# Patient Record
Sex: Male | Born: 1935 | Race: White | Hispanic: No | Marital: Married | Smoking: Former smoker
Health system: Southern US, Community
[De-identification: ages and names within clinical notes are randomized; demographics above are authoritative.]

## PROBLEM LIST (undated history)

## (undated) DIAGNOSIS — J189 Pneumonia, unspecified organism: Secondary | ICD-10-CM

## (undated) DIAGNOSIS — G8929 Other chronic pain: Secondary | ICD-10-CM

## (undated) DIAGNOSIS — M1990 Unspecified osteoarthritis, unspecified site: Secondary | ICD-10-CM

## (undated) DIAGNOSIS — M545 Low back pain, unspecified: Secondary | ICD-10-CM

## (undated) DIAGNOSIS — Z87442 Personal history of urinary calculi: Secondary | ICD-10-CM

## (undated) DIAGNOSIS — F0390 Unspecified dementia without behavioral disturbance: Secondary | ICD-10-CM

## (undated) DIAGNOSIS — Z95810 Presence of automatic (implantable) cardiac defibrillator: Secondary | ICD-10-CM

## (undated) DIAGNOSIS — R3911 Hesitancy of micturition: Secondary | ICD-10-CM

## (undated) DIAGNOSIS — E785 Hyperlipidemia, unspecified: Secondary | ICD-10-CM

## (undated) DIAGNOSIS — I6529 Occlusion and stenosis of unspecified carotid artery: Secondary | ICD-10-CM

## (undated) DIAGNOSIS — I2510 Atherosclerotic heart disease of native coronary artery without angina pectoris: Secondary | ICD-10-CM

## (undated) DIAGNOSIS — K219 Gastro-esophageal reflux disease without esophagitis: Secondary | ICD-10-CM

## (undated) DIAGNOSIS — I10 Essential (primary) hypertension: Secondary | ICD-10-CM

## (undated) HISTORY — PX: CATARACT EXTRACTION, BILATERAL: SHX1313

## (undated) HISTORY — PX: CORONARY ANGIOPLASTY: SHX604

## (undated) HISTORY — PX: CARDIAC CATHETERIZATION: SHX172

## (undated) HISTORY — PX: BACK SURGERY: SHX140

## (undated) HISTORY — PX: KIDNEY STONE SURGERY: SHX686

## (undated) HISTORY — PX: KNEE SURGERY: SHX244

## (undated) HISTORY — DX: Hyperlipidemia, unspecified: E78.5

## (undated) HISTORY — DX: Essential (primary) hypertension: I10

## (undated) HISTORY — PX: CARDIAC PACEMAKER PLACEMENT: SHX583

## (undated) HISTORY — DX: Atherosclerotic heart disease of native coronary artery without angina pectoris: I25.10

## (undated) HISTORY — PX: OTHER SURGICAL HISTORY: SHX169

## (undated) HISTORY — PX: TONSILLECTOMY: SUR1361

## (undated) HISTORY — PX: LUMBAR DISC SURGERY: SHX700

## (undated) HISTORY — DX: Occlusion and stenosis of unspecified carotid artery: I65.29

---

## 1999-03-09 ENCOUNTER — Ambulatory Visit (HOSPITAL_COMMUNITY): Admission: RE | Admit: 1999-03-09 | Discharge: 1999-03-10 | Payer: Self-pay | Admitting: Cardiovascular Disease

## 1999-03-09 HISTORY — PX: CARDIAC CATHETERIZATION: SHX172

## 1999-07-13 ENCOUNTER — Observation Stay (HOSPITAL_COMMUNITY): Admission: RE | Admit: 1999-07-13 | Discharge: 1999-07-14 | Payer: Self-pay | Admitting: Cardiovascular Disease

## 1999-07-13 ENCOUNTER — Encounter: Payer: Self-pay | Admitting: Cardiovascular Disease

## 1999-07-13 HISTORY — PX: OTHER SURGICAL HISTORY: SHX169

## 2003-01-22 ENCOUNTER — Ambulatory Visit (HOSPITAL_COMMUNITY): Admission: RE | Admit: 2003-01-22 | Discharge: 2003-01-23 | Payer: Self-pay | Admitting: Cardiovascular Disease

## 2003-01-22 HISTORY — PX: OTHER SURGICAL HISTORY: SHX169

## 2004-10-20 ENCOUNTER — Ambulatory Visit (HOSPITAL_COMMUNITY): Admission: RE | Admit: 2004-10-20 | Discharge: 2004-10-20 | Payer: Self-pay | Admitting: Cardiovascular Disease

## 2004-10-20 HISTORY — PX: ICD GENERATOR CHANGE: SHX5854

## 2008-03-30 ENCOUNTER — Encounter: Admission: RE | Admit: 2008-03-30 | Discharge: 2008-03-30 | Payer: Self-pay | Admitting: Orthopedic Surgery

## 2008-04-15 ENCOUNTER — Encounter (INDEPENDENT_AMBULATORY_CARE_PROVIDER_SITE_OTHER): Payer: Self-pay | Admitting: Orthopedic Surgery

## 2008-04-15 ENCOUNTER — Ambulatory Visit (HOSPITAL_BASED_OUTPATIENT_CLINIC_OR_DEPARTMENT_OTHER): Admission: RE | Admit: 2008-04-15 | Discharge: 2008-04-15 | Payer: Self-pay | Admitting: Orthopedic Surgery

## 2008-04-17 ENCOUNTER — Emergency Department (HOSPITAL_COMMUNITY): Admission: EM | Admit: 2008-04-17 | Discharge: 2008-04-17 | Payer: Self-pay | Admitting: Emergency Medicine

## 2009-08-16 ENCOUNTER — Encounter: Admission: RE | Admit: 2009-08-16 | Discharge: 2009-08-16 | Payer: Self-pay | Admitting: Cardiovascular Disease

## 2010-04-12 LAB — BASIC METABOLIC PANEL
BUN: 39 mg/dL — ABNORMAL HIGH (ref 6–23)
CO2: 28 mEq/L (ref 19–32)
Calcium: 9.3 mg/dL (ref 8.4–10.5)
GFR calc non Af Amer: 43 mL/min — ABNORMAL LOW (ref >60)
Glucose, Bld: 167 mg/dL — ABNORMAL HIGH (ref 70–99)

## 2010-04-12 LAB — POCT I-STAT 4, (NA,K, GLUC, HGB,HCT): HCT: 46 % (ref 39.0–52.0)

## 2010-05-16 NOTE — Op Note (Signed)
NAMESANDER, MCCRIMON             ACCOUNT NO.:  [id number]   MEDICAL RECORD NO.:  [id number]          PATIENT TYPE:  AMB   LOCATION:  [facility]                         FACILITY:  [facility]   PHYSICIAN:  Tarri Glenn, M.D.  DATE OF BIRTH:  09-21-1934   DATE OF PROCEDURE:  04/15/2008  DATE OF DISCHARGE:                               OPERATIVE REPORT   PREOPERATIVE DIAGNOSES:  1. Large Baker cyst.  2. Suspected tear, medial meniscus.  3. Osteoarthritis, right knee.   POSTOPERATIVE DIAGNOSES:  1. Large Baker cyst.  2. Suspected tear, medial meniscus.  3. Osteoarthritis, right knee.  4. Torn lateral meniscus, right knee.   OPERATION:  Right knee arthroscopy with:  1. Partial medial and lateral meniscectomy.  2. Shaving of medial femoral condyle.  3. Open excision of Baker cyst, right knee.   SURGEON:  Tarri Glenn, M.D.   ASSISTANT:  Nurse.   ANESTHESIA:  General.   PATHOLOGY AND JUSTIFICATION FOR PROCEDURE:  He has a very painful right  knee with plain x-ray showing some medial compartment narrowing.  He  also had a large mass over the posterior medial and medial aspects of  his knee.  He has a pacemaker, so we were unable to get an MRI.  CT scan  demonstrated the mass to be a Baker cyst, and a probable tear of the  medial meniscus.  Consequently, he is here today for the above-mentioned  surgical procedures.   PROCEDURE:  Prophylactic antibiotics, satisfactory general anesthesia,  Ace wrap and knee support to left lower extremity.  Pneumatic tourniquet  applied to right lower extremity with the leg esmarched out  nonsterilely, and tourniquet inflated to 300 mmHg.  Thigh stabilizer  applied, right leg was then prepped from stabilizer to ankle with  DuraPrep and draped in a sterile field.  Superior medial saline inflow.  First through an anteromedial portal, lateral compartment of knee joint  was evaluated.  He had a little bit of reactive synovitis which I  resected with the  3.5 shaver.  Joint surfaces had minimal wear.  He did  have considerable fraying of the entire inner border of the lateral  meniscus, which I pictured and shaved down until smooth with a 3.5  shaver, with final pictures being taken.  Looking up in the lateral  gutter and suprapatellar area, minimal wear of the patella, and nothing  needed to be done arthroscopically.  I then reversed portals.  He had a  moderate amount of synovitis which I resected with 3.5 shaver.  This  allowed better visualization of the joint.  He had full thickness wear  of the posterior medial tibial plateau and grade 2-3/4 chondromalacia of  the medial femoral condyle, which I shaved down with the 3.5 shaver.  He  had a degenerative type tear of the entire posterior half of the medial  meniscus, which I treated with a combination of baskets and a 3.5  shaver, resecting it back to a stable rim.  I then removed all fluid  possible from the knee joint and closed the 2 entry portals with 4-0  nylon.  Through  the inflow apparatus I injected 20 mL of 0.5% Marcaine  with adrenaline and 4 mg of morphine and then closed this portal with 4-  0 nylon as well.  Tourniquet was released.  We then brought stretcher  into the operating room and gently moved him to the stretcher and placed  rolls on the operating room table, and then placed him back on the  operating room table in the prone position.  The tourniquet had been  released this entire time and was reapplied with the nozzle posteriorly,  and we then esmarched out the leg again nonsterilely, and inflated the  tourniquet to 300 mmHg.  We then prepped the right leg from tourniquet  to ankle with DuraPrep and draped in a sterile field.  A time-out was  performed before the first procedure for both procedures.  I then  started out with a small posterior medial incision, and when the cyst  became visible, I had to extend it proximally and distalward to get to  the extent of the  cyst.  Neurovascular structures and tendons were  protected and retracted.  First with sharp dissection and then with  digital dissection, I was able to dissect out the mass from inferiorly,  working superiorly, down to its entrance to the knee joint, and at this  point the mass deflated.  It was clearly a benign cyst.  The apertures  to the knee joint were identified and I closed them with multiple  interrupted 0 Vicryl.  I then continued dissecting the mass out  superiorly from the surrounding tissues, and I found no other exit area  from the knee joint and also found no other additional mass present.  The specimen was sent to pathology.  I then released the tourniquet.  There was no unusual bleeding.  I closed the subcutaneous tissue with a  combination of 0 and 2-0 Vicryl, with interrupted 4-0 nylon mattress  sutures in the skin.  Medium Adaptic dry sterile dressing applied to  both anterior and posterior knee with a well-padded dressing followed by  an Ace wrap.  He was then placed on his back and in a knee immobilizer.  He tolerated the procedure well and was taken to the recovery room in  satisfactory condition with no known complications.           ______________________________  Tarri Glenn, M.D.     JA/MEDQ  D:  04/15/2008  T:  04/15/2008  Job:  [id number]

## 2010-05-19 NOTE — Cardiovascular Report (Signed)
[place]  Patient:    DARYEL, ARROYOS                    MRN: [id number] Proc. Date: 03/09/99 Adm. Date:  [id number] Attending:  Epifanio Lesches CC:         Richard A. Rollene Fare, M.D.             CP Laboratory             Varney Baas, M.D., [facility], [place]                        Cardiac Catheterization  PROCEDURE:  Retrograde central aortic catheterization, selective coronary angiography via Judkins technique, LV angiogram RAO/LAO projection, subselective LIMA/RIMA and left subclavian injection, abdominal aortic angiogram midstream PA projection, atherectomy with IVT cutting balloon high-grade made and proximal RCA stenosis under Aggrastat bolus plus infusion, weight-adjusted heparin and p.o. Plavix.  BRIEF HISTORY:  Mr. 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  He is married with three children and four grandchildren.  He is a medical patient of Dr. Debria Garret and had TVP for sick sinus syndrome with syncope June 28, 1993 ([employer] [facility]). Recent episodes of chest pain compatible with ischemia.  Outpatient Cardiolite positive for inferior ischemia (February 22, 1999) prompting referral for catheterization.  The patient generally overrides his pacemaker set in the LRL of 65 per minute. He stopped smoking one year ago.  He was premedicated as an outpatient with Plavix and has been on chronic aspirin therapy.  The patient is admitted, this is a same day admission, in a post absorptive state. ______ dye was used throughout the procedure.  The above procedure was performed initially through a 6 [place] short Terumo sidearm sheath placed with  single anterior puncture into the RFA under 1% Xylocaine anesthesia. Diagnostic angiography was done with 6 French 4 cm taper of Cordis free form coronary and  pigtail catheters.  Subselective LIMA, RIMA, and  left subclavian injection done  with the right coronary and LV angiogram done at 25 cc/14 cc per second RAO and 20 cc/12 cc per second LAO projection through the pigtail catheter.  Pullback pressures PA showed no gradient across the aortic valve.  Abdominal aortic angiogram in a midstream PA projection was done above the level of the renal arteries at 25 cc/20 cc per second.  The patient tolerated diagnostic procedure well with no chest pain or arrhythmia.  PRESSURES:  LV 108/0; LVP 16 mmHg.  CA:  108/60 mmHg.  There was no gradient across the aortic valve on catheter pullback.  FLUOROSCOPY:  Fluoroscopy did not reveal any significant coronary, intracoronary, for valvular calcification  LV angiogram in the RAO and LAO projection showed mild hypokinesis of the posteroapical segment with otherwise normal and contracting ventricle with the patient in sinus rhythm overriding his pacer and an EF of greater than 50%. There was no significant mitral regurgitation with sinus beats.  The main left coronary was normal.  The left anterior descending had thinning from the mid portion to the apex but o significant stenosis.  I could not rule out atherosclerotic disease in this area but it was non-obstructive.  The left coronary system was relatively small at  2.5 mm size.  The LAD gave off a diagonal from the proximal and from the mid portion of moderate size that were normal.  There was an optional diagonal vessel that was normal of moderate size.  The circumflex gave off a thin first marginal and then the remainder of the vessel was a moderate size with no significant stenosis with an atrial branch and PAVG  branch from the mid portion.  The right coronary was a dominant vessel.  There was a shelf-like stenosis of 75%, fairly focal, in the proximal third just before the bend at the level of a conus branch.  There was a 40% smooth narrowing from the junction of the  proximal third to the mid portion of the vessel, probably representing diffuse atherosclerotic  disease with smooth good residual lumen of approximately 2.7-3.0 mm.  There was a high-grade 95% eccentric stenosis of the RCA just before the acute margin of less than 10 mm in length.  The PDA and PLA were normal, slightly underfilled.  There was TIMI-3 flow to the vessel and no thrombus was seen.  The RIMA and LIMA were widely patent; however, there was a concentric diffuse 75-85% stenosis of the left subclavian artery.  The left renal artery had 60-70% segmental stenosis proximally and was single. The right renal artery was normal.  The right brachiocephalic was normal.  The infrarenal abdominal aorta had moderate atherosclerotic disease and the proximal iliacs were patent.  DISCUSSION:  The patient also has a known carotid bruit, which will need evaluation and is asymptomatic.  The patients culprit lesion is probably his proximal and mid RCA stenoses, particularly the mid 95% lesion.  It was elected to proceed with PCA and informed consent was obtained from the patient and family.  The patient was given weight-adjusted heparin of 3200 units and Plavix 75.  He as started on Aggrastat bolus plus infusion.  The sheath was upgraded to a 7 [place] short Terumo sidearm sheath and the right  coronary was intubated with a ______ 7 [place] side hole guide.  The lesions crossed with an HTF 0.018 inch wire.  The distal lesion was crossed easily with a 3.25/10 IVT cutting balloon.  Graded inflations were done over the distal lesion under fluoroscopic control at 6-64, 6-41, 6-52, and 6-53.  Injections were taken post IC nitroglycerin administration showing a good angiographic result with no  dissection and TIMI-3 flow.  The patient had significant ST elevation with inflations, promptly relieved with balloon deflations and mild chest pain associated with this.  We then approached the  proximal lesion.  We initially started with a 2.75/10 IVT  cutting balloon and inflation was done at 6-65.  There was residual stenosis and the vessel appeared slightly larger, so we went ahead and upgraded to a 3.0/10 VT cutting balloon and dilated at 6 atmospheres for 83 seconds.  The balloon was removed.  Final injections were performed in the RAO and LAO projection.  The patient tolerated the procedure well.  The dilatation system was removed. Sidearm sheath was flushed.  The patient was brought to the holding area for a CT measurement.  We plan to discontinue his sheath today, discontinue heparin, continue Aggrastat for 18 hours, and aspirin and Plavix postprocedure. Continue medical therapy, careful follow-up of lipids.  The patient had stopped smoking  year ago.  We will get outpatient renal Dopplers, upper extremity Dopplers for is subclavian and renal artery stenosis, and carotid Dopplers for carotid bruit that is asymptomatic.  FINAL ANGIOGRAMS: 1. The mid RCA stenosis was reduced from eccentric 95% to less than 20% and smooth    with no dissection and  good TIMI-3 flow. 2. The proximal RCA was reduced from greater than 70% to less than 10% with no    dissection and good TIMI-3 flow.  CATHETERIZATION DIAGNOSES: 1. Arteriosclerotic heart disease--new onset angina with positive Cardiolite. 2. High-grade single-vessel disease.    a. Successful cutting balloon atherectomy mid right coronary artery.    b. Successful cutting balloon atherectomy proximal right coronary artery.    c. Well-preserved left ventricular function. 3. Status post permanent transvenous pacemaker for recurrent syncope, sick sinus    syndrome, bradycardic vasodepressor and bradycardic component June 28, 1993. 4. Asymptomatic carotid bruit. 5. Past history of ureteral lithiasis. 6. Chronic obstructive pulmonary disease. DD:  03/09/99 TD:  03/09/99 Job: [id number]

## 2010-05-19 NOTE — Op Note (Signed)
Thomas Snow, Thomas Snow             ACCOUNT NO.:  [id number]   MEDICAL RECORD NO.:  [id number]          PATIENT TYPE:  OIB   LOCATION:  2853                         FACILITY:  [facility]   PHYSICIAN:  Richard A. Rollene Fare, M.D.DATE OF BIRTH:  February 26, 1934   DATE OF PROCEDURE:  10/20/2004  DATE OF DISCHARGE:                                 OPERATIVE REPORT   PROCEDURES:  1.  Explantation of end-of-life Intermedics Cosmos-II (Model No. 2A3-03)      pulse generator (Serial No. [id number]; DOI June 28, 1993).  2.  Implantation of new [place] ADX-XLDR (Model No. 5357M/S;      Serial No. [id number]).   IMPLANTING PHYSICIAN:  Richard A. Rollene Fare, M.D.   COMPLICATIONS:  None.   ESTIMATED BLOOD LOSS:  Approximately 20 cc.   ANESTHESIA:  Valium 5 mg p.o. premedication; with 2 mg Versed, 25 mcg of  fentanyl IV for sedation.  Local Xylocaine 1%.   PREOPERATIVE ANTIBIOTIC PROPHYLAXIS:  Ancef 1 g IV; planned 1 g  postoperatively IV.   COMPLICATIONS:  None.   EQUIPMENT:  1.  Ventricular electrode (DOI: July 24, 1983) Intermedics 5 mm unipolar      atrial electrode (Model No. 483-04; Serial No. [id number].  2.  Ventricular electrode: Intermedics (Model No. 487-05; Serial No.      [id number]).  Both are implanted July 24, 1983.   PREOPERATIVE DIAGNOSES:  1.  Status post permanent transvenous demand pacemaker DDD Cosmos (July 24, 1983) for sick sinus syndrome sinus node disease with syncope --      resolved.  2.  End-of-life generator change June 28, 1993 Cosmos-II.  3.  ERI on pulse generator with reversion to VVI mode; non-rate-responsive      generator.  4.  Left subclavian stent July 2001; left renal artery stent for renal      vascular hypertension July 13, 1999 -- peripheral arterial disease.  5.  Coronary artery disease with prior percutaneous intervention on March 09, 1999; high-grade RCA stenosis treated with cutting balloon atherectomy.  6.  Bilateral carotid stenosis;  asymptomatic, moderate degree on Doppler      surveillance.  7.  Hyperlipidemia, on therapy.  8.  Arthritis of the back and spine, chronic and slowly progressive.   POSTOPERATIVE DIAGNOSES:  1.  Status post permanent transvenous demand pacemaker DDD Cosmos (July 24, 1983) for sick sinus syndrome sinus node disease with syncope --      resolved.  2.  End-of-life generator change June 28, 1993 Cosmos-II.  3.  ERI on pulse generator with reversion to VVI mode; non-rate-responsive      generator.  4.  Left subclavian stent July 2001; left renal artery stent for renal      vascular hypertension July 13, 1999 -- peripheral arterial disease.  5.  Coronary artery disease with prior percutaneous intervention on March 09, 1999; high-grade RCA stenosis treated with cutting balloon atherectomy.  6.  Bilateral carotid stenosis; asymptomatic, moderate degree on Doppler  surveillance.  7.  Hyperlipidemia, on therapy.  8.  Arthritis of the back and spine, chronic and slowly progressive.   SOCIAL HISTORY:  Tobacco chewer.  Father of three; four grandchildren.  Recently retired Systems analyst business.   PROCEDURE:  The patient was admitted as a same-day admission, with normal  preoperative laboratory. Informed consent was obtained to proceed with  elective generator replacement.  Cholesterol was 110, HDL 30, LDL 29.  Normal CNP, LFTs, CBC, differential and coag studies. Aspirin and Plavix was  held for 2 days preoperatively.   The patient was brought to the second floor CP lab in a postabsorptive  state. He was given 1 g of Ancef IV antibiotic prophylaxis, with 5 mg of  Valium p.o. premedication. The right anterior chest was prepped, draped in  the usual manner. Xylocaine 1% was used for local anesthesia. Fluoroscopy  showed good position of the previously implanted generator and atrial and  ventricular electrodes. There was no evidence of any conductor or insulation  defects  on interrogation or fluoroscopic visualization.   A right infraclavicular transverse incision was performed over the superior  previously placed scar, under 1% Xylocaine anesthesia. The incision was  brought down to the fibrous capsule using blunt dissection and careful  electrocautery to control hemostasis. The fibrous capsule was incised.  The  pulse generator was freed up using blunt dissection, and delivered from the  pocket with a single-knot silk retention suture completely removed. The  anterior fibrous capsule was moderately thick, noncalcified and it was  debrided and the pocket required minor revision. Hemostasis was controlled.  The pocket was irrigated with 500 mg of kanamycin solution. Unipolar atrial  and ventricular thresholds were recorded after the electrodes were removed  from the pulse generator, with the single hex nut opened for each electrode.  The electrodes were 5 mm pin connectors. They appeared to be intact  bilaterally, with no significant defects noted.  The pin connectors were  intact. There was no blood in the leads. There was excellent unipolar atrial  and ventricular chronic thresholds for these leads, and nominal impedances.  The new generator was hooked to the electrodes in the proper AV sequence,  with the single hex nut tightened for each electrode. The generator was  delivered into the pocket,  with the electrodes looped behind and loosely  secured to the posterior fibrous capsule underlying muscle, with a #1 silk  suture to prevent migration. Sponge count was correct. The subcutaneous  tissue was closed in 2 separate running layers of 2-0 Vicryl suture, and the  skin was closed with 4-0 subcuticular Vicryl sutures. Steri-Strips were  applied. Fluoroscopy showed good position of the generator; electrodes  looped behind and pin connectors. Steri-Strips were applied. A soft bulky dressing was applied. The patient was transferred to the holding area for   postoperative care, in stable condition.   SETTINGS:  1.  UNIPOLAR ATRIAL THRESHOLD: P 3.5 MV,  Resistance 548 ohms, Threshold 1 V      at 0.59 msec. MA 2.2.  2.  UNIPOLAR VENTRICULAR: R waves 12.0 mV.  Resistance 585 ohms.  Threshold      1.5 V at 0.5 msec.  MA 3.4.  3.  MAGNET RATE:  BOL 98.5/min.  EOL magnet rate drops to 86.3/min.      Richard A. Rollene Fare, M.D.  Electronically Signed     RAW/MEDQ  D:  10/20/2004  T:  10/21/2004  Job:  [id number]   cc:  Pacemaker lab, second floor CP   Pacemaker lab, Dr. Lowella Fairy office   Dr. Radene Gunning office

## 2010-05-19 NOTE — Cardiovascular Report (Signed)
[facility]  Patient:    Thomas Snow, Thomas Snow                      MRN: [id number] Proc. Date: 07/13/99 Attending:  Delfino Lovett A. Rollene Fare, M.D. CC:         Richard A. Rollene Fare, M.D.             Lorretta Harp, M.D.             [facility]                        Cardiac Catheterization  OFFICE RECORD NUMBER:  [id number].  PROCEDURE:  Retrograde aortic root catheterization; aortic arch angiogram, left anterior oblique projection; abdominal aortic angiogram, midstream posteroanterior projection; selective right brachiocephalic angiogram; selective left subclavian angiogram; bilateral selective renal angiography; transstenotic left renal artery gradient measurement; percutaneous transluminal angioplasty and subsequent stent, high-grade left subclavian stenosis, for symptomatic left upper extremity claudication and documented left vertebral steal with stenting for post optimal percutaneous transluminal angioplasty result; percutaneous transluminal angioplasty, left renal artery, and subsequent stenting for suboptimal percutaneous transluminal angioplasty result, left renal ostial proximal stenosis.  BRIEF HISTORY:  Thomas Snow is a 76 year old white married father of three with three grandchildren.  He has recently discontinued smoking.  He has a history of hyperlipidemia, coronary artery disease and prior ______.  His last generator was June 28, 1993 (Cosmos II), [employer].  He underwent PTCA and stenting of high-grade 95% eccentric RCA stenosis, mid and high-grade proximal RCA stenosis, March 09, 1999, for angina and a positive Cardiolite. He subsequently had a negative followup Cardiolite, June 28, 1999, and he has been angina-free.  At the time of catheterization, he was noted to have high-grade left renal artery stenosis with systemic hypertension and high-grade left subclavian stenosis with poor antegrade left vertebral flow. He has symptomatic  left upper extremity claudication, systemic hypertension and baseline creatinine is 1.5.  Outpatient Dopplers shows a left renal artery velocity of 260 cm/sec with the renal-aortic ratio of 3.0; however, there was decrease size of the left kidney to 9.9 cm and normal right renal velocities.  Left upper extremity Dopplers showed monophasic left upper extremity wave forms with a 30-mm pulse deficit between the right and left upper extremities and negative carotid Dopplers.  DESCRIPTION OF PROCEDURE:  Informed consent was obtained to proceed with diagnostic angiography and intervention.  The patient was in the post-absorptive state.  He received Valium 5 mg p.o. premedication and Nubain 2 mg in the lab for sedation.  Right groin was prepped, draped in the usual manner, 1% Xylocaine was used, the RFA was entered with a single anterior puncture using an 18 thin-walled needle and initially a 6-French Cordis side-arm sheath was inserted without difficulty.  A Wholey wire was used to traverse the iliac system and a guidewire exchange was used throughout the procedure.  Patient was given 3000 units of intravenous heparin.  A pigtail catheter was positioned in the aortic arch and in the LAO shallow cranial projection, aortic arch angiography was performed at 30 cc, 20 cc/sec. Arterial pressures were monitored throughout the procedure and ranged 140 to 160 mmHg, with the patient A-V pacing with full capture.  Atrial and ventricular pacing wires were noted to be in good position in the right atrium and RV.  Aortic arch angiogram demonstrated widely patent right brachiocephalic with about 123456 narrowing in the proximal third.  There was a  patent left common carotid with no proximal stenosis and a high-grade stenosis of the proximal left subclavian.  The right common carotid had good flow from the right brachiocephalic.  A 5-French pigtail catheter was pulled down above the level of the  renal arteries and abdominal aortic angiogram was done in the midstream PA projection.  This demonstrated patent celiac and SMA access and patent IMA.  There is moderately severe infrarenal atherosclerotic disease with mild ulceration in the distal third but no aneurysm formation or significant stenosis and a relatively small abdominal aorta.  The right renal artery showed good flow but there was overlap with the SMA so the ostia were not visualized.  The left renal artery demonstrated greater than 70% proximal stenosis.  The hypogastrics were intact bilaterally.  There was 30% right common iliac narrowing and 40% distal left common iliac narrowing.  The iliacs were visualized down to the proximal external iliacs, which appeared normal, and there appeared to be good runoff.  Selective right renal artery showed a widely patent normal single right renal artery.  Selective left renal artery showed a 75-80% ostial and proximal stenosis, extending up and ending before the anterior and posterior division bifurcations, with mild calcification.  The stenosis was mostly concentric and there was a 40 mmHg gradient across the stenosis with an end-hole catheter and good wave forms on catheter pullback.  Right brachiocephalic injection was done with a long 6-French coronary catheter.  This demonstrated a patent RIMA, no significant brachiocephalic stenosis and a large right vertebral.  On delayed imaging, there was retrograde filling of the left vertebral down to the left subclavian, compatible with vertebral steal.  Selective left subclavian angiography revealed 85% concentric stenosis, ending before the left vertebral with poor antegrade left vertebral flow and a patent LIMA and thyrocervical branch.  The left subclavian artery had been intubated with a multipurpose 6-French catheter.  The high-grade stenosis was crossed with a 0.035-inch Wholey wire. The catheter was removed and the 6-French  sheath was exchanged for a long  7-French Cordis side-arm sheath.  This was advanced under fluoroscopic control to the left subclavian artery.  The left subclavian was then dilated with a 5-mm x 2-cm Cordis Power-Flex balloon at 6 ATM for 40 seconds.  The balloon was pulled back and injection showed restoration of antegrade left vertebral flow, the LIMA was intact and there was residual 70% stenosis with suboptimal angioplasty results; for this reason, it was elected to proceed with stenting. The sheath was placed across the stenosis.  A P-204 iliac stent was hand-crimped on a Cordis Power-Flex 7-mm x 2-cm balloon and the stent/balloon combination was positioned in the proximal subclavian, being careful to be proximal to the left vertebral and cover the lesion.  The sheath was pulled back and the stent was expanded at 8 atmospheres for 40 seconds.  The balloon was pulled back and final injection showed stenosis reduction of 85% to 0 with excellent antegrade left vertebral flow with a normal left vertebral artery and patent LIMA.  The stent ended in the proximal third of the left subclavian, with good backflow into the aorta.  The guidewire was pulled back and the 7-French long sheath was exchanged for a short 8-French sheath.  A double-renal-curved Cordis 8-French guiding catheter was used to intubate the left renal artery.  The high-grade left renal stenosis was crossed with a 0.035-inch Wholey wire.  The lesion was dilated with a 4.0-mm x 1.5-cm Cordis Power-Flex balloon at 4 atmospheres for  40 seconds, the balloon pulled back and injections showed residual stenosis with suboptimal result, particularly at the ostia, so it was elected to proceed with stenting.  A PQ-154 Cordis balloon expandable stent was hand-crimped onto a 6.0-mm x 1.5-cm Cordis Power-Flex balloon.  The stent/balloon combination was positioned across the stenosis, with the guiding catheter used a covering sheath.   The guiding catheter was pulled back, and with precise positioning, the stent was deployed at 6 atmospheres for 40 seconds.  The balloon was pulled back and final injections revealed excellent result, with stenosis reduction of 80% to 0, with no dissection and excellent position of the stent cover the ostia, approximately 1 mm into the aorta.  The catheter and guidewire were removed.  Side-arm sheath was flushed and the patient was brought to the holding area for ACT measurements and side-arm sheath removal with pressure hemostasis.  He tolerated the procedure well.  Patient will have followup renal function and outpatient followup renal and left upper extremity Dopplers.  He should have excellent relief of his left upper extremity claudication and hopefully renal PTA and subsequent stenting for suboptimal result will help preserve renal function and help with his blood pressure control, particularly in view of a small left kidney, which will be followed.  Continued management of his hyperlipidemia and associated coronary disease.  CATHETERIZATION DIAGNOSES:  1. Left upper extremity claudication.  2. Angiographic left vertebral steal with high-grade left subclavian     stenosis.  3. Successful percutaneous transluminal angioplasty and stenting, high-grade     left subclavian stenosis.  4. Systemic hypertension with small left kidney; creatinine 1.5.  5. Successful percutaneous transluminal angioplasty and stenting, high-grade     ostial and proximal left renal artery stenosis, for suboptimal     percutaneous transluminal angioplasty result.  6. Status post right coronary artery proximal and mid percutaneous     transluminal coronary angioplasty and stenting for angina and positive     Cardiolite, March 09, 1999, with negative followup Cardiolite for ischemia,     June 28, 1999.  7. ______ for symptomatic sick sinus syndrome with symptomatic bradycardia,     vasodepressor and  bradycardic component; last end-of-life generator     change, June 28, 1993.  8. Asymptomatic left carotid bruit without significant stenosis on Doppler.  9. Hyperlipidemia. 10. Chronic obstructive pulmonary disease, recent discontinuation ______ . 11. Ureterolithiasis. DD:  07/13/99 TD:  07/13/99 Job: [id number]

## 2010-05-19 NOTE — Discharge Summary (Signed)
[name], Thomas Snow NO.:  [id number]   MEDICAL RECORD NO.:  [id number]                   PATIENT TYPE:  OIB   LOCATION:  4705                                 FACILITY:  [facility]   PHYSICIAN:  Erlene Quan, P.A.                DATE OF BIRTH:  14-Nov-1934   DATE OF ADMISSION:  01/22/2003  DATE OF DISCHARGE:  01/23/2003                                 DISCHARGE SUMMARY   DISCHARGE DIAGNOSES:  1. Peripheral vascular disease, left subclavian artery and left renal artery     angioplasty this admission.  2. History of coronary disease, right coronary artery intervention March     2001.  3. History of permanent pacemaker.  4. Treated hyperlipidemia.  5. Renal insufficiency, creatinine 1.6 at discharge.  6. Chronic obstructive pulmonary disease and past history of smoking.   HOSPITAL COURSE:  The patient is a 76 year old male followed by Dr.  Rollene Fare and Dr. Geoffery Lyons who has a history of coronary disease.  He had a  cutting balloon to the right coronary artery in March 2001.  He had a  Cardiolite study in April 2003 that showed no significant ischemia.  He has  had left subclavian and renal artery stenting in July 2001.  He has  bilateral carotid bruits, left greater than right.  He has had Dopplers  showing a 50-79% narrowing in his carotids and also to and fro velocity  suggesting restenosis in his left subclavian stent.  He has had some vague  left upper extremity weakness.  He was admitted for diagnostic angiogram to  rule out significant restenosis and also to look at his carotid arteries and  renal arteries.  He underwent catheterization by Dr. Rollene Fare on January 22, 2003.  He had a less than 50% right internal carotid artery stenosis and  less than 30% left internal carotid artery stenosis.  He had left vertebral  steel from vertebral circulation.  He had 85% in-stent stenosis to the left  subclavian artery with a greater than 25 mm gradient.   The left renal had an  80% in-stent restenosis with a greater than 70 mm gradient.   He underwent intervention to the left subclavian artery with good result and  also the left renal artery.  He was put on Plavix and aspirin.  Postoperatively, his creatinine went from 1.2 to 1.6.  We did give him extra  IV fluids and held his Altace for 48 hours.  He will have a followup BMP  next week as well as followup renal and subclavian artery Dopplers.   DISCHARGE MEDICATIONS:  1. Plavix 75 mg a day.  2. Coated aspirin once a day.  3. Toprol XL 25 mg every day.  4. Altace 2.5 mg a day has been held for the next 48 hours, and his Zocor     was increased to 20 mg a day.   LABORATORY DATA:  Discharge white count 10.4, hemoglobin 14.9, hematocrit  42.3, platelets 225,000.  Sodium 140, potassium 3.8, BUN 18, creatinine 1.6.   DISPOSITION:  The patient is discharged in stable condition.  He will have  followup renal artery and subclavian artery Dopplers.  We have held his ACE  inhibitor for 48 hours and will check a followup BMP Monday.                                                Erlene Quan, P.Timothy Lasso  D:  01/23/2003  T:  01/23/2003  Job:  [id number]

## 2010-05-19 NOTE — Discharge Summary (Signed)
[place]  Patient:    DINNIE, DUGGIN                    MRN: [id number] Adm. Date:  [id number] Disc. Date: [id number] Attending:  Epifanio Lesches Dictator:   Alroy Bailiff, P.A. CC:         Richard A. Rollene Fare, M.D., [facility]             Varney Baas, M.D.                           Discharge Summary  ADMISSION DIAGNOSES: 1. Throat burning, exertional, compatible with ischemic equivalent. 2. Positive Cardiolite for ischemia in the right coronary artery and left anterior    territory on February 22, 1999. 3. Status post permanent transvenous pacemaker July 24, 1983 for sinus arrest with    documented presyncope and recurrent syncope, vasopressor reaction. 4. End-of-life generator change, Cosmos II DDD pacemaker June 28, 1993. 5. Family history of coronary disease. 6. Cigarette abuse, discontinued one year ago. 7. Past history of ureteral lithiasis. 8. Mild chronic obstructive pulmonary disease.  DISCHARGE DIAGNOSES: 1. Throat burning, exertional, compatible with ischemic equivalent. 2. Positive Cardiolite for ischemic in the right coronary artery and left anterior    territory on February 22, 1999. 3. Status post permanent transvenous pacemaker July 24, 1983 for sinus arrest with    documented presyncope and recurrent syncope, vasopressor reaction. 4. End-of-life generator change, Cosmos II DDD pacemaker June 28, 1993. 5. Family history of coronary disease. 6. Cigarette abuse, discontinued one year ago. 7. Past history of ureteral lithiasis. 8. Mild chronic obstructive pulmonary disease. 9. Status post cardiac catheterization on March 09, 1999 by Dr. Terance Ice    with percutaneous transluminal coronary angioplasty to the right coronary    artery.  HISTORY OF PRESENT ILLNESS:  Mr. Salonen returned to the office on March 06, 1999 for follow up of his recent Cardiolite, which was performed on  February 22, 1999. The Cardiolite had been performed because of a family history of coronary artery disease, history of smoking discontinued one year ago.  As well, he had had some burning in his throat, which he had attributed to some recent exposure to paint  fumes.  He followed up with a Cardiolite on February 22, 1999.  At that time, he was overriding his pacemaker, so they did have interference with that and he developed mild substernal pressure and throat burning causing discontinuation of his exercise.  He was able to exercise for 8.5 minutes to 7 minutes.  The patient had significant ischemia in the RCA territory and mild ischemia in the LAD territory, suggesting multivessel disease.  He has felt better since that time but still had occasional substernal mild pressure and throat burning.  He had not had any rest or nocturnal episodes.  At that time, Dr. Rollene Fare planned for the patient to be admitted for diagnostic coronary angiography.  The procedure and risks were fully explained to the patient and he agreed to proceed with this plan.  HOSPITAL COURSE:  On March 09, 1999, Mr. Melrose underwent cardiac catheterization by Dr. Terance Ice.  He received heparin, as well as Aggrastat bolus and infusion, in addition to aspirin and Plavix.  He was found to have single-vessel coronary artery disease.  The cutting balloon was used for the mid RCA.  As well, he performed angioplasty to the proximal  RCA.  The proximal RCA went from a 70-75% stenosis to a less than 10% residual.  The mid RCA went from 95% stenosis to a ess than 20% residual.  Additionally, he was found to have normal LV function with EF greater than 50%. As well, he was found to have 70-80% left renal artery stenosis and an 85% left subclavian artery stenosis.  At that time, Dr. Rollene Fare planned to continue Aggrastat for 18 hours and continue aspirin and Plavix at time of discharge.  On the  morning of March 10, 1999, Mr. Maisano is stable.  He is doing well without any complaints after his intervention.  He is afebrile and has a blood pressure  systolic of 123XX123.  His groin has some ecchymosis and a very soft bruit but is stable.  He is felt to be stable for discharge home at this time.  CONSULTATIONS:  None.  PROCEDURES:  Cardiac catheterization on March 09, 1999 by Dr. Terance Ice ith percutaneous transluminal coronary angioplasty to the right coronary artery.  LABORATORY DATA:  Precatheterization labs:  Lipid profile with total cholesterol 184, triglycerides 209, HDL 33, LDL 109.  Urinalysis was negative.  LFTs also normal.  CBC completely normal.  BMP is normal except creatinine slightly elevated at 1.6.  Postcatheterization labs on March 10, 1999:  CBC totally normal.  BMP is also normal at this time with BUN 14, creatinine 1.3.  Cardiac enzymes on March 09, 1999 are also normal.  EKG on March 10, 1999 reveals normal sinus rhythm, 64 beats per minute with no ST or T wave changes.  DISCHARGE MEDICATIONS: 1. Enteric-coated aspirin 325 mg 1 p.o. q.d. 2. Plavix 75 mg once a day for one month. 3. Toprol XL 25 mg (1/2 of 50 mg tablet) once a day. 4. Prevacid 15 mg once a day for 1-2 months.  DISCHARGE INSTRUCTIONS:  No strenuous activity, lifting greater than 5 pounds, driving, or sexual activity for three days.  Low salt/low fat diet.  May gently  wash the groin with warm water and soap.  Call the office at [phone] if any bleeding or increased size or pain of the groin.  FOLLOW-UP:  At this time, we will schedule the patient for upper extremity Dopplers, as well as renal Dopplers, and then a followup appointment with Dr. Terance Ice in the office. DD:  03/10/99 TD:  03/11/99 Job: [id number]

## 2010-05-19 NOTE — Op Note (Signed)
NAMEKEMANI, WAGGY NO.:  [id number]   MEDICAL RECORD NO.:  [id number]                   PATIENT TYPE:  OIB   LOCATION:  2899                                 FACILITY:  [facility]   PHYSICIAN:  Richard A. Rollene Fare, M.D.          DATE OF BIRTH:  [birth year]   DATE OF PROCEDURE:  01/22/2003  DATE OF DISCHARGE:                                 OPERATIVE REPORT   PROCEDURE:  Retrograde abdominal aortic catheterization, abdominal aortic  angiogram mid stream PA projection, aortic arch angiogram LAO projection,  four vessel cerebral angiography via h and injection with DSA imaging,  selective left subclavian injection, left subclavian gradient measurement,  selective left renal artery angiogram, left renal artery gradient  measurement, PTA high grade in stent restenosis left subclavian artery with  symptomatic left subclavian vertebral steal, cutting balloon atherectomy,  subsequent PTA, subsequent sandwich stenting left renal ostia for inadequate  angioplasty result LRA, weight adjusted heparin 5000 units IV, platelets 300  p.o., continued aspirin therapy.   BRIEF HISTORY:  Mr. Gloe is a long term patient of mine.  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  He stopped smoking 3-4 years ago but chews tobacco.  He  has hyperlipidemia, renovascular hypertension, prior tandem RCA stenting  March 2001 with negative Cardiolite in April 2003.  Left subclavian and left  renal artery stenting for high grade stenosis July 2001 with symptomatic  left subclavian steal and renovascular hypertension.  He presents with  intermittent dizziness and left upper extremity discomfort with exertion  compatible with claudication and exacerbation of his hypertension on medical  therapy.  He also has remote PTVP for symptomatic sinus node dysfunction  July 1985 and EOL generator change with a Cosmos II  generator June 28, 1993,  now being followed closely for 76 end of life characteristics.   Outpatient Dopplers showed velocity of 240 in the left renal artery with a  renal aortic ratio greater than 2.  Upper extremity angiography revealed 300  cm per second velocity of left subclavian stent with to and fro left  vertebral flow compatible with steal.  Moderately severe left internal  carotid velocity elevation suggesting 50-79% stenosis and RICA velocity of  126 cm per second suggesting 50-70% narrowing.  Informed consent was  obtained to proceed with diagnostic study and peripheral intervention if  necessary.   The carotid study was done under the proctorship and direction of Dr. Christy Sartorius.   DESCRIPTION OF PROCEDURE:  The patient is brought to the 6th floor PV lab in  a postabsorptive state after 5 mg Valium p.o.  The right groin was prepped  and draped in the usual manner.  1% Xylocaine was used for local anesthesia.  The CRFA was entered through a single anterior puncture using an 18 thin  wall needle.  A 6 French short side arm sheath  was inserted without  difficulty.  A Wooley wire was used for catheter exchange.  A long pigtail  catheter was used for abdominal angiogram using DSA at 25 mL, 20 mL per  second, in the mid stream PA projection.  Aortic arch angiography was done  in the shallow LAO projection at 30 mL, 20 mL per second.  A long 5 French  right coronary catheter was used for four vessel angiography, first the  right carotid and PA both oblique and lateral projections including  intracranial angiography and then the left subclavian and vertebral in PA  and lateral projections and left carotid selectively in PA, oblique, and  lateral projections including intracranial pathology and followed through  venous flow.  These were done by hand injections without difficult.   Abdominal angiogram in the mid stream PA projection revealed patent iliac  and SMA access.  There was  moderate lumpy infrarenal atherosclerotic  disease without aneurysm or stenosis.  The IMA was intact.  The right renal  artery was single and normal.  The left renal artery stent was visualized  and demonstrated 80-85% ostial and proximal third to half ISR with  approximately 50% throughout the stent and 80-85% at the ostia and proximal  portion of the stent.  Trans-stenotic gradient with catheter measurement was  70 mmHg.   The left subclavian demonstrated 85% fairly focal in stent restenosis in the  proximal third of the stent that ended before the left vertebral and again  at the origin of the left subclavian.  The LIMA was antegrade and the left  vertebral showed sluggish antegrade flow compatible with steal phenomenon.   The common iliacs had irregularities with no significant stenosis  bilaterally.  The right hypogastric was totally occluded, the left  hypogastric was intact, and the external iliacs had no significant stenosis  bilaterally.   Aortic arch angiogram demonstrated significant stenosis of approximately 85%  in the proximal third of the left subclavian stent.  This was a type 1.5  arch with mild angulation.  No significant atherosclerotic disease of the  arch.  The RCCA was widely patent without significant stenosis.  The LCCA  had no stenosis at origin with good flow and there was antegrade right  vertebral flow.  The left subclavian beyond the vertebral had no significant  stenosis and the right subclavian had brachial cephalic and the subclavian  had no significant stenosis.  The RICA had an ulcerative 50% stenosis just  beyond its origin.  There was fairly normal appearing carotid bulb beyond  the stenosis and then 40% stenosis mildly segmental just beyond this.  The  remainder of the cervical right RICA appeared normal.  The RECA and its  branches appeared normal.  Intracranial views showed a 30-40% narrowing eccentrically of the  intracranial RICA just beyond  the cavernous and clinoid segment.  It was  essentially in the ophthalmic segment and fairly focal with good residual  lumen.  The intracranial vessels appeared normal and extended to the outer  cranium.  Dual venous return was normal.  There was some filling through the  anterior communicating of the left anterior cerebral from the right.  The  right vertebral was normal throughout its course and large.  There was cross  filling through the basilar artery of the distal left vertebral compatible  with steal phenomenon.  The intracranial vessels extended to the outer  cranium and venous return was normal.  The left vertebral was normal at its  origin  and then narrowed down with about 40% narrowing.  It was difficult to  tell if there was diffuse narrowing or this was relatively hypoplastic  because of the subclavian steal phenomenon.  The LIMA was intact.  It was  difficult to fill the left basilar artery in sub-selective left vertebral  injection because of the steal phenomenon related to the left subclavian  ISR.   The left common carotid was normal.  There were no irregularities and about  20-30% narrowing of the proximal LICA, mildly eccentric, there was no  ulceration and there was good flow.  The cervical LICA was, otherwise,  normal.  The LECA was normal and its branches were normal.  The left  intracranial carotid and its subdivisions appeared normal with some cross  filling through the anterior communicating to the right circulation.  The  intracranial carotid was normal.  The vessels extended to the outer cranium.  Venous return was normal.   Selective left renal angiogram demonstrated high grade left renal artery in  stent restenosis near the ostium and proximal.  There was a 70 mmHg gradient  across this.  We elected to proceed with intervention on the left subclavian  because of subclavian steal and symptomatic ISR.  The 5 French right  coronary catheter over the [facility] wire  was used to cross the left subclavian  artery.  The sheath was exchanged for a long 6 French 90 cm side arm sheath.  This was brought up to the origin of the left subclavian with the guide-wire  across this.  The subclavian was then dilated with the 7 x 2 cm Powerflex  balloon at 10-30 and 10-30.  The patient had been given 5000 units of  heparin monitoring ACT prior to crossing the lesion and 300 mg Plavix p.o.  The subclavian had final injections with balloon pull back and demonstrated  excellent angiographic result with less than 10% residual narrowing of the  left subclavian and elimination of the trans-stenotic gradient and much  improved antegrade left vertebral flow on hand injection.   We then approached the left renal artery.  The side arm for the long sheath  was exchanged for a short 6 [place] side arm sheath and a 6 [place] short right coronary guiding catheter was used.  The high grade left renal  stenosis was crossed with a 0.014 inch stabilizer Cordis guide-wire.  The  high grade stenosis was then dilated with a 4 mm x 15 mm coronary cutting  balloon at 10-30 and 12-30.  This was exchanged for a 6 mm x 2 cm Aviator  balloon, inflations were done at 6-15 and 10-30 and 10-30 with good  dilatation.  Injections following this showed residual stenosis but optimal  angioplasty at the origin of the previously placed stent of the left renal  artery.  Because of concerns over high risk of restenosis, it was elected to  perform with stenting.  This was done using exchange technique with a 6 mm x  12 mm Genesis stent on an 8 mm balloon to cover the ostia deployed at 12-30.  The guiding catheter showed excellent result with less than 10% to 0 left  renal artery ostial residual stenosis distal portion.  The dilatation system  was removed, side arm sheath was flushed, the patient was transferred to the  holding area for sheath removal and pressure hemostasis when the ACT  normalizes.    The patient has had successful redilatation of his symptomatic left  subclavian  artery with stent with PPA alone.  He has had cutting balloon  atherectomy redilatation and sandwich stenting of his left renal artery  ostia for in stent restenosis.  He has no significant left carotid disease  and ulcerative 50-60% proximal RICA stenosis.  I would recommend  antiplatelet therapy and continued surveillance of this.  Outpatient Doppler  surveillance is recommended with clinical follow up.  Arterial pressures  were monitored throughout the procedure ranging 160 to 170 mmHg coming down  to 130 to 140 by the end of the procedure.  The patient remained with atrial  pacing and intact AV conduction with a dual chamber pacer and excellent  position of his chronic atrial and ventricular leads on fluoroscopy.   CATHETERIZATION DIAGNOSIS:  1. ASPVD status post left subclavian and left renal artery stenting for     symptomatic disease, July 13, 1999.  2. Recurrent symptoms compatible with subclavian steal left upper extremity     claudication with ISR LSAEA treated with balloon angioplasty.  3. Left renal artery in stent restenosis with exacerbation of hypertension     treated with cutting balloon atherectomy, PTA, and subsequent sandwich     stenting for suboptimal angioplasty.  4. 50-60% ulcerative RICA stenosis, irregularities LICA, mild [id number] smooth     eccentric narrowing of the right segment of the internal carotid.  5. Occluded right  hypogastric asymptomatic.  6. Remote right coronary artery stenting x 2 in March 2001 with subsequent     negative Cardiolite March 2003.  7. Permanent pacemaker July 1985, EOL generator change June 28, 1993, Cosmos     II, on surveillance for 76 end of life characteristics.  8. Hyperlipidemia.  9. Renovascular hypertension.  10.      Remote smoker, tobacco chewer at present.                                              Richard A. Rollene Fare, M.D.    RAW/MEDQ  D:   01/22/2003  T:  01/22/2003  Job:  [id number]   cc:   Geoffery Lyons, M.D.   Christy Sartorius, M.D.

## 2010-08-09 HISTORY — PX: CARDIOVASCULAR STRESS TEST: SHX262

## 2011-01-29 ENCOUNTER — Other Ambulatory Visit: Payer: Self-pay | Admitting: Family Medicine

## 2011-01-29 ENCOUNTER — Ambulatory Visit
Admission: RE | Admit: 2011-01-29 | Discharge: 2011-01-29 | Disposition: A | Discharge status: Home / Self Care | Payer: Medicare Other | Source: Ambulatory Visit | Attending: Family Medicine | Admitting: Family Medicine

## 2011-01-29 DIAGNOSIS — R4781 Slurred speech: Secondary | ICD-10-CM

## 2011-01-29 MED ORDER — IOHEXOL 300 MG/ML  SOLN
75.0000 mL | Freq: Once | INTRAMUSCULAR | Status: AC | PRN
  Administered 2011-01-29: 75 mL via INTRAVENOUS

## 2011-08-15 ENCOUNTER — Other Ambulatory Visit: Payer: Self-pay

## 2011-08-15 DIAGNOSIS — I6529 Occlusion and stenosis of unspecified carotid artery: Secondary | ICD-10-CM

## 2011-08-17 ENCOUNTER — Encounter: Payer: Self-pay | Admitting: Vascular Surgery

## 2011-08-20 ENCOUNTER — Ambulatory Visit (INDEPENDENT_AMBULATORY_CARE_PROVIDER_SITE_OTHER): Payer: Medicare Other | Admitting: Vascular Surgery

## 2011-08-20 ENCOUNTER — Encounter: Payer: Self-pay | Admitting: Vascular Surgery

## 2011-08-20 VITALS — BP 155/69 | HR 67 | Resp 20 | Ht 65.0 in | Wt 120.0 lb

## 2011-08-20 DIAGNOSIS — I6529 Occlusion and stenosis of unspecified carotid artery: Secondary | ICD-10-CM

## 2011-08-20 DIAGNOSIS — Z48812 Encounter for surgical aftercare following surgery on the circulatory system: Secondary | ICD-10-CM

## 2011-08-20 NOTE — Progress Notes (Signed)
Subjective:     Patient ID: Thomas Snow, male   DOB: June 12, 1934, 77 y.o.   MRN: [id number]  HPI this 77 year old male was referred by Dr. Rollene Fare for severe carotid occlusive disease. Patient has been known to have moderate bilateral carotid occlusive disease. In January 2013 he had a 10 minute episode of aphasia which cleared spontaneously. Patient recently has been on Pradaxa 150 mg twice a day. He has had no recurrent symptoms. Followup carotid duplex exam 08/09/2011 and Dr. Lowella Fairy office revealed greater than 80% right ICA stenosis an approximate 50% left ICA stenosis. Patient was referred for possible surgical treatment.  Past Medical History  Diagnosis Date  . Hyperlipidemia   . Hypertension   . Carotid artery occlusion     History  Substance Use Topics  . Smoking status: Former Smoker -- 0.5 packs/day for 25 years    Types: Cigarettes    Quit date: 08/19/1996  . Smokeless tobacco: Current User  . Alcohol Use: No    No family history on file.  Allergies no known allergies  Current outpatient prescriptions:amLODipine (NORVASC) 2.5 MG tablet, Take 2.5 mg by mouth daily., Disp: , Rfl: ;  aspirin 81 MG tablet, Take 81 mg by mouth daily., Disp: , Rfl: ;  dabigatran (PRADAXA) 150 MG CAPS, Take 150 mg by mouth every 12 (twelve) hours., Disp: , Rfl: ;  ramipril (ALTACE) 10 MG capsule, Take 10 mg by mouth daily., Disp: , Rfl: ;  rosuvastatin (CRESTOR) 5 MG tablet, Take 5 mg by mouth daily., Disp: , Rfl:   BP 155/69  Pulse 67  Resp 20  Ht 5\' 5"  (1.651 m)  Wt 120 lb (54.432 kg)  BMI 19.97 kg/m2  Body mass index is 19.97 kg/(m^2).          Review of Systems denies active chest pain, dyspnea on exertion, PND, orthopnea. No claudication but does not ambulate long distances. Does have a pacemaker which is replaced about every 10 or 11 years. Had Cardiolite exam in late 2012 which revealed good ejection fraction and no evidence of ischemia.    Objective:   Physical  Exam blood pressure 100/69 heart rate 67 respirations 20 Gen.-alert and oriented x3 in no apparent distress HEENT normal for age Lungs no rhonchi or wheezing Cardiovascular regular rhythm no murmurs carotid pulses 3+ palpable bilateral harsh bruits noted over carotid bifurcations Abdomen soft nontender no palpable masses Musculoskeletal free of  major deformities Skin clear -no rashes Neurologic normal Lower extremities 3+ femoral and dorsalis pedis pulses palpable bilaterally with no edema  Today I ordered a carotid duplex exam in our office to compare to Dr. Rollene Fare study. There is agreement that there is an approximate 80-90% right internal carotid stenosis an approximate 50% left internal carotid stenosis.     Assessment:     Severe-greater than 80% right ICA stenosis and moderate left ICA stenosis with history of isolated TIA 8 months ago-no recurrence  History of stent in left subclavian artery History of isolated PTA and stent of coronary artery doing well by nuclear medicine scan and cleared for surgery by Dr. Rollene Fare    Plan:     Plan right carotid endarterectomy on Wednesday, September 4 Will discontinue Pradaxa 5 days prior to surgery Risks and benefits of surgery been thoroughly discussed with patient and he would like to proceed

## 2011-08-20 NOTE — Progress Notes (Signed)
Carotid duplex performed @ [facility] 08/20/2011

## 2011-08-21 ENCOUNTER — Encounter: Payer: Self-pay | Admitting: *Deleted

## 2011-08-21 ENCOUNTER — Other Ambulatory Visit: Payer: Self-pay | Admitting: *Deleted

## 2011-08-22 ENCOUNTER — Encounter (HOSPITAL_COMMUNITY): Payer: Self-pay | Admitting: Pharmacy Technician

## 2011-08-29 ENCOUNTER — Encounter (HOSPITAL_COMMUNITY): Payer: Self-pay

## 2011-08-29 ENCOUNTER — Encounter (HOSPITAL_COMMUNITY)
Admission: RE | Admit: 2011-08-29 | Discharge: 2011-08-29 | Disposition: A | Discharge status: Home / Self Care | Payer: Medicare Other | Source: Ambulatory Visit | Attending: Vascular Surgery | Admitting: Vascular Surgery

## 2011-08-29 HISTORY — DX: Unspecified osteoarthritis, unspecified site: M19.90

## 2011-08-29 HISTORY — DX: Pneumonia, unspecified organism: J18.9

## 2011-08-29 LAB — COMPREHENSIVE METABOLIC PANEL
ALT: 14 U/L (ref 0–53)
CO2: 30 mEq/L (ref 19–32)
Calcium: 10 mg/dL (ref 8.4–10.5)
Creatinine, Ser: 1.49 mg/dL — ABNORMAL HIGH (ref 0.50–1.35)
GFR calc Af Amer: 51 mL/min — ABNORMAL LOW (ref >90)
GFR calc non Af Amer: 44 mL/min — ABNORMAL LOW (ref >90)
Glucose, Bld: 112 mg/dL — ABNORMAL HIGH (ref 70–99)
Sodium: 141 mEq/L (ref 135–145)
Total Protein: 6.9 g/dL (ref 6.0–8.3)

## 2011-08-29 LAB — URINALYSIS, ROUTINE W REFLEX MICROSCOPIC
Bilirubin Urine: NEGATIVE
Hgb urine dipstick: NEGATIVE
Ketones, ur: NEGATIVE mg/dL
Nitrite: NEGATIVE
Specific Gravity, Urine: 1.025 (ref 1.005–1.030)
Urobilinogen, UA: 0.2 mg/dL (ref 0.0–1.0)

## 2011-08-29 LAB — SURGICAL PCR SCREEN
MRSA, PCR: NEGATIVE
Staphylococcus aureus: NEGATIVE

## 2011-08-29 LAB — CBC
Hemoglobin: 15.4 g/dL (ref 13.0–17.0)
MCH: 31.2 pg (ref 26.0–34.0)
MCHC: 34.9 g/dL (ref 30.0–36.0)
MCV: 89.3 fL (ref 78.0–100.0)
RBC: 4.94 MIL/uL (ref 4.22–5.81)

## 2011-08-29 LAB — PROTIME-INR
INR: 1.05 (ref 0.00–1.49)
Prothrombin Time: 13.9 seconds (ref 11.6–15.2)

## 2011-08-29 NOTE — Progress Notes (Signed)
Patient informed Nurse that he had a stress test, and cardiac cath with Dr. Rollene Fare at [facility]. Records requested. Perioperative prescription for implanted cardiac device programming sheet also faxed to [facility]. Patient has pacer but is unaware of what type. No card in wallet. Patient denied having a sleep study.

## 2011-08-29 NOTE — Pre-Procedure Instructions (Signed)
20 COLWYN BAGGARLY  08/29/2011   Your procedure is scheduled on:  Wednesday September 05, 2011.  Report to [facility] at [place] AM.  Call this number if you have problems the morning of surgery: [phone]   Remember:   Do not eat food or drink:After Midnight.    Take these medicines the morning of surgery with A SIP OF WATER: Amlodipine (Norvasc)   Do not wear jewelry  Do not wear lotions or colognes.  Men may shave face and neck.  Do not bring valuables to the hospital.  Contacts, dentures or bridgework may not be worn into surgery.  Leave suitcase in the car. After surgery it may be brought to your room.  For patients admitted to the hospital, checkout time is 11:00 AM the day of discharge.   Patients discharged the day of surgery will not be allowed to drive home.  Name and phone number of your driver:   Special Instructions: CHG Shower Use Special Wash: 1/2 bottle night before surgery and 1/2 bottle morning of surgery.   Please read over the following fact sheets that you were given: Pain Booklet, Coughing and Deep Breathing, Blood Transfusion Information, MRSA Information and Surgical Site Infection Prevention

## 2011-08-30 ENCOUNTER — Encounter (HOSPITAL_COMMUNITY): Payer: Self-pay | Admitting: Vascular Surgery

## 2011-08-30 NOTE — Consult Note (Signed)
Anesthesia Chart Review:  Patient is a 77 year old male scheduled for right carotid endarterectomy by Dr. Kellie Simmering on 09/05/2011. History includes former smoker, hypertension, hyperlipidemia, carotid occlusive disease (08/09/11 [id number] RICAS, [id number] LICAS), left renal artery stent in 2005, left subclavian stent in 2001, coronary artery disease with proximal and mid RCA Cutting Balloon atherectomy 2001, PPM (Cosmos '85 ->Cosmos II '95->St. Jude Verity '06).  His Cardiologist is Dr. Rollene Fare who referred patient to [facility].   Records from [facility]: EKG on 08/14/2011 showed normal sinus rhythm. Echo on 03/21/2011. Showed normal LV size and systolic function, estimated EF [id number], normal diastolic function for age, mildly dilated LA, mild tricuspid insufficiency, aortic valve sclerosis without stenosis, pacemaker leads noted, no pericardial effusion. Stress test on 08/09/2010 showed normal myocardial perfusion imaging, no significant ischemia, post-stress EF 78%, low risk scan.  No acute abnormalities on chest x-ray from 08/29/2011.  Labs noted.  Cr 1.49 (stable).  Short stay nursing staff will followup on patient's perioperative prescription for his permanent pacemaker.  Myra Gianotti, PA-C

## 2011-08-31 NOTE — Progress Notes (Signed)
Refaxed "perioperative prescription for implanted cardiac device programming" order form to Dr Tilden Fossa clinic

## 2011-09-04 MED ORDER — DEXTROSE 5 % IV SOLN
1.5000 g | INTRAVENOUS | Status: AC
  Administered 2011-09-05: 1.5 g via INTRAVENOUS
  Filled 2011-09-04: qty 1.5

## 2011-09-05 ENCOUNTER — Encounter (HOSPITAL_COMMUNITY): Payer: Self-pay | Admitting: Anesthesiology

## 2011-09-05 ENCOUNTER — Encounter (HOSPITAL_COMMUNITY): Payer: Self-pay | Admitting: Vascular Surgery

## 2011-09-05 ENCOUNTER — Ambulatory Visit (HOSPITAL_COMMUNITY): Payer: Medicare Other | Admitting: Vascular Surgery

## 2011-09-05 ENCOUNTER — Inpatient Hospital Stay (HOSPITAL_COMMUNITY)
Admission: RE | Admit: 2011-09-05 | Discharge: 2011-09-06 | DRG: 039 | Disposition: A | Discharge status: Home / Self Care | Payer: Medicare Other | Source: Ambulatory Visit | Attending: Vascular Surgery | Admitting: Vascular Surgery

## 2011-09-05 ENCOUNTER — Encounter (HOSPITAL_COMMUNITY)
Admission: RE | Disposition: A | Discharge status: Home / Self Care | Payer: Self-pay | Source: Ambulatory Visit | Attending: Vascular Surgery

## 2011-09-05 ENCOUNTER — Encounter (HOSPITAL_COMMUNITY): Payer: Self-pay | Admitting: *Deleted

## 2011-09-05 DIAGNOSIS — N289 Disorder of kidney and ureter, unspecified: Secondary | ICD-10-CM | POA: Diagnosis present

## 2011-09-05 DIAGNOSIS — Z8673 Personal history of transient ischemic attack (TIA), and cerebral infarction without residual deficits: Secondary | ICD-10-CM

## 2011-09-05 DIAGNOSIS — Z950 Presence of cardiac pacemaker: Secondary | ICD-10-CM

## 2011-09-05 DIAGNOSIS — M1990 Unspecified osteoarthritis, unspecified site: Secondary | ICD-10-CM | POA: Diagnosis present

## 2011-09-05 DIAGNOSIS — Z87891 Personal history of nicotine dependence: Secondary | ICD-10-CM

## 2011-09-05 DIAGNOSIS — I6529 Occlusion and stenosis of unspecified carotid artery: Principal | ICD-10-CM | POA: Diagnosis present

## 2011-09-05 DIAGNOSIS — I10 Essential (primary) hypertension: Secondary | ICD-10-CM | POA: Diagnosis present

## 2011-09-05 HISTORY — PX: ENDARTERECTOMY: SHX5162

## 2011-09-05 HISTORY — PX: CAROTID ENDARTERECTOMY: SUR193

## 2011-09-05 SURGERY — ENDARTERECTOMY, CAROTID
Anesthesia: General | Site: Neck | Laterality: Right | Wound class: Clean

## 2011-09-05 MED ORDER — SODIUM CHLORIDE 0.9 % IR SOLN
Status: DC | PRN
  Administered 2011-09-05: 09:00:00

## 2011-09-05 MED ORDER — DABIGATRAN ETEXILATE MESYLATE 150 MG PO CAPS: 150.0000 mg | ORAL_CAPSULE | Freq: Two times a day (BID) | ORAL | Status: DC

## 2011-09-05 MED ORDER — LABETALOL HCL 5 MG/ML IV SOLN
INTRAVENOUS | Status: DC | PRN
  Administered 2011-09-05 (×2): 5 mg via INTRAVENOUS
  Administered 2011-09-05: 10 mg via INTRAVENOUS

## 2011-09-05 MED ORDER — PROPOFOL 10 MG/ML IV EMUL: INTRAVENOUS | Status: DC | PRN

## 2011-09-05 MED ORDER — ATORVASTATIN CALCIUM 10 MG PO TABS
10.0000 mg | ORAL_TABLET | Freq: Every day | ORAL | Status: DC
  Filled 2011-09-05 (×2): qty 1

## 2011-09-05 MED ORDER — RAMIPRIL 10 MG PO CAPS
10.0000 mg | ORAL_CAPSULE | Freq: Every day | ORAL | Status: DC
  Filled 2011-09-05: qty 1

## 2011-09-05 MED ORDER — SODIUM CHLORIDE 0.9 % IR SOLN
Status: DC | PRN
  Administered 2011-09-05 (×2): 1000 mL

## 2011-09-05 MED ORDER — DOPAMINE-DEXTROSE 3.2-5 MG/ML-% IV SOLN: 3.0000 ug/kg/min | INTRAVENOUS | Status: DC | PRN

## 2011-09-05 MED ORDER — ONDANSETRON HCL 4 MG/2ML IJ SOLN
INTRAMUSCULAR | Status: DC | PRN
  Administered 2011-09-05: 4 mg via INTRAVENOUS

## 2011-09-05 MED ORDER — GUAIFENESIN-DM 100-10 MG/5ML PO SYRP: 15.0000 mL | ORAL_SOLUTION | ORAL | Status: DC | PRN

## 2011-09-05 MED ORDER — SODIUM CHLORIDE 0.9 % IV SOLN
INTRAVENOUS | Status: DC
  Administered 2011-09-05: 100 mL via INTRAVENOUS

## 2011-09-05 MED ORDER — PHENOL 1.4 % MT LIQD
1.0000 | OROMUCOSAL | Status: DC | PRN
  Administered 2011-09-05: 1 via OROMUCOSAL
  Filled 2011-09-05: qty 177

## 2011-09-05 MED ORDER — LACTATED RINGERS IV SOLN
INTRAVENOUS | Status: DC | PRN
  Administered 2011-09-05: 08:00:00 via INTRAVENOUS

## 2011-09-05 MED ORDER — HYDROMORPHONE HCL PF 1 MG/ML IJ SOLN: 0.2500 mg | INTRAMUSCULAR | Status: DC | PRN

## 2011-09-05 MED ORDER — LABETALOL HCL 5 MG/ML IV SOLN
INTRAVENOUS | Status: AC
  Administered 2011-09-05: 10 mg
  Filled 2011-09-05: qty 4

## 2011-09-05 MED ORDER — LABETALOL HCL 5 MG/ML IV SOLN: 10.0000 mg | INTRAVENOUS | Status: DC | PRN

## 2011-09-05 MED ORDER — LIDOCAINE HCL (CARDIAC) 20 MG/ML IV SOLN
INTRAVENOUS | Status: DC | PRN
  Administered 2011-09-05: 80 mg via INTRAVENOUS

## 2011-09-05 MED ORDER — LIDOCAINE HCL (PF) 1 % IJ SOLN
INTRAMUSCULAR | Status: AC
  Filled 2011-09-05: qty 30

## 2011-09-05 MED ORDER — DEXTROSE 5 % IV SOLN
1.5000 g | Freq: Two times a day (BID) | INTRAVENOUS | Status: AC
  Administered 2011-09-05 – 2011-09-06 (×2): 1.5 g via INTRAVENOUS
  Filled 2011-09-05 (×2): qty 1.5

## 2011-09-05 MED ORDER — ACETAMINOPHEN 650 MG RE SUPP: 325.0000 mg | RECTAL | Status: DC | PRN

## 2011-09-05 MED ORDER — HYDRALAZINE HCL 20 MG/ML IJ SOLN: 10.0000 mg | INTRAMUSCULAR | Status: DC | PRN

## 2011-09-05 MED ORDER — SODIUM CHLORIDE 0.9 % IV SOLN: 500.0000 mL | Freq: Once | INTRAVENOUS | Status: AC | PRN

## 2011-09-05 MED ORDER — SODIUM CHLORIDE 0.9 % IV SOLN: INTRAVENOUS | Status: DC

## 2011-09-05 MED ORDER — HEPARIN SODIUM (PORCINE) 1000 UNIT/ML IJ SOLN
INTRAMUSCULAR | Status: DC | PRN
  Administered 2011-09-05: 6000 [IU] via INTRAVENOUS

## 2011-09-05 MED ORDER — ALUM & MAG HYDROXIDE-SIMETH 200-200-20 MG/5ML PO SUSP: 15.0000 mL | ORAL | Status: DC | PRN

## 2011-09-05 MED ORDER — DABIGATRAN ETEXILATE MESYLATE 75 MG PO CAPS
75.0000 mg | ORAL_CAPSULE | Freq: Two times a day (BID) | ORAL | Status: DC
  Filled 2011-09-05 (×2): qty 1

## 2011-09-05 MED ORDER — ROCURONIUM BROMIDE 100 MG/10ML IV SOLN
INTRAVENOUS | Status: DC | PRN
  Administered 2011-09-05: 50 mg via INTRAVENOUS

## 2011-09-05 MED ORDER — POTASSIUM CHLORIDE CRYS ER 20 MEQ PO TBCR: 20.0000 meq | EXTENDED_RELEASE_TABLET | Freq: Once | ORAL | Status: AC | PRN

## 2011-09-05 MED ORDER — PROPOFOL 10 MG/ML IV BOLUS
INTRAVENOUS | Status: DC | PRN
  Administered 2011-09-05: 150 mg via INTRAVENOUS

## 2011-09-05 MED ORDER — DOCUSATE SODIUM 100 MG PO CAPS: 100.0000 mg | ORAL_CAPSULE | Freq: Every day | ORAL | Status: DC

## 2011-09-05 MED ORDER — MORPHINE SULFATE 2 MG/ML IJ SOLN
2.0000 mg | INTRAMUSCULAR | Status: DC | PRN
  Administered 2011-09-05 (×2): 2 mg via INTRAVENOUS
  Filled 2011-09-05 (×2): qty 1

## 2011-09-05 MED ORDER — PROTAMINE SULFATE 10 MG/ML IV SOLN
INTRAVENOUS | Status: DC | PRN
  Administered 2011-09-05: 50 mg via INTRAVENOUS

## 2011-09-05 MED ORDER — AMLODIPINE BESYLATE 2.5 MG PO TABS
2.5000 mg | ORAL_TABLET | Freq: Every day | ORAL | Status: DC
  Filled 2011-09-05: qty 1

## 2011-09-05 MED ORDER — ACETAMINOPHEN 325 MG PO TABS: 325.0000 mg | ORAL_TABLET | ORAL | Status: DC | PRN

## 2011-09-05 MED ORDER — NEOSTIGMINE METHYLSULFATE 1 MG/ML IJ SOLN
INTRAMUSCULAR | Status: DC | PRN
  Administered 2011-09-05: 4 mg via INTRAVENOUS

## 2011-09-05 MED ORDER — OXYCODONE-ACETAMINOPHEN 5-325 MG PO TABS: 1.0000 | ORAL_TABLET | ORAL | Status: AC | PRN

## 2011-09-05 MED ORDER — FENTANYL CITRATE 0.05 MG/ML IJ SOLN
INTRAMUSCULAR | Status: DC | PRN
  Administered 2011-09-05 (×2): 50 ug via INTRAVENOUS

## 2011-09-05 MED ORDER — OXYCODONE-ACETAMINOPHEN 5-325 MG PO TABS
1.0000 | ORAL_TABLET | ORAL | Status: DC | PRN
  Administered 2011-09-05 – 2011-09-06 (×2): 1 via ORAL
  Filled 2011-09-05 (×2): qty 1

## 2011-09-05 MED ORDER — GLYCOPYRROLATE 0.2 MG/ML IJ SOLN
INTRAMUSCULAR | Status: DC | PRN
  Administered 2011-09-05: 0.6 mg via INTRAVENOUS

## 2011-09-05 MED ORDER — DABIGATRAN ETEXILATE MESYLATE 75 MG PO CAPS: 75.0000 mg | ORAL_CAPSULE | Freq: Two times a day (BID) | ORAL | Status: DC

## 2011-09-05 MED ORDER — ONDANSETRON HCL 4 MG/2ML IJ SOLN: 4.0000 mg | Freq: Four times a day (QID) | INTRAMUSCULAR | Status: DC | PRN

## 2011-09-05 MED ORDER — ASPIRIN EC 81 MG PO TBEC
81.0000 mg | DELAYED_RELEASE_TABLET | Freq: Every day | ORAL | Status: DC
  Filled 2011-09-05: qty 1

## 2011-09-05 MED ORDER — METOPROLOL TARTRATE 1 MG/ML IV SOLN: 2.0000 mg | INTRAVENOUS | Status: DC | PRN

## 2011-09-05 MED ORDER — PANTOPRAZOLE SODIUM 40 MG PO TBEC
40.0000 mg | DELAYED_RELEASE_TABLET | Freq: Every day | ORAL | Status: DC
  Administered 2011-09-05: 40 mg via ORAL
  Filled 2011-09-05: qty 1

## 2011-09-05 MED ORDER — ASPIRIN 81 MG PO TABS: 81.0000 mg | ORAL_TABLET | Freq: Every day | ORAL | Status: DC

## 2011-09-05 SURGICAL SUPPLY — 44 items
CANISTER SUCTION 2500CC (MISCELLANEOUS) ×2 IMPLANT
CATH ROBINSON RED A/P 18FR (CATHETERS) ×2 IMPLANT
CATH SUCT 10FR WHISTLE TIP (CATHETERS) ×2 IMPLANT
CLIP TI MEDIUM 24 (CLIP) ×2 IMPLANT
CLIP TI WIDE RED SMALL 24 (CLIP) ×2 IMPLANT
CLOTH BEACON ORANGE TIMEOUT ST (SAFETY) ×2 IMPLANT
COVER SURGICAL LIGHT HANDLE (MISCELLANEOUS) ×2 IMPLANT
CRADLE DONUT ADULT HEAD (MISCELLANEOUS) ×2 IMPLANT
DECANTER SPIKE VIAL GLASS SM (MISCELLANEOUS) IMPLANT
DRAIN HEMOVAC 1/8 X 5 (WOUND CARE) IMPLANT
DRAPE WARM FLUID 44X44 (DRAPE) ×2 IMPLANT
ELECT REM PT RETURN 9FT ADLT (ELECTROSURGICAL) ×2
ELECTRODE REM PT RTRN 9FT ADLT (ELECTROSURGICAL) ×1 IMPLANT
EVACUATOR SILICONE 100CC (DRAIN) IMPLANT
GLOVE BIO SURGEON STRL SZ 6.5 (GLOVE) ×1 IMPLANT
GLOVE BIOGEL PI IND STRL 6.5 (GLOVE) IMPLANT
GLOVE BIOGEL PI IND STRL 7.0 (GLOVE) IMPLANT
GLOVE BIOGEL PI INDICATOR 6.5 (GLOVE) ×1
GLOVE BIOGEL PI INDICATOR 7.0 (GLOVE) ×2
GLOVE ECLIPSE 6.5 STRL STRAW (GLOVE) ×1 IMPLANT
GLOVE SS BIOGEL STRL SZ 7 (GLOVE) ×1 IMPLANT
GLOVE SUPERSENSE BIOGEL SZ 7 (GLOVE) ×1
GLOVE SURG SS PI 6.5 STRL IVOR (GLOVE) ×1 IMPLANT
GOWN PREVENTION PLUS XXLARGE (GOWN DISPOSABLE) ×1 IMPLANT
GOWN STRL NON-REIN LRG LVL3 (GOWN DISPOSABLE) ×5 IMPLANT
INSERT FOGARTY SM (MISCELLANEOUS) ×2 IMPLANT
KIT BASIN OR (CUSTOM PROCEDURE TRAY) ×2 IMPLANT
KIT ROOM TURNOVER OR (KITS) ×2 IMPLANT
NEEDLE 22X1 1/2 (OR ONLY) (NEEDLE) IMPLANT
NS IRRIG 1000ML POUR BTL (IV SOLUTION) ×4 IMPLANT
PACK CAROTID (CUSTOM PROCEDURE TRAY) ×2 IMPLANT
PAD ARMBOARD 7.5X6 YLW CONV (MISCELLANEOUS) ×4 IMPLANT
PATCH HEMASHIELD 8X75 (Vascular Products) ×1 IMPLANT
SHUNT CAROTID BYPASS 12FRX15.5 (VASCULAR PRODUCTS) ×1 IMPLANT
SPECIMEN JAR SMALL (MISCELLANEOUS) ×2 IMPLANT
SUT PROLENE 6 0 CC (SUTURE) ×2 IMPLANT
SUT SILK 2 0 FS (SUTURE) ×2 IMPLANT
SUT VIC AB 2-0 CT1 27 (SUTURE) ×2
SUT VIC AB 2-0 CT1 TAPERPNT 27 (SUTURE) ×1 IMPLANT
SUT VIC AB 3-0 X1 27 (SUTURE) ×2 IMPLANT
SYR CONTROL 10ML LL (SYRINGE) IMPLANT
TOWEL OR 17X24 6PK STRL BLUE (TOWEL DISPOSABLE) ×2 IMPLANT
TOWEL OR 17X26 10 PK STRL BLUE (TOWEL DISPOSABLE) ×2 IMPLANT
WATER STERILE IRR 1000ML POUR (IV SOLUTION) ×2 IMPLANT

## 2011-09-05 NOTE — Progress Notes (Signed)
VASCULAR AND VEIN SURGERY POST - OP CEA PROGRESS NOTE  Date of Surgery: 09/05/2011 Surgeon: Surgeon(s): Mal Misty, MD Day of Surgery right Carotid Endarterectomy .  HPI: Thomas Snow is a 77 y.o. male who is Day of Surgery right Carotid Endarterectomy . Patient is doing well.  Patient denies headache; Patient denies difficulty swallowing; denies weakness in upper or lower extremities; Pt. denies other symptoms of stroke or TIA.  IMAGING: No results found.  Significant Diagnostic Studies: CBC Lab Results  Component Value Date   WBC 9.9 08/29/2011   HGB 15.4 08/29/2011   HCT 44.1 08/29/2011   MCV 89.3 08/29/2011   PLT 242 08/29/2011    BMET    Component Value Date/Time   NA 141 08/29/2011 1149   K 4.3 08/29/2011 1149   CL 102 08/29/2011 1149   CO2 30 08/29/2011 1149   GLUCOSE 112* 08/29/2011 1149   BUN 23 08/29/2011 1149   CREATININE 1.49* 08/29/2011 1149   CALCIUM 10.0 08/29/2011 1149   GFRNONAA 44* 08/29/2011 1149   GFRAA 51* 08/29/2011 1149    COAG Lab Results  Component Value Date   INR 1.05 08/29/2011   No results found for this basename: PTT      Intake/Output Summary (Last 24 hours) at 09/05/11 1712 Last data filed at 09/05/11 1657  Gross per 24 hour  Intake   1000 ml  Output      0 ml  Net   1000 ml    Physical Exam:  BP Readings from Last 3 Encounters:  09/05/11 144/54  09/05/11 144/54  08/29/11 148/75   Temp Readings from Last 3 Encounters:  09/05/11 98.2 F (36.8 C)   09/05/11 98.2 F (36.8 C)   08/29/11 98 F (36.7 C)    SpO2 Readings from Last 3 Encounters:  09/05/11 97%  09/05/11 97%  08/29/11 98%   Pulse Readings from Last 3 Encounters:  09/05/11 71  09/05/11 71  08/29/11 72    Pt is A&O x 3  Speech is fluent right Neck Wound is clean, dry, intact or healing well Patient with Negative tongue deviation and Negative facial droop Pt has good and equal strength in all extremities  Assessment: Thomas Snow is a 77 y.o.  male is S/P Right Carotid endarterectomy Pt is voiding, ambulating and taking po well   Plan: Discharge to: Home in am if voids, ambulates and takes po well Follow-up in 2 weeks   [place] [phone] 09/05/2011 5:12 PM

## 2011-09-05 NOTE — Transfer of Care (Signed)
Immediate Anesthesia Transfer of Care Note  Patient: Thomas Snow  Procedure(s) Performed: Procedure(s) (LRB) with comments: ENDARTERECTOMY CAROTID (Right)  Patient Location: PACU  Anesthesia Type: General  Level of Consciousness: awake, alert , oriented and patient cooperative  Airway & Oxygen Therapy: Patient Spontanous Breathing, Patient connected to face mask oxygen and Patient remains intubated per anesthesia plan  Post-op Assessment: Report given to PACU RN, Post -op Vital signs reviewed and stable, Patient moving all extremities X 4 and Patient able to stick tongue midline  Post vital signs: Reviewed and stable  Complications: No apparent anesthesia complications

## 2011-09-05 NOTE — Interval H&P Note (Signed)
History and Physical Interval Note:  09/05/2011 8:17 AM  Thomas Snow  has presented today for surgery, with the diagnosis of RIGHT ICA STENOSIS  The various methods of treatment have been discussed with the patient and family. After consideration of risks, benefits and other options for treatment, the patient has consented to  Procedure(s) (LRB) with comments: ENDARTERECTOMY CAROTID (Right) as a surgical intervention .  The patient's history has been reviewed, patient examined, no change in status, stable for surgery.  I have reviewed the patient's chart and labs.  Questions were answered to the patient's satisfaction.     Tinnie Gens

## 2011-09-05 NOTE — Progress Notes (Signed)
Utilization review completed.  

## 2011-09-05 NOTE — Anesthesia Preprocedure Evaluation (Addendum)
Anesthesia Evaluation  Patient identified by MRN, date of birth, ID band Patient awake    Reviewed: Allergy & Precautions, H&P , NPO status , Patient's Chart, lab work & pertinent test results  Airway Mallampati: II  Neck ROM: full    Dental  (+) Edentulous Upper and Lower Dentures   Pulmonary former smoker,          Cardiovascular hypertension, Pt. on medications + CAD + pacemaker     Neuro/Psych    GI/Hepatic   Endo/Other    Renal/GU Renal InsufficiencyRenal disease     Musculoskeletal  (+) Arthritis -, Osteoarthritis,    Abdominal   Peds  Hematology   Anesthesia Other Findings   Reproductive/Obstetrics                         Anesthesia Physical Anesthesia Plan  ASA: III  Anesthesia Plan: General   Post-op Pain Management:    Induction: Intravenous  Airway Management Planned: Oral ETT  Additional Equipment: Arterial line  Intra-op Plan:   Post-operative Plan: Extubation in OR  Informed Consent: I have reviewed the patients History and Physical, chart, labs and discussed the procedure including the risks, benefits and alternatives for the proposed anesthesia with the patient or authorized representative who has indicated his/her understanding and acceptance.     Plan Discussed with: CRNA and Surgeon  Anesthesia Plan Comments:         Anesthesia Quick Evaluation

## 2011-09-05 NOTE — Anesthesia Postprocedure Evaluation (Signed)
Anesthesia Post Note  Patient: Thomas Snow  Procedure(s) Performed: Procedure(s) (LRB): ENDARTERECTOMY CAROTID (Right)  Anesthesia type: General  Patient location: PACU  Post pain: Pain level controlled and Adequate analgesia  Post assessment: Post-op Vital signs reviewed, Patient's Cardiovascular Status Stable, Respiratory Function Stable, Patent Airway and Pain level controlled  Last Vitals:  Filed Vitals:   09/05/11 1136  BP:   Pulse: 69  Temp:   Resp: 12    Post vital signs: Reviewed and stable  Level of consciousness: awake, alert  and oriented  Complications: No apparent anesthesia complications

## 2011-09-05 NOTE — Progress Notes (Signed)
PT HAS NASAL CONGESTION AND COUGH THIS AM ..

## 2011-09-05 NOTE — H&P (View-Only) (Signed)
Subjective:     Patient ID: Thomas Snow, male   DOB: 07/16/1934, 77 y.o.   MRN: [id number]  HPI this 77 year old male was referred by Dr. Rollene Fare for severe carotid occlusive disease. Patient has been known to have moderate bilateral carotid occlusive disease. In January 2013 he had a 10 minute episode of aphasia which cleared spontaneously. Patient recently has been on Pradaxa 150 mg twice a day. He has had no recurrent symptoms. Followup carotid duplex exam 08/09/2011 and Dr. Lowella Fairy office revealed greater than 80% right ICA stenosis an approximate 50% left ICA stenosis. Patient was referred for possible surgical treatment.  Past Medical History  Diagnosis Date  . Hyperlipidemia   . Hypertension   . Carotid artery occlusion     History  Substance Use Topics  . Smoking status: Former Smoker -- 0.5 packs/day for 25 years    Types: Cigarettes    Quit date: 08/19/1996  . Smokeless tobacco: Current User  . Alcohol Use: No    No family history on file.  Allergies no known allergies  Current outpatient prescriptions:amLODipine (NORVASC) 2.5 MG tablet, Take 2.5 mg by mouth daily., Disp: , Rfl: ;  aspirin 81 MG tablet, Take 81 mg by mouth daily., Disp: , Rfl: ;  dabigatran (PRADAXA) 150 MG CAPS, Take 150 mg by mouth every 12 (twelve) hours., Disp: , Rfl: ;  ramipril (ALTACE) 10 MG capsule, Take 10 mg by mouth daily., Disp: , Rfl: ;  rosuvastatin (CRESTOR) 5 MG tablet, Take 5 mg by mouth daily., Disp: , Rfl:   BP 155/69  Pulse 67  Resp 20  Ht 5\' 5"  (1.651 m)  Wt 120 lb (54.432 kg)  BMI 19.97 kg/m2  Body mass index is 19.97 kg/(m^2).          Review of Systems denies active chest pain, dyspnea on exertion, PND, orthopnea. No claudication but does not ambulate long distances. Does have a pacemaker which is replaced about every 10 or 11 years. Had Cardiolite exam in late 2012 which revealed good ejection fraction and no evidence of ischemia.    Objective:   Physical  Exam blood pressure 100/69 heart rate 67 respirations 20 Gen.-alert and oriented x3 in no apparent distress HEENT normal for age Lungs no rhonchi or wheezing Cardiovascular regular rhythm no murmurs carotid pulses 3+ palpable bilateral harsh bruits noted over carotid bifurcations Abdomen soft nontender no palpable masses Musculoskeletal free of  major deformities Skin clear -no rashes Neurologic normal Lower extremities 3+ femoral and dorsalis pedis pulses palpable bilaterally with no edema  Today I ordered a carotid duplex exam in our office to compare to Dr. Rollene Fare study. There is agreement that there is an approximate 80-90% right internal carotid stenosis an approximate 50% left internal carotid stenosis.     Assessment:     Severe-greater than 80% right ICA stenosis and moderate left ICA stenosis with history of isolated TIA 8 months ago-no recurrence  History of stent in left subclavian artery History of isolated PTA and stent of coronary artery doing well by nuclear medicine scan and cleared for surgery by Dr. Rollene Fare    Plan:     Plan right carotid endarterectomy on Wednesday, September 4 Will discontinue Pradaxa 5 days prior to surgery Risks and benefits of surgery been thoroughly discussed with patient and he would like to proceed

## 2011-09-05 NOTE — Op Note (Signed)
OPERATIVE REPORT  Date of Surgery: 09/05/2011  Surgeon: Tinnie Gens, MD  Assistant: Johnette Abraham  Pre-op Diagnosis: RIGHT ICA STENOSIS with history of right brain CVA Post-op Diagnosis: Same  Procedure: Procedure(s): Right carotid endarterectomy with Dacron patch angioplasty Anesthesia: General  EBL: Minimal  Complications: None  Procedure Details: The patient was taken to the operating room and placed in the supine position. Following induction of satisfactory general endotracheal anesthesia the right neck was prepped and draped in a routine sterile manner. Incision was made on the anterior border of the sternocleidomastoid muscle and carried down through the subcutaneous tissue and platysma using the Bovie. Care was taken not to injure the hypoglossal nerve.. The common internal and external carotid arteries were dissected free. There was a calcified atherosclerotic plaque at the carotid bifurcation extending up the internal carotid artery. A #10 shunt was then prepared and the patient was heparinized. The carotid vessels were occluded with vascular clamps. A longitudinal opening was made in the common carotid with a 15 blade extended up the internal carotid with the Potts scissors to a point distal to the disease. The plaque was approximately 90 % stenotic in severity. The distal vessel appeared normal. Shunt was inserted without difficulty reestablishing flow in about 2 minutes. A standard endarterectomy was performed with an eversion endarterectomy of the external carotid. The plaque feathered off  the distal internal carotid artery nicely not requiring any tacking sutures. The lumen was thoroughly irrigated with heparinized saline and loose debris all carefully removed. The arterotomy was then closed with a patch using continuous 6-0 Prolene. Prior to completion of the  Closure the  shunt was removed after approximately 30 minutes of shunt time. Flow was then reestablished up the  external branch initially followed by the internal branch. Protamine was given to her reverse the heparin.Following adequate hemostasis the wound was irrigated with saline and closed in layers with Vicryl ain a subcuticular fashion. Sterile dressing was applied and the patient taken to the recovery room in stable condition.  Tinnie Gens, MD 09/05/2011 10:36 AM

## 2011-09-05 NOTE — Discharge Summary (Signed)
[facility] Discharge Summary   Patient ID:  Thomas Snow MRN: [id number] DOB/AGE: 1934-01-20 77 y.o.  Admit date: 09/05/2011 Discharge date: 09/06/11 Date of Surgery: 09/05/2011 Surgeon: Surgeon(s): Mal Misty, MD  Admission Diagnosis: RIGHT ICA STENOSIS  Discharge Diagnoses:  RIGHT ICA STENOSIS  Secondary Diagnoses: Past Medical History  Diagnosis Date  . Hyperlipidemia   . Hypertension   . Pneumonia     as a child  . Pacemaker   . Arthritis   . Coronary artery disease     prox and mid RCA atherectomy '01  . Carotid artery occlusion     left SCA stent '01, carotid occlusive dz    Procedure(s): ENDARTERECTOMY CAROTID  Discharged Condition: good  HPI:  77 year old male was referred by Dr. Rollene Fare for severe carotid occlusive disease. Patient has been known to have moderate bilateral carotid occlusive disease. In January 2013 he had a 10 minute episode of aphasia which cleared spontaneously. Patient recently has been on Pradaxa 150 mg twice a day. He has had no recurrent symptoms. Followup carotid duplex exam 08/09/2011 and Dr. Lowella Fairy office revealed greater than 80% right ICA stenosis an approximate 50% left ICA stenosis. Patient was referred for possible surgical treatment.   Hospital Course:  Thomas Snow is a 77 y.o. male is S/P Right Procedure(s): ENDARTERECTOMY CAROTID Extubated: POD # 0 Post-op wounds healing well Pt. Ambulating, voiding and taking PO diet without difficulty. Pt pain controlled with PO pain meds. Neuro exam is intact Labs as below Complications:none  Consults:     Significant Diagnostic Studies: CBC Lab Results  Component Value Date   WBC 9.9 08/29/2011   HGB 15.4 08/29/2011   HCT 44.1 08/29/2011   MCV 89.3 08/29/2011   PLT 242 08/29/2011    BMET    Component Value Date/Time   NA 141 08/29/2011 1149   K 4.3 08/29/2011 1149   CL 102 08/29/2011 1149   CO2 30 08/29/2011 1149   GLUCOSE 112* 08/29/2011  1149   BUN 23 08/29/2011 1149   CREATININE 1.49* 08/29/2011 1149   CALCIUM 10.0 08/29/2011 1149   GFRNONAA 44* 08/29/2011 1149   GFRAA 51* 08/29/2011 1149   COAG Lab Results  Component Value Date   INR 1.05 08/29/2011     Disposition:  Discharge to :Home Discharge Orders    Future Appointments: Provider: Department: Dept Phone: Center:   09/18/2011 9:45 AM Mal Misty, MD [facility] [phone] [facility]     Future Orders Please Complete By Expires   Resume previous diet      Driving Restrictions      Comments:   No driving for 2 weeks   Lifting restrictions      Comments:   No lifting for 4 weeks   Call MD for:  temperature >100.5      Call MD for:  redness, tenderness, or signs of infection (pain, swelling, bleeding, redness, odor or green/yellow discharge around incision site)      Call MD for:  severe or increased pain, loss or decreased feeling  in affected limb(s)      Increase activity slowly      Comments:   Walk with assistance use walker or cane as needed   May shower       Scheduling Instructions:   friday   No dressing needed      may wash over wound with mild soap and water      CAROTID Sugery: Call MD for difficulty swallowing  or speaking; weakness in arms or legs that is a new symtom; severe headache.  If you have increased swelling in the neck and/or  are having difficulty breathing, CALL 911         Thomas Snow, Thomas Snow  Home Medication Instructions [id number]   Printed on:09/05/11 1424  Medication Information                    amLODipine (NORVASC) 2.5 MG tablet Take 2.5 mg by mouth daily.           ramipril (ALTACE) 10 MG capsule Take 10 mg by mouth daily.           rosuvastatin (CRESTOR) 5 MG tablet Take 5 mg by mouth daily.           dabigatran (PRADAXA) 75 MG CAPS Take 75 mg by mouth every 12 (twelve) hours.           aspirin 81 MG tablet Take 81 mg by mouth daily.           oxyCODONE-acetaminophen (ROXICET) 5-325 MG per tablet Take 1  tablet by mouth every 4 (four) hours as needed for pain.            Verbal and written Discharge instructions given to the patient. Wound care per Discharge AVS Follow-up Information    Follow up with Tinnie Gens, MD in 2 weeks. (office will arrange - sent)    Contact information:   [place] [facility] [phone]         Pt to F/U with Cardiology re: Pradaxa dose - Pharmacy rec decreasing dose from 150 to 75mg  as pt Cr Cl low  Signed: Desmin Daleo J 09/05/2011, 2:24 PM

## 2011-09-06 ENCOUNTER — Encounter (HOSPITAL_COMMUNITY): Payer: Self-pay | Admitting: Vascular Surgery

## 2011-09-06 LAB — CBC
MCH: 30.9 pg (ref 26.0–34.0)
MCHC: 34.5 g/dL (ref 30.0–36.0)
MCV: 89.7 fL (ref 78.0–100.0)
Platelets: 211 10*3/uL (ref 150–400)
RDW: 13.2 % (ref 11.5–15.5)
WBC: 12.3 10*3/uL — ABNORMAL HIGH (ref 4.0–10.5)

## 2011-09-06 LAB — BASIC METABOLIC PANEL
Calcium: 8.5 mg/dL (ref 8.4–10.5)
Creatinine, Ser: 1.33 mg/dL (ref 0.50–1.35)
GFR calc Af Amer: 59 mL/min — ABNORMAL LOW (ref >90)

## 2011-09-06 NOTE — Progress Notes (Signed)
Pt having urinary frequency, urgency, and pain when urinating.  Output past 4 hrs = over 400.  Md on call notified.  Instructed to give pain meds for pain and decrease IVF to [facility].  Done.  Will continue to monitor.   Marlowe Kays RN

## 2011-09-06 NOTE — Progress Notes (Addendum)
VSS, amb: Pt to d/c home, instructions reviewed using teach back, reiterated f/up with cards re Pradaxa dose. Pt and family understood.

## 2011-09-06 NOTE — Progress Notes (Addendum)
[facility] Progress Note  09/06/2011 7:32 AM POD 1  Subjective:  C/o burning and urgency overnight with voiding, but states it is somewhat better this am.  Afebrile x 24 hrs 70s-100s reg 93%RA Filed Vitals:   09/06/11 0725  BP:   Pulse:   Temp: 98.3 F (36.8 C)  Resp:      Physical Exam: Neuro:  In tact without deficit. Incision:  C/d/i-there is some ecchymosis around the incision.  CBC    Component Value Date/Time   WBC 12.3* 09/06/2011 0438   RBC 4.27 09/06/2011 0438   HGB 13.2 09/06/2011 0438   HCT 38.3* 09/06/2011 0438   PLT 211 09/06/2011 0438   MCV 89.7 09/06/2011 0438   MCH 30.9 09/06/2011 0438   MCHC 34.5 09/06/2011 0438   RDW 13.2 09/06/2011 0438    BMET    Component Value Date/Time   NA 138 09/06/2011 0438   K 4.1 09/06/2011 0438   CL 102 09/06/2011 0438   CO2 26 09/06/2011 0438   GLUCOSE 202* 09/06/2011 0438   BUN 16 09/06/2011 0438   CREATININE 1.33 09/06/2011 0438   CALCIUM 8.5 09/06/2011 0438   GFRNONAA 51* 09/06/2011 0438   GFRAA 59* 09/06/2011 0438     Intake/Output Summary (Last 24 hours) at 09/06/11 0732 Last data filed at 09/06/11 [id number]  Gross per 24 hour  Intake 2406.67 ml  Output    975 ml  Net 1431.67 ml      Assessment/Plan:  This is a 77 y.o. male who is s/p right CEA POD 1   -doing well this am. -pt was c/o burning with voiding- ? U/A.  U/A on 08/29/11 was negative.  Will d/w Dr. Kellie Simmering.  Discussed with Dr. Kellie Simmering, given the U/A was normal a week ago and he did not have a foley cath, will not obtain U/A at this time, but if Sx persist, may need to get U/A. -home this am.  [name], PA-C [facility] [phone]

## 2011-09-06 NOTE — Discharge Summary (Signed)
[facility] Discharge Summary  Thomas Snow 28-May-1934 77 y.o. male  [id number]  Admission Date: 09/05/2011  Discharge Date: 09/06/11  Physician: Mal Misty, MD  Admission Diagnosis: RIGHT ICA STENOSIS   HPI:   This is a 77 y.o. male was referred by Dr. Rollene Fare for severe carotid occlusive disease. Patient has been known to have moderate bilateral carotid occlusive disease. In January 2013 he had a 10 minute episode of aphasia which cleared spontaneously. Patient recently has been on Pradaxa 150 mg twice a day. He has had no recurrent symptoms. Followup carotid duplex exam 08/09/2011 and Dr. Lowella Fairy office revealed greater than 80% right ICA stenosis an approximate 50% left ICA stenosis. Patient was referred for possible surgical treatment.  Hospital Course:  The patient was admitted to the hospital and taken to the operating room on 09/05/2011 and underwent right carotid endarterectomy.  The pt tolerated the procedure well and was transported to the PACU in good condition.   By POD 1, the pt neuro status in tact.  He did complain of some burning and urgency when voiding overnight, but that improved when IVF were changed to [facility].    The remainder of the hospital course consisted of increasing mobilization and increasing intake of solids without difficulty.    Basename 09/06/11 0438  NA 138  K 4.1  CL 102  CO2 26  GLUCOSE 202*  BUN 16  CALCIUM 8.5    Basename 09/06/11 0438  WBC 12.3*  HGB 13.2  HCT 38.3*  PLT 211   No results found for this basename: INR:2 in the last 72 hours   Discharge Instructions:   The patient is discharged to home with extensive instructions on wound care and progressive ambulation.  They are instructed not to drive or perform any heavy lifting until returning to see the physician in his office.  Discharge Orders    Future Appointments: Provider: Department: Dept Phone: Center:   09/18/2011 9:45 AM Mal Misty,  MD [facility] [phone] [facility]     Future Orders Please Complete By Expires   Resume previous diet      Driving Restrictions      Comments:   No driving for 2 weeks   Lifting restrictions      Comments:   No lifting for 4 weeks   Call MD for:  temperature >100.5      Call MD for:  redness, tenderness, or signs of infection (pain, swelling, bleeding, redness, odor or green/yellow discharge around incision site)      Call MD for:  severe or increased pain, loss or decreased feeling  in affected limb(s)      Increase activity slowly      Comments:   Walk with assistance use walker or cane as needed   May shower       Scheduling Instructions:   friday   No dressing needed      may wash over wound with mild soap and water      CAROTID Sugery: Call MD for difficulty swallowing or speaking; weakness in arms or legs that is a new symtom; severe headache.  If you have increased swelling in the neck and/or  are having difficulty breathing, CALL 911         Discharge Diagnosis:  RIGHT ICA STENOSIS  Secondary Diagnosis: Patient Active Problem List  Diagnosis  . Occlusion and stenosis of carotid artery without mention of cerebral infarction   Past Medical History  Diagnosis Date  .  Hyperlipidemia   . Hypertension   . Pneumonia     as a child  . Pacemaker   . Arthritis   . Coronary artery disease     prox and mid RCA atherectomy '01  . Carotid artery occlusion     left SCA stent '01, carotid occlusive dz     Adem, Gamm  Home Medication Instructions [id number]   Printed on:09/06/11 [id number]  Medication Information                    amLODipine (NORVASC) 2.5 MG tablet Take 2.5 mg by mouth daily.           ramipril (ALTACE) 10 MG capsule Take 10 mg by mouth daily.           rosuvastatin (CRESTOR) 5 MG tablet Take 5 mg by mouth daily.           aspirin 81 MG tablet Take 81 mg by mouth daily.           oxyCODONE-acetaminophen (ROXICET) 5-325 MG per tablet Take  1 tablet by mouth every 4 (four) hours as needed for pain.           dabigatran (PRADAXA) 75 MG CAPS Take 1 capsule (75 mg total) by mouth every 12 (twelve) hours.             Disposition: home  Patient's condition: is Good  Follow up: 1. Dr.  Kellie Simmering in 2 weeks.   Thomas Locket, PA-C [facility] [phone] 09/06/2011  7:39 AM

## 2011-09-06 NOTE — Progress Notes (Signed)
.  [facility] addendum note:  Pt to F/U with Cardiology re: Pradaxa dose - Pharmacy rec decreasing dose from 150 to 75mg  as pt Cr Cl low  Advised pt to check with cardiologist re: dosing of pradaxa

## 2011-09-07 LAB — TYPE AND SCREEN
ABO/RH(D): B POS
Antibody Screen: NEGATIVE
Unit division: 0

## 2011-09-17 ENCOUNTER — Encounter: Payer: Self-pay | Admitting: Vascular Surgery

## 2011-09-18 ENCOUNTER — Other Ambulatory Visit: Payer: Self-pay | Admitting: *Deleted

## 2011-09-18 ENCOUNTER — Ambulatory Visit (INDEPENDENT_AMBULATORY_CARE_PROVIDER_SITE_OTHER): Payer: Medicare Other | Admitting: Vascular Surgery

## 2011-09-18 ENCOUNTER — Encounter: Payer: Self-pay | Admitting: Vascular Surgery

## 2011-09-18 VITALS — BP 129/43 | HR 85 | Temp 98.3°F | Ht 64.96 in | Wt 115.0 lb

## 2011-09-18 DIAGNOSIS — I6529 Occlusion and stenosis of unspecified carotid artery: Secondary | ICD-10-CM

## 2011-09-18 DIAGNOSIS — Z48812 Encounter for surgical aftercare following surgery on the circulatory system: Secondary | ICD-10-CM

## 2011-09-18 NOTE — Progress Notes (Signed)
Subjective:     Patient ID: Thomas Snow, male   DOB: June 20, 1934, 77 y.o.   MRN: [id number]  HPI this 77 year old male is 2 weeks post right carotid endarterectomy for severe right internal carotid stenosis. The patient had a remote TIA about 7 months preoperatively. He had no recurrent neurologic symptoms since his surgery. He is swallowing well and has no hoarseness. He was takingPradaxa 150 mg twice a day preoperatively which was stopped in the perioperative period. The patient will check with Dr. Rollene Fare to see if the medication should be resumed. He is currently taking aspirin 81 mg per day.  Review of Systems     Objective:   Physical ExamBP 129/43  Pulse 85  Temp 98.3 F (36.8 C) (Oral)  Ht 5' 4.96" (1.65 m)  Wt 115 lb (52.164 kg)  BMI 19.16 kg/m2  SpO2 100%  General well-developed well-nourished male in no apparent stress Right neck incision has healed nicely no evidence of infection or hematoma 3+ carotid pulse with no bruit audible soft bruit on the left Neurologic exam normal     Assessment:     Doing well 2 weeks post right carotid endarterectomy for severe right internal carotid stenosis with remote history of TIA    Plan:     Return in 6 months with carotid duplex exam. Patient has 50% contralateral left internal carotid stenosis. Patient will check with Dr. Rollene Fare regarding hisPradaxa

## 2012-03-17 ENCOUNTER — Encounter: Payer: Self-pay | Admitting: Vascular Surgery

## 2012-03-18 ENCOUNTER — Ambulatory Visit (INDEPENDENT_AMBULATORY_CARE_PROVIDER_SITE_OTHER): Payer: Medicare Other | Admitting: Vascular Surgery

## 2012-03-18 ENCOUNTER — Other Ambulatory Visit (INDEPENDENT_AMBULATORY_CARE_PROVIDER_SITE_OTHER): Payer: Medicare Other | Admitting: *Deleted

## 2012-03-18 ENCOUNTER — Encounter: Payer: Self-pay | Admitting: Vascular Surgery

## 2012-03-18 DIAGNOSIS — I6529 Occlusion and stenosis of unspecified carotid artery: Secondary | ICD-10-CM

## 2012-03-18 DIAGNOSIS — Z48812 Encounter for surgical aftercare following surgery on the circulatory system: Secondary | ICD-10-CM

## 2012-03-18 NOTE — Addendum Note (Signed)
Addended by: Reola Calkins on: 03/18/2012 02:45 PM   Modules accepted: Orders

## 2012-03-18 NOTE — Progress Notes (Signed)
Subjective:     Patient ID: Thomas Snow, male   DOB: 16-Oct-1934, 78 y.o.   MRN: [id number]  HPIthis 78 year old male returns for initial followup regarding his right carotid endarterectomy which I performed in September of 2013. He had suffered an episode of aphasia she has not resolved. Since that time he has done well with no new neurologic symptoms such as lateralizing weakness, aphasia, amaurosis fugax, diplopia, blurred vision, or syncope. He denies arm claudication symptoms. He has previously had a left subclavian stent inserted in 2001 with PTA of this in 2005. He currently takes Pradaxa-75 mg twice a day  Past Medical History  Diagnosis Date  . Hyperlipidemia   . Hypertension   . Pneumonia     as a child  . Pacemaker   . Arthritis   . Coronary artery disease     prox and mid RCA atherectomy '01  . Carotid artery occlusion     left SCA stent '01, carotid occlusive dz    History  Substance Use Topics  . Smoking status: Former Smoker -- 0.50 packs/day for 25 years    Types: Cigarettes    Quit date: 08/19/1996  . Smokeless tobacco: Current User  . Alcohol Use: No    History reviewed. No pertinent family history.  No Known Allergies  Current outpatient prescriptions:amLODipine (NORVASC) 2.5 MG tablet, Take 2.5 mg by mouth daily., Disp: , Rfl: ;  aspirin 81 MG tablet, Take 81 mg by mouth daily., Disp: , Rfl: ;  atorvastatin (LIPITOR) 20 MG tablet, Take 1 tablet by mouth daily., Disp: , Rfl: ;  dabigatran (PRADAXA) 75 MG CAPS, Take 1 capsule (75 mg total) by mouth every 12 (twelve) hours., Disp: 60 capsule, Rfl: 0 fish oil-omega-3 fatty acids 1000 MG capsule, Take 1 g by mouth daily., Disp: , Rfl: ;  Multiple Vitamins-Minerals (CENTRUM PO), Take by mouth daily., Disp: , Rfl: ;  ramipril (ALTACE) 10 MG capsule, Take 10 mg by mouth daily., Disp: , Rfl: ;  rosuvastatin (CRESTOR) 5 MG tablet, Take 5 mg by mouth daily., Disp: , Rfl:   BP 148/62  Pulse 76  Ht 5' 4.9" (1.648 m)   Wt 118 lb 14.4 oz (53.933 kg)  BMI 19.86 kg/m2  SpO2 100%  Body mass index is 19.86 kg/(m^2).           Review of SystemsDenies chest pain, dyspnea on exertion, PND, orthopnea, hemoptysis, claudication. Does complain of right shoulder and right upper extremity pain which he describes as a pulled nerve     Objective:   Physical Examblood pressure 140/62 heart rate 76 respirations 18 Gen.-alert and oriented x3 in no apparent distress HEENT normal for age Lungs no rhonchi or wheezing Cardiovascular regular rhythm no murmurs carotid pulses 3+ palpable-Harsh bruit over left carotid bifurcation and supraclavicular area on left Abdomen soft nontender no palpable masses Musculoskeletal free of  major deformities Skin clear -no rashes Neurologic normal Lower extremities 3+ femoral and dorsalis pedis pulses palpable bilaterally with no edema  Today I ordered a carotid duplex exam which I reviewed and interpreted and compared to previous studies. Right carotid endarterectomy site is widely patent. The left internal carotid artery has a proximal stenosis which has worsened since last study and now is approximately 70% in severity. There is also evidence of proximal right subclavian disease in the left subclavian stent is patent.       Assessment:     Status post right carotid endarterectomy and remote history of left  subclavian stent-now with moderate to moderately severe left ICA stenosis-asymptomatic     Plan:     Return in 9 months for followup carotid duplex exam unless he develops any neurologic symptoms in the interim

## 2012-04-30 ENCOUNTER — Other Ambulatory Visit (HOSPITAL_COMMUNITY): Payer: Self-pay | Admitting: Cardiovascular Disease

## 2012-04-30 DIAGNOSIS — I70219 Atherosclerosis of native arteries of extremities with intermittent claudication, unspecified extremity: Secondary | ICD-10-CM

## 2012-05-21 ENCOUNTER — Ambulatory Visit (HOSPITAL_COMMUNITY)
Admission: RE | Admit: 2012-05-21 | Discharge: 2012-05-21 | Disposition: A | Discharge status: Home / Self Care | Payer: Medicare Other | Source: Ambulatory Visit | Attending: Cardiovascular Disease | Admitting: Cardiovascular Disease

## 2012-05-21 DIAGNOSIS — I70219 Atherosclerosis of native arteries of extremities with intermittent claudication, unspecified extremity: Secondary | ICD-10-CM | POA: Insufficient documentation

## 2012-05-21 NOTE — Progress Notes (Signed)
Arterial Lower Ext. Duplex Completed. Oda Cogan, RDMS, RVT

## 2012-06-18 ENCOUNTER — Telehealth: Payer: Self-pay | Admitting: Cardiovascular Disease

## 2012-06-18 ENCOUNTER — Other Ambulatory Visit: Payer: Self-pay | Admitting: Cardiovascular Disease

## 2012-06-18 DIAGNOSIS — E782 Mixed hyperlipidemia: Secondary | ICD-10-CM

## 2012-06-18 LAB — LIPID PANEL: Cholesterol: 168 mg/dL (ref 0–200)

## 2012-06-18 NOTE — Telephone Encounter (Signed)
Lost his lab order-would you please fax lab order to [facility] downstairs-coming in today!

## 2012-06-18 NOTE — Telephone Encounter (Signed)
Order faxed to lab.  Returned call.  Left message order sent.

## 2012-07-15 ENCOUNTER — Telehealth: Payer: Self-pay | Admitting: Cardiovascular Disease

## 2012-07-15 NOTE — Telephone Encounter (Signed)
Returning call-concerning test results!

## 2012-08-26 ENCOUNTER — Other Ambulatory Visit: Payer: Self-pay | Admitting: *Deleted

## 2012-08-26 DIAGNOSIS — R011 Cardiac murmur, unspecified: Secondary | ICD-10-CM

## 2012-08-26 DIAGNOSIS — I701 Atherosclerosis of renal artery: Secondary | ICD-10-CM

## 2012-08-26 LAB — PACEMAKER DEVICE OBSERVATION

## 2012-09-03 ENCOUNTER — Encounter (HOSPITAL_COMMUNITY): Payer: Self-pay | Admitting: *Deleted

## 2012-09-25 ENCOUNTER — Encounter: Payer: Self-pay | Admitting: Cardiovascular Disease

## 2012-09-25 ENCOUNTER — Ambulatory Visit (HOSPITAL_COMMUNITY)
Admission: RE | Admit: 2012-09-25 | Discharge: 2012-09-25 | Disposition: A | Discharge status: Home / Self Care | Payer: Medicare Other | Source: Ambulatory Visit | Attending: Cardiovascular Disease | Admitting: Cardiovascular Disease

## 2012-09-25 DIAGNOSIS — I701 Atherosclerosis of renal artery: Secondary | ICD-10-CM | POA: Insufficient documentation

## 2012-09-25 DIAGNOSIS — R011 Cardiac murmur, unspecified: Secondary | ICD-10-CM | POA: Insufficient documentation

## 2012-09-25 HISTORY — PX: OTHER SURGICAL HISTORY: SHX169

## 2012-09-25 HISTORY — PX: TRANSTHORACIC ECHOCARDIOGRAM: SHX275

## 2012-09-25 NOTE — Progress Notes (Signed)
Renal Artery Duplex Completed. °Brianna L Mazza,RVT °

## 2012-09-25 NOTE — Progress Notes (Signed)
2D Echo Performed 09/25/2012    Marygrace Drought, RCS

## 2012-09-26 ENCOUNTER — Ambulatory Visit (HOSPITAL_COMMUNITY): Payer: Medicare Other

## 2012-09-26 ENCOUNTER — Encounter (HOSPITAL_COMMUNITY): Payer: Medicare Other

## 2012-10-08 ENCOUNTER — Telehealth: Payer: Self-pay | Admitting: Cardiovascular Disease

## 2012-10-08 NOTE — Telephone Encounter (Signed)
Pt. Called no answer

## 2012-10-08 NOTE — Telephone Encounter (Signed)
Wants Echo and Dopplers results from 9-26 please.

## 2012-12-16 ENCOUNTER — Ambulatory Visit: Payer: Medicare Other | Admitting: Vascular Surgery

## 2012-12-16 ENCOUNTER — Other Ambulatory Visit: Payer: Medicare Other

## 2012-12-22 ENCOUNTER — Encounter: Payer: Self-pay | Admitting: Family

## 2012-12-23 ENCOUNTER — Encounter: Payer: Self-pay | Admitting: Family

## 2012-12-23 ENCOUNTER — Ambulatory Visit (INDEPENDENT_AMBULATORY_CARE_PROVIDER_SITE_OTHER): Payer: Medicare Other | Admitting: Family

## 2012-12-23 ENCOUNTER — Other Ambulatory Visit (HOSPITAL_COMMUNITY): Payer: Medicare Other

## 2012-12-23 ENCOUNTER — Ambulatory Visit (HOSPITAL_COMMUNITY)
Admission: RE | Admit: 2012-12-23 | Discharge: 2012-12-23 | Disposition: A | Discharge status: Home / Self Care | Payer: Medicare Other | Source: Ambulatory Visit | Attending: Vascular Surgery | Admitting: Vascular Surgery

## 2012-12-23 DIAGNOSIS — I6529 Occlusion and stenosis of unspecified carotid artery: Secondary | ICD-10-CM | POA: Insufficient documentation

## 2012-12-23 DIAGNOSIS — Z48812 Encounter for surgical aftercare following surgery on the circulatory system: Secondary | ICD-10-CM

## 2012-12-23 NOTE — Progress Notes (Signed)
Established Carotid Patient   History of Present Illness  Thomas Snow is a 78 y.o. male patient of Dr. Kellie Simmering had a right carotid endarterectomy performed in September of 2013. He had suffered an episode of aphasia. Since that time he has done well with no new neurologic symptoms such as lateralizing weakness, aphasia, amaurosis fugax, diplopia, blurred vision, or syncope. He denies arm claudication symptoms. He has previously had a left subclavian stent inserted in 2001 with PTA of this in 2005. He currently takes Pradaxa-75 mg twice a day.  Over a year ago he injured his right shoulder, this was evaluated, and he states he was told he needs surgery "on the nerves of my right shoulder". He denies claudication symptoms, denies non-healing wounds, he describes arthritis pain in legs.  The patient denies amaurosis fugax or monocular blindness.  The patient  denies facial drooping.  Pt. denies hemiplegia.   Patient denies New Medical or Surgical History.  Pt Diabetic: No Pt smoker: former smoker, quit 18 years ago. Rarely chews tobacco; advised to quit.  Pt meds include: Statin : No: caused severe myalgias, was stopped  ASA: Yes Other anticoagulants/antiplatelets: Pradaxa, for CAD   Past Medical History  Diagnosis Date  . Hyperlipidemia   . Hypertension   . Pneumonia     as a child  . Pacemaker   . Arthritis   . Coronary artery disease     prox and mid RCA atherectomy '01  . Carotid artery occlusion     left SCA stent '01, carotid occlusive dz    Social History History  Substance Use Topics  . Smoking status: Former Smoker -- 0.50 packs/day for 25 years    Types: Cigarettes    Quit date: 08/19/1996  . Smokeless tobacco: Current User  . Alcohol Use: No    Family History Family History  Problem Relation Age of Onset  . Arthritis Father     Surgical History Past Surgical History  Procedure Laterality Date  . Kidney stone surgery    . Cardiac catheterization     . Coronary angioplasty    . Kidney stent      left RAS '05  . Tonsillectomy    . Back surgery      approx 20 years ago  . Eye surgery      bilateral cataract removal  . Knee surgery      right knee  . Cardiac pacemaker placement      [place] '85, '95; [place] '06  . Endarterectomy  09/05/2011    Procedure: ENDARTERECTOMY CAROTID;  Surgeon: Mal Misty, MD;  Location: [facility] OR;  Service: Vascular;  Laterality: Right;  . Carotid endarterectomy Right 09-05-11    cea    No Known Allergies  Current Outpatient Prescriptions  Medication Sig Dispense Refill  . amLODipine (NORVASC) 2.5 MG tablet Take 2.5 mg by mouth daily.      Marland Kitchen aspirin 81 MG tablet Take 81 mg by mouth daily.      Marland Kitchen atorvastatin (LIPITOR) 20 MG tablet Take 1 tablet by mouth daily.      . dabigatran (PRADAXA) 75 MG CAPS Take 1 capsule (75 mg total) by mouth every 12 (twelve) hours.  60 capsule  0  . fish oil-omega-3 fatty acids 1000 MG capsule Take 1 g by mouth daily.      . Multiple Vitamins-Minerals (CENTRUM PO) Take by mouth daily.      . ramipril (ALTACE) 10 MG capsule Take 10 mg  by mouth daily.      . rosuvastatin (CRESTOR) 5 MG tablet Take 5 mg by mouth daily.       No current facility-administered medications for this visit.    Review of Systems : See HPI for pertinent positives and negatives.  Physical Examination  Filed Vitals:   12/23/12 1111  BP: 141/61  Pulse: 60  Resp: 14   Filed Weights   12/23/12 1111  Weight: 116 lb (52.617 kg)   Body mass index is 19.3 kg/(m^2).   General: WDWN male in NAD GAIT: normal Eyes: PERRLA Pulmonary:  CTAB, Negative  Rales, Negative rhonchi, & Negative wheezing.  Cardiac: regular Rhythm ,  Negative detected Murmurs, pacemaker subcutaneous below right clavicle.  VASCULAR EXAM Carotid Bruits Left Right   Positive Positive    Aorta is not palpable. Radial pulses are 1+ palpable and equal.                                                                                                                             LE Pulses LEFT RIGHT       POPLITEAL  not palpable   not palpable       POSTERIOR TIBIAL   palpable    palpable        DORSALIS PEDIS      ANTERIOR TIBIAL not palpable  not palpable     Gastrointestinal: soft, nontender, BS WNL, no r/g,  negative masses.  Musculoskeletal: Negative muscle atrophy/wasting. M/S 5/5 throughout, Extremities without ischemic changes. Pruritic rash medial aspect right lower leg/ankle.  Neurologic: A&O X 3; Appropriate Affect ; SENSATION ;normal;  Speech is normal CN 2-12 intact, Pain and light touch intact in extremities, Motor exam as listed above.   Non-Invasive Vascular Imaging CAROTID DUPLEX 12/23/2012   Right ICA: widely patent CEA site. Left ICA: <40% stenosis.  Previous carotid studies (03/18/12) demonstrated: RICA widely patent CEA site, LICA 40 - 59 % stenosis.  These findings are Improvedin the Left ICA from previous exam.   Assessment: Thomas Snow is a 78 y.o. male who is s/p right carotid endarterectomy performed in September of 2013. He presents with asymptomatic widely patent right ICA which is a CEA site, and minimal left ICA stenosis. The  ICA stenosis has improved in the left ICA  from previous exam.  Plan: Follow-up in 9 months with Carotid Duplex scan.   I discussed in depth with the patient the nature of atherosclerosis, and emphasized the importance of maximal medical management including strict control of blood pressure, blood glucose, and lipid levels, obtaining regular exercise, and continued cessation of smoking.  The patient is aware that without maximal medical management the underlying atherosclerotic disease process will progress, limiting the benefit of any interventions.  The patient was given information about stroke prevention and what symptoms should prompt the patient to seek immediate medical care. The patient was advised to altogether quit the  rare tobacco chewing he is doing.  Thank you for allowing [place] to participate in this patient's care.  Clemon Chambers, RN, MSN, FNP-C [facility] Office: [phone]  Clinic Physician: Early  12/23/2012 11:14 AM

## 2012-12-23 NOTE — Patient Instructions (Addendum)
Stroke Prevention Some medical conditions and behaviors are associated with an increased chance of having a stroke. You may prevent a stroke by making healthy choices and managing medical conditions. Reduce your risk of having a stroke by:  Staying physically active. Get at least 30 minutes of activity on most or all days.  Not smoking. It may also be helpful to avoid exposure to secondhand smoke.  Limiting alcohol use. Moderate alcohol use is considered to be:  No more than 2 drinks per day for men.  No more than 1 drink per day for nonpregnant women.  Eating healthy foods.  Include 5 or more servings of fruits and vegetables a day.  Certain diets may be prescribed to address high blood pressure, high cholesterol, diabetes, or obesity.  Managing your cholesterol levels.  A low-saturated fat, low-trans fat, low-cholesterol, and high-fiber diet may control cholesterol levels.  Take any prescribed medicines to control cholesterol as directed by your caregiver.  Managing your diabetes.  A controlled-carbohydrate, controlled-sugar diet is recommended to manage diabetes.  Take any prescribed medicines to control diabetes as directed by your caregiver.  Controlling your high blood pressure (hypertension).  A low-salt (sodium), low-saturated fat, low-trans fat, and low-cholesterol diet is recommended to manage high blood pressure.  Take any prescribed medicines to control hypertension as directed by your caregiver.  Maintaining a healthy weight.  A reduced-calorie, low-sodium, low-saturated fat, low-trans fat, low-cholesterol diet is recommended to manage weight.  Stopping drug abuse.  Avoiding birth control pills.  Talk to your caregiver about the risks of taking birth control pills if you are over 85 years old, smoke, get migraines, or have ever had a blood clot.  Getting evaluated for sleep disorders (sleep apnea).  Talk to your caregiver about getting a sleep evaluation  if you snore a lot or have excessive sleepiness.  Taking medicines as directed by your caregiver.  For some people, aspirin or blood thinners (anticoagulants) are helpful in reducing the risk of forming abnormal blood clots that can lead to stroke. If you have the irregular heart rhythm of atrial fibrillation, you should be on a blood thinner unless there is a good reason you cannot take them.  Understand all your medicine instructions. SEEK IMMEDIATE MEDICAL CARE IF:   You have sudden weakness or numbness of the face, arm, or leg, especially on one side of the body.  You have sudden confusion.  You have trouble speaking (aphasia) or understanding.  You have sudden trouble seeing in one or both eyes.  You have sudden trouble walking.  You have dizziness.  You have a loss of balance or coordination.  You have a sudden, severe headache with no known cause.  You have new chest pain or an irregular heartbeat. Any of these symptoms may represent a serious problem that is an emergency. Do not wait to see if the symptoms will go away. Get medical help right away. Call your local emergency services (911 in [place] Do not drive yourself to the hospital. Document Released: 01/26/2004 Document Revised: 03/12/2011 Document Reviewed: 06/20/2012 [facility] Patient Information 2014 [place].   Smokeless Tobacco Use Smokeless tobacco is a loose, fine, or stringy tobacco. The tobacco is not smoked like a cigarette, but it is chewed or held in the lips or cheeks. It resembles tea and comes from the leaves of the tobacco plant. Smokeless tobacco is usually flavored, sweetened, or processed in some way. Although smokeless tobacco is not smoked into the lungs, its chemicals are absorbed  through the membranes in the mouth and into the bloodstream. Its chemicals are also swallowed in saliva. The chemicals (nicotine and other toxins) are known to cause cancer. Smokeless tobacco contains up to 28  differentcarcinogens. CAUSES Nicotine is addictive. Smokeless tobacco contains nicotine, which is a stimulant. This stimulant can give you a "buzz" or altered state. People can become addicted to the feeling it delivers.  SYMPTOMS Smokeless tobacco can cause health problems, including:  Bad breath.  Yellow-brown teeth.  Mouth sores.  Cracking and bleeding lips.  Gum disease, gum recession, and bone loss around the teeth.  Tooth decay.  Increased or irregular heart rate.  High blood pressure, heart disease, and stroke.  Cancer of the mouth, lips, tongue, pancreas, voice box (larynx), esophagus, colon, and bladder.  Precancerous lesion of the soft tissues of the mouth (leukoplakia).  Loss of your sense of taste. TREATMENT Talk with your caregiver about ways you can quit. Quitting tobacco is a good decision for your health. Nicotine is addictive, but several options are available to help you quit including:  Nicotine replacement therapy (gum or patch).  Support and cessation programs. The following tips can help you quit:  Write down the reasons you would like to quit and look at them often.  Set a date during a low stress time to stop or cut back.  Ask family and friends for their support.  Remove all tobacco products from your home and work.  Replace the chewing tobacco with things like beef jerky, sunflower seeds, or shredded coconut.  Avoid situations that may make you want to chew tobacco.  Exercise and eat a healthy diet.  When you crave tobacco, distract yourself with drinking water, sugarless chewing gum, sugarless hard candy, exercising, or deep breathing. HOME CARE INSTRUCTIONS  See your dentist for regular oral health exams every 6 months.  Follow up with your caregiver as recommended. SEEK MEDICAL CARE OR DENTAL CARE IF:  You have bleeding or cracking lips, gums, or cheeks.  You have mouth sores, discolorations, or pain.  You have tooth  pain.  You develop persistent irritation, burning, or sores in the mouth.  You have pain, tenderness, or numbness in the mouth.  You develop a lump, bumpy patch, or hardened skin inside the mouth.  The color changes inside your mouth (gray, white, or red spots).  You have difficulty chewing, swallowing, or speaking. Document Released: 05/22/2010 Document Revised: 03/12/2011 Document Reviewed: 05/22/2010 [facility] Patient Information 2014 [place], [place].

## 2013-02-19 ENCOUNTER — Other Ambulatory Visit: Payer: Self-pay | Admitting: *Deleted

## 2013-02-19 MED ORDER — DABIGATRAN ETEXILATE MESYLATE 75 MG PO CAPS: 75.0000 mg | ORAL_CAPSULE | Freq: Two times a day (BID) | ORAL | Status: DC

## 2013-02-19 MED ORDER — RAMIPRIL 10 MG PO CAPS: 10.0000 mg | ORAL_CAPSULE | Freq: Every day | ORAL | Status: DC

## 2013-02-19 NOTE — Telephone Encounter (Signed)
Per Dalene Seltzer, Scheduling, pt's wife called and pt needs refills on Pradaxa and ramipril.  Informed 30-day refill can be sent and pt needs an appt before further refills given.  Verbalized understanding and will call pt.  Stated pt due for Phys Pacer Check and will be scheduled w/ Dr. Sallyanne Kuster.  Refill(s) sent to pharmacy: [facility] in [place].

## 2013-03-12 ENCOUNTER — Ambulatory Visit: Payer: Medicare Other | Admitting: Cardiovascular Disease

## 2013-03-25 ENCOUNTER — Other Ambulatory Visit: Payer: Self-pay | Admitting: *Deleted

## 2013-03-25 ENCOUNTER — Other Ambulatory Visit: Payer: Self-pay | Admitting: Cardiovascular Disease

## 2013-03-25 NOTE — Telephone Encounter (Signed)
Rx was sent to pharmacy electronically. 

## 2013-03-31 ENCOUNTER — Ambulatory Visit: Payer: Medicare Other | Admitting: Cardiovascular Disease

## 2013-04-01 ENCOUNTER — Encounter: Payer: Self-pay | Admitting: Cardiovascular Disease

## 2013-04-01 ENCOUNTER — Ambulatory Visit (INDEPENDENT_AMBULATORY_CARE_PROVIDER_SITE_OTHER): Payer: Commercial Managed Care - HMO | Admitting: Cardiovascular Disease

## 2013-04-01 VITALS — BP 110/60 | HR 88 | Resp 16 | Ht 65.0 in | Wt 112.8 lb

## 2013-04-01 DIAGNOSIS — I4891 Unspecified atrial fibrillation: Secondary | ICD-10-CM

## 2013-04-01 DIAGNOSIS — E782 Mixed hyperlipidemia: Secondary | ICD-10-CM

## 2013-04-01 DIAGNOSIS — Z950 Presence of cardiac pacemaker: Secondary | ICD-10-CM

## 2013-04-01 DIAGNOSIS — I455 Other specified heart block: Secondary | ICD-10-CM

## 2013-04-01 DIAGNOSIS — Z79899 Other long term (current) drug therapy: Secondary | ICD-10-CM

## 2013-04-01 DIAGNOSIS — N183 Chronic kidney disease, stage 3 unspecified: Secondary | ICD-10-CM

## 2013-04-01 DIAGNOSIS — I739 Peripheral vascular disease, unspecified: Secondary | ICD-10-CM

## 2013-04-01 DIAGNOSIS — R636 Underweight: Secondary | ICD-10-CM | POA: Insufficient documentation

## 2013-04-01 DIAGNOSIS — I771 Stricture of artery: Secondary | ICD-10-CM

## 2013-04-01 DIAGNOSIS — I658 Occlusion and stenosis of other precerebral arteries: Secondary | ICD-10-CM

## 2013-04-01 DIAGNOSIS — N179 Acute kidney failure, unspecified: Secondary | ICD-10-CM | POA: Insufficient documentation

## 2013-04-01 DIAGNOSIS — I480 Paroxysmal atrial fibrillation: Secondary | ICD-10-CM

## 2013-04-01 DIAGNOSIS — I701 Atherosclerosis of renal artery: Secondary | ICD-10-CM

## 2013-04-01 DIAGNOSIS — I6529 Occlusion and stenosis of unspecified carotid artery: Secondary | ICD-10-CM

## 2013-04-01 DIAGNOSIS — I2510 Atherosclerotic heart disease of native coronary artery without angina pectoris: Secondary | ICD-10-CM | POA: Insufficient documentation

## 2013-04-01 DIAGNOSIS — I6523 Occlusion and stenosis of bilateral carotid arteries: Secondary | ICD-10-CM | POA: Insufficient documentation

## 2013-04-01 DIAGNOSIS — I774 Celiac artery compression syndrome: Secondary | ICD-10-CM | POA: Insufficient documentation

## 2013-04-01 DIAGNOSIS — E785 Hyperlipidemia, unspecified: Secondary | ICD-10-CM | POA: Insufficient documentation

## 2013-04-01 LAB — PACEMAKER DEVICE OBSERVATION

## 2013-04-01 MED ORDER — PRAVASTATIN SODIUM 20 MG PO TABS: 20.0000 mg | ORAL_TABLET | Freq: Every evening | ORAL | Status: DC

## 2013-04-01 NOTE — Assessment & Plan Note (Signed)
Episodes of atrial fibrillation are asymptomatic and relatively brief with a total of 10 hours of atrial fibrillation in the last 6 months. Ventricular rate control is acceptable during atrial fibrillation. No changes are made to his medications. Continue anticoagulation.

## 2013-04-01 NOTE — Assessment & Plan Note (Signed)
He has a poor appetite which explains his weight loss. 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 He does not describe intestinal angina but one wonders whether this could be the cause of his weight loss. Consider referral to Dr. Alvester Chou. Will at least ask him for an opinion. Possibly could benefit from SMA stent.

## 2013-04-01 NOTE — Patient Instructions (Signed)
START PRAVASTATIN 20MG  DAILY AT BEDTIME.  Your physician recommends that you return for lab work in: [place] AUGUST 2015.  Your physician recommends that you schedule a follow-up appointment in: 6 MONTHS.

## 2013-04-01 NOTE — Assessment & Plan Note (Signed)
Symptomatic. He received a stent to the left subclavian artery in 2001. By the most recent ultrasound performed in 2013 there were bilateral 50-70% lesions. No subclavian steal syndrome or upper extremity claudication.

## 2013-04-01 NOTE — Assessment & Plan Note (Addendum)
Roughly 10 years status post placement of his stent. He has mildly abnormal renal function and has excellent blood pressure. Most recent duplex ultrasound was performed in September of 2014 and did not show significant stenoses, although there was slight increase in bilateral renal artery velocities (renal-aortic ratio roughly 2.2:1).

## 2013-04-01 NOTE — Assessment & Plan Note (Signed)
No symptoms of coronary disease. Remote Cut[place] Balloon endoplastic without stent placement to the right coronary 2001. Normal perfusion by nuclear study in 2012. Normal left ventricle systolic function. No symptoms of heart failure.

## 2013-04-01 NOTE — Assessment & Plan Note (Addendum)
Acceptable lipid levels on statin therapy, but he is intolerant to both atorvastatin and Crestor. It seemed that he tolerated pravastatin well for years. We'll restart this at a low dose. Even if we do not have an LDL cholesterol less than 70, it'll be better than no treatment at all. Especially in view of his side effects and dramatic weight loss I do not think we should add a second agent for his mild hypertriglyceridemia

## 2013-04-01 NOTE — Progress Notes (Signed)
Patient ID: Thomas Snow, male   DOB: December 31, 1934, 79 y.o.   MRN: [id number]      Reason for office visit CAD, PAD, pacemaker  This is my first encounter with [name] who has been a patient of Dr. Terance Ice for almost 40 years.   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 He has not had any syncope since implantation of a pacemaker in 1985. He still has the original unipolar atrial and ventricular leads are implanted at that time but has undergone numerous generator change outs, most recently in 2006 when he received a [employer] Jude dual-chamber device. His pacemaker has recorded repeated episodes of paroxysmal atrial fibrillation which is asymptomatic. He is on anticoagulation therapy.  He has subsequently developed numerous vascular problems involving multiple arterial territories. He had Cutting Balloon angioplasty for a right coronary artery stenosis in 2001, without stent placement. Ischemia was detected by nuclear testing and he never had angina or dyspnea. He remains asymptomatic and had a low-risk nuclear study in 2012. He has preserved left ventricular systolic function. 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 He is now asymptomatic from the standpoint and his most recent ultrasound shows moderate bilateral subclavian artery stenosis. He received a stent to the left renal artery in 2005. His most recent ultrasound shows mild bilateral renal artery stenosis, less than 60%. He had a transient ischemic attack in 2013 and underwent a right carotid endarterectomy.   He has only one complaint and that is his inability to gain weight. He lost about 20 pounds over a brief period of time which she attributes to taking vinegar and soda to help heal an oral ulcer. His appetite is poor and his oral intake is low. She denies any abdominal pain with eating or any other symptoms  that might suggest mesenteric angina. He simply has early satiety. He is frankly underweight with a BMI around 18. 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 I'm not sure that there is a clear-cut cause or relationship.  Allergies  Allergen Reactions  . Atorvastatin     Leg pain  . Crestor [Rosuvastatin]     Leg pain    Current Outpatient Prescriptions  Medication Sig Dispense Refill  . amLODipine (NORVASC) 2.5 MG tablet Take 2.5 mg by mouth daily. Patient taking it occas.      Marland Kitchen aspirin 81 MG tablet Take 81 mg by mouth daily.      . dabigatran (PRADAXA) 75 MG CAPS capsule Take 1 capsule (75 mg total) by mouth every 12 (twelve) hours. Appointment needed for refills.  60 capsule  0  . fish oil-omega-3 fatty acids 1000 MG capsule Take 1 g by mouth daily.      . ramipril (ALTACE) 10 MG capsule TAKE ONE CAPSULE BY MOUTH ONCE DAILY APPOINTMENT NEEDED FOR REFILLS  10 capsule  0  . pravastatin (PRAVACHOL) 20 MG tablet Take 1 tablet (20 mg total) by mouth every evening.  30 tablet  6   No current facility-administered medications for this visit.    Past Medical History  Diagnosis Date  . Hyperlipidemia   . Hypertension   . Pneumonia     as a child  . Pacemaker   . Arthritis   . Coronary artery disease     prox and mid  RCA atherectomy '01  . Carotid artery occlusion     left SCA stent '01, carotid occlusive dz    Past Surgical History  Procedure Laterality Date  . Kidney stone surgery    . Cardiac catheterization    . Coronary angioplasty    . Kidney stent      left RAS '05  . Tonsillectomy    . Back surgery      approx 20 years ago  . Eye surgery      bilateral cataract removal  . Knee surgery      right knee  . Cardiac pacemaker placement      [place] '85, '95; [place] '06  . Endarterectomy  09/05/2011    Procedure: ENDARTERECTOMY CAROTID;  Surgeon:  Mal Misty, MD;  Location: [facility];  Service: Vascular;  Laterality: Right;  . Carotid endarterectomy Right 09-05-11    cea  . Peripheral vascular angiogram  01/22/2003    Left subclavian 85% fairly focal in-stent restenosis, dilated with a 7x2cm Powerflex balloon at 10-30 and 10-30, resulting in less than 10% residual narrowing. Left renal artery demonstrated 80-85% initially dilated with a 4x15 mm coronary cutting balloon at 10-30 and 12-30, exchanged for a 69mmx2cm Aviator balloon inflations were done at 6-15, 10-30, and 10-30, resulting less than 10% residual.  . Peripheral vascular angiogram  07/13/1999    Left subclavian 85% dilated with a 61mmx2cm Cordis Power-Flex balloon at 6-40, a P-204 iliac stent was hand-crimped on a 3mmx2cm Cordis Power-Flex balloon, expanded at 8-40 with reduction to 0%. Left Renal artery 75-80% stenosis, dilated with a 66mmx1.5cm Cordis Power-Flex balloon at 6-40, reduction of stenosis from 80% to 0%.  . Renal doppler  09/25/2012    Celiac artery and SMA-demonstrates narrowing with increased velocities consistent with greater than 50%, right renal- 1-59% diameter reduction, left renal artery stent- 1-59% diameter reduction.  . Cardiac catheterization  03/09/1999    RCA high-grade 95% stenosis, inflated with a 3.25 IVT cutting balloon at 6-64, 6-41, 6-52, and 6-53, resulting in reductiong of less than 10%. RCA proximal 75% stenosis inflated with a 2.75x10 IVT cutting balloon at 6-65 then exchanged for a 3x10 IVT cutting balloon inflated at 6-83.  . Cardiovascular stress test  08/09/2010    Normal pattern of perfusion in all regions. No ECG changes. EKG negative for ischemia.  . Transthoracic echocardiogram  09-25-2012    EF 55-60%, mild-moderate tricuspid valve regurg  . Icd generator change  10/20/2004    Implantation of [facility], model # 5357M/s, serial # [id number]    Family History  Problem Relation Age of Onset  . Arthritis Father     History    Social History  . Marital Status: Married    Spouse Name: N/A    Number of Children: N/A  . Years of Education: N/A   Occupational History  . Not on file.   Social History Main Topics  . Smoking status: Former Smoker -- 0.50 packs/day for 25 years    Types: Cigarettes    Quit date: 08/19/1996  . Smokeless tobacco: Current User  . Alcohol Use: No  . Drug Use: No  . Sexual Activity: Not on file   Other Topics Concern  . Not on file   Social History Narrative  . No narrative on file    Review of systems: Inability to gain weight, poor appetite. Bilateral knee arthralgia limits him more than any other symptoms The patient specifically denies any  chest pain at rest or with exertion, dyspnea at rest or with exertion, orthopnea, paroxysmal nocturnal dyspnea, syncope, palpitations, focal neurological deficits, intermittent claudication, lower extremity edema, unexplained weight gain, cough, hemoptysis or wheezing.  The patient also denies abdominal pain, nausea, vomiting, dysphagia, diarrhea, constipation, polyuria, polydipsia, dysuria, hematuria, frequency, urgency, abnormal bleeding or bruising, fever, chills, unexpected weight changes, mood swings, change in skin or hair texture, change in voice quality, auditory or visual problems, allergic reactions or rashes, new musculoskeletal complaints other than usual "aches and pains".   PHYSICAL EXAM BP 110/60  Pulse 88  Resp 16  Ht 5\' 5"  (1.651 m)  Wt 51.166 kg (112 lb 12.8 oz)  BMI 18.77 kg/m2 Equal blood pressure in the right and left upper extremities General: Alert, oriented x3, no distress. Underweight Head: no evidence of trauma, PERRL, EOMI, no exophtalmos or lid lag, no myxedema, no xanthelasma; normal ears, nose and oropharynx Neck: normal jugular venous pulsations and no hepatojugular reflux; brisk carotid pulses without delay; scar of right carotid endarterectomy; loud left carotid bruit Chest: clear to auscultation, no  signs of consolidation by percussion or palpation, normal fremitus, symmetrical and full respiratory excursions. The right subclavian pacemaker site is well-healed. There are very obvious large tortuous venous collaterals overlying the right shoulder and right upper chest, including right over his pacemaker. Cardiovascular: normal position and quality of the apical impulse, regular rhythm, normal first and second heart sounds, no murmurs, rubs or gallops Abdomen: no tenderness or distention, no masses by palpation, no abnormal pulsatility ; there are loud systolic bruits heard immediately above the umbilicus and in the left flank;, normal bowel sounds, no hepatosplenomegaly Extremities: no clubbing, cyanosis or edema; 2+ radial, ulnar and brachial pulses bilaterally; 2+ right femoral, posterior tibial and dorsalis pedis pulses; 2+ left femoral, posterior tibial and dorsalis pedis pulses; he has loud bilateral subclavian and bilateral femoral bruits Neurological: grossly nonfocal   EKG: Normal sinus rhythm  Interrogation of his pacemaker shows 6% atrial pacing less than 1% ventricular pacing. Up in 366 episodes of mode switch mostly representing atrophic relation with the longest episode lasting about 3 hours. Ventricular rate control appears to be fair. The ventricular pacing threshold is moderately elevated 1.75 V at 0.6 ms pulse width, the atrial pacing threshold is minimally elevated at 1.25 V 0.6 ms pulse width. Lead impedance and sensing is good on both leads. Estimated generator longevity is between 3 and 9 years, a broad range because of the high voltage required for ventricular pacing  Lipid Panel     Component Value Date/Time   CHOL 168 06/18/2012 1043   TRIG 183* 06/18/2012 1043   HDL 39* 06/18/2012 1043   CHOLHDL 4.3 06/18/2012 1043   VLDL 37 06/18/2012 1043   LDLCALC 92 06/18/2012 1043    BMET    Component Value Date/Time   NA 138 09/06/2011 0438   K 4.1 09/06/2011 0438   CL 102 09/06/2011  0438   CO2 26 09/06/2011 0438   GLUCOSE 202* 09/06/2011 0438   BUN 16 09/06/2011 0438   CREATININE 1.33 09/06/2011 0438   CALCIUM 8.5 09/06/2011 0438   GFRNONAA 51* 09/06/2011 0438   GFRAA 59* 09/06/2011 0438     ASSESSMENT AND PLAN Subclavian arterial stenosis, bilateral Symptomatic. He received a stent to the left subclavian artery in 2001. By the most recent ultrasound performed in 2013 there were bilateral 50-70% lesions. No subclavian steal syndrome or upper extremity claudication.  Left renal artery stent 2005 Roughly 10  years status post placement of his stent. He has mildly abnormal renal function and has excellent blood pressure. Most recent duplex ultrasound was performed in September of 2014 and did not show significant stenoses, although there was slight increase in bilateral renal artery velocities (renal-aortic ratio roughly 2.2:1).  CAD s/p RCA angioplasty 2001 No symptoms of coronary disease. Remote Cutting Balloon endoplastic without stent placement to the right coronary 2001. Normal perfusion by nuclear study in 2012. Normal left ventricle systolic function. No symptoms of heart failure.  Underweight He has a poor appetite which explains his weight loss. 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 He does not describe intestinal angina but one wonders whether this could be the cause of his weight loss. Consider referral to Dr. Alvester Chou. Will at least ask him for an opinion. Possibly could benefit from SMA stent.  Pacemaker - dual chamber, implanted in 1985, last generator change 2006, [employer]. Jude Developed intermittent sinus node arrest with syncope at a very young age. He has had the same pacemaker leads in place since 1985. He has a slightly elevated pacing thresholds on the right ventricular lead that she paces the ventricle very rarely. No  reprogramming changes performed today. His physical exam suggests collateral venous circulation due to suspected occlusion of the right subclavian vein. If lead replacement is necessary he will require an entirely new system on the opposite side. For now device function is normal. Unfortunately his pacemaker is not amenable to remote monitoring. We'll see him in the clinic every 6 months.   Hyperlipidemia Acceptable lipid levels on statin therapy. Especially in view of his dramatic weight loss I do not think we should add a second agent for his mild hypertriglyceridemia  Paroxysmal atrial fibrillation Episodes of atrial fibrillation are asymptomatic and relatively brief with a total of 10 hours of atrial fibrillation in the last 6 months. Ventricular rate control is acceptable during atrial fibrillation. No changes are made to his medications. Continue anticoagulation.   Orders Placed This Encounter  Procedures  . Lipid panel  . Comprehensive metabolic panel  . EKG 12-Lead   Meds ordered this encounter  Medications  . pravastatin (PRAVACHOL) 20 MG tablet    Sig: Take 1 tablet (20 mg total) by mouth every evening.    Dispense:  30 tablet    Refill:  [place] Shawndell Varas, MD, [facility] [phone] office [phone] pager

## 2013-04-01 NOTE — Assessment & Plan Note (Signed)
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 He has had the same pacemaker leads in place since 1985. He has a slightly elevated pacing thresholds on the right ventricular lead that she paces the ventricle very rarely. No reprogramming changes performed today. His physical exam suggests collateral venous circulation due to suspected occlusion of the right subclavian vein. If lead replacement is necessary he will require an entirely new system on the opposite side. For now device function is normal. Unfortunately his pacemaker is not amenable to remote monitoring. We'll see him in the clinic every 6 months.

## 2013-04-02 LAB — MDC_IDC_ENUM_SESS_TYPE_INCLINIC
Battery Voltage: 2.78 V
Brady Statistic RA Percent Paced: 6.2 %
Brady Statistic RV Percent Paced: 1 % — CL
Implantable Pulse Generator Serial Number: 1598850
Lead Channel Impedance Value: 443 Ohm
Lead Channel Impedance Value: 474 Ohm
Lead Channel Pacing Threshold Amplitude: 1.75 V
Lead Channel Pacing Threshold Pulse Width: 0.6 ms
Lead Channel Pacing Threshold Pulse Width: 0.6 ms
Lead Channel Sensing Intrinsic Amplitude: 5.9 mV
Lead Channel Setting Pacing Amplitude: 3 V
Lead Channel Setting Sensing Sensitivity: 3 mV
MDC IDC MSMT LEADCHNL RA PACING THRESHOLD AMPLITUDE: 1.25 V
MDC IDC MSMT LEADCHNL RA SENSING INTR AMPL: 1.3 mV
MDC IDC SET LEADCHNL RA PACING AMPLITUDE: 2.75 V
MDC IDC SET LEADCHNL RV PACING PULSEWIDTH: 0.6 ms

## 2013-04-15 ENCOUNTER — Telehealth: Payer: Self-pay | Admitting: Cardiovascular Disease

## 2013-04-15 ENCOUNTER — Other Ambulatory Visit: Payer: Self-pay | Admitting: Cardiovascular Disease

## 2013-04-15 ENCOUNTER — Encounter: Payer: Self-pay | Admitting: Cardiovascular Disease

## 2013-04-15 MED ORDER — RAMIPRIL 10 MG PO CAPS: 10.0000 mg | ORAL_CAPSULE | Freq: Every day | ORAL | Status: DC

## 2013-04-15 NOTE — Telephone Encounter (Signed)
Forwarded to [place], [place].

## 2013-04-15 NOTE — Telephone Encounter (Signed)
Ramipril refill electronically.

## 2013-04-15 NOTE — Telephone Encounter (Signed)
Need a new prescription for his blood pressure medicine,Pt did not know the nameof his bloood pressure medicine. She think one is Ramapril. Please call to [facility]-[phone].

## 2013-04-16 ENCOUNTER — Other Ambulatory Visit: Payer: Self-pay | Admitting: *Deleted

## 2013-09-28 ENCOUNTER — Encounter: Payer: Self-pay | Admitting: Family

## 2013-09-29 ENCOUNTER — Ambulatory Visit (HOSPITAL_COMMUNITY)
Admission: RE | Admit: 2013-09-29 | Discharge: 2013-09-29 | Disposition: A | Discharge status: Home / Self Care | Payer: Medicare HMO | Source: Ambulatory Visit | Attending: Family | Admitting: Family

## 2013-09-29 ENCOUNTER — Encounter: Payer: Self-pay | Admitting: Family

## 2013-09-29 ENCOUNTER — Ambulatory Visit (INDEPENDENT_AMBULATORY_CARE_PROVIDER_SITE_OTHER): Payer: Commercial Managed Care - HMO | Admitting: Family

## 2013-09-29 VITALS — BP 130/58 | HR 60 | Resp 16 | Ht 65.0 in | Wt 110.0 lb

## 2013-09-29 DIAGNOSIS — Z48812 Encounter for surgical aftercare following surgery on the circulatory system: Secondary | ICD-10-CM

## 2013-09-29 DIAGNOSIS — I6529 Occlusion and stenosis of unspecified carotid artery: Secondary | ICD-10-CM

## 2013-09-29 DIAGNOSIS — K551 Chronic vascular disorders of intestine: Secondary | ICD-10-CM

## 2013-09-29 DIAGNOSIS — K559 Vascular disorder of intestine, unspecified: Secondary | ICD-10-CM | POA: Insufficient documentation

## 2013-09-29 NOTE — Patient Instructions (Addendum)
Stroke Prevention Some medical conditions and behaviors are associated with an increased chance of having a stroke. You may prevent a stroke by making healthy choices and managing medical conditions. HOW CAN I REDUCE MY RISK OF HAVING A STROKE?   Stay physically active. Get at least 30 minutes of activity on most or all days.  Do not smoke. It may also be helpful to avoid exposure to secondhand smoke.  Limit alcohol use. Moderate alcohol use is considered to be:  No more than 2 drinks per day for men.  No more than 1 drink per day for nonpregnant women.  Eat healthy foods. This involves:  Eating 5 or more servings of fruits and vegetables a day.  Making dietary changes that address high blood pressure (hypertension), high cholesterol, diabetes, or obesity.  Manage your cholesterol levels.  Making food choices that are high in fiber and low in saturated fat, trans fat, and cholesterol may control cholesterol levels.  Take any prescribed medicines to control cholesterol as directed by your health care provider.  Manage your diabetes.  Controlling your carbohydrate and sugar intake is recommended to manage diabetes.  Take any prescribed medicines to control diabetes as directed by your health care provider.  Control your hypertension.  Making food choices that are low in salt (sodium), saturated fat, trans fat, and cholesterol is recommended to manage hypertension.  Take any prescribed medicines to control hypertension as directed by your health care provider.  Maintain a healthy weight.  Reducing calorie intake and making food choices that are low in sodium, saturated fat, trans fat, and cholesterol are recommended to manage weight.  Stop drug abuse.  Avoid taking birth control pills.  Talk to your health care provider about the risks of taking birth control pills if you are over 35 years old, smoke, get migraines, or have ever had a blood clot.  Get evaluated for sleep  disorders (sleep apnea).  Talk to your health care provider about getting a sleep evaluation if you snore a lot or have excessive sleepiness.  Take medicines only as directed by your health care provider.  For some people, aspirin or blood thinners (anticoagulants) are helpful in reducing the risk of forming abnormal blood clots that can lead to stroke. If you have the irregular heart rhythm of atrial fibrillation, you should be on a blood thinner unless there is a good reason you cannot take them.  Understand all your medicine instructions.  Make sure that other conditions (such as anemia or atherosclerosis) are addressed. SEEK IMMEDIATE MEDICAL CARE IF:   You have sudden weakness or numbness of the face, arm, or leg, especially on one side of the body.  Your face or eyelid droops to one side.  You have sudden confusion.  You have trouble speaking (aphasia) or understanding.  You have sudden trouble seeing in one or both eyes.  You have sudden trouble walking.  You have dizziness.  You have a loss of balance or coordination.  You have a sudden, severe headache with no known cause.  You have new chest pain or an irregular heartbeat. Any of these symptoms may represent a serious problem that is an emergency. Do not wait to see if the symptoms will go away. Get medical help at once. Call your local emergency services (911 in [place] Do not drive yourself to the hospital. Document Released: 01/26/2004 Document Revised: 05/04/2013 Document Reviewed: 06/20/2012 [employer] Patient Information 2015 [employer]. This information is not intended to replace advice given   to you by your health care provider. Make sure you discuss any questions you have with your health care provider.   Smokeless Tobacco Use Smokeless tobacco is a loose, fine, or stringy tobacco. The tobacco is not smoked like a cigarette, but it is chewed or held in the lips or cheeks. It resembles tea and comes from the  leaves of the tobacco plant. Smokeless tobacco is usually flavored, sweetened, or processed in some way. Although smokeless tobacco is not smoked into the lungs, its chemicals are absorbed through the membranes in the mouth and into the bloodstream. Its chemicals are also swallowed in saliva. The chemicals (nicotine and other toxins) are known to cause cancer. Smokeless tobacco contains up to 28 differentcarcinogens. CAUSES Nicotine is addictive. Smokeless tobacco contains nicotine, which is a stimulant. This stimulant can give you a "buzz" or altered state. People can become addicted to the feeling it delivers.  SYMPTOMS Smokeless tobacco can cause health problems, including:  Bad breath.  Yellow-brown teeth.  Mouth sores.  Cracking and bleeding lips.  Gum disease, gum recession, and bone loss around the teeth.  Tooth decay.  Increased or irregular heart rate.  High blood pressure, heart disease, and stroke.  Cancer of the mouth, lips, tongue, pancreas, voice box (larynx), esophagus, colon, and bladder.  Precancerous lesion of the soft tissues of the mouth (leukoplakia).  Loss of your sense of taste. TREATMENT Talk with your caregiver about ways you can quit. Quitting tobacco is a good decision for your health. Nicotine is addictive, but several options are available to help you quit including:  Nicotine replacement therapy (gum or patch).  Support and cessation programs. The following tips can help you quit:  Write down the reasons you would like to quit and look at them often.  Set a date during a low stress time to stop or cut back.  Ask family and friends for their support.  Remove all tobacco products from your home and work.  Replace the chewing tobacco with things like beef jerky, sunflower seeds, or shredded coconut.  Avoid situations that may make you want to chew tobacco.  Exercise and eat a healthy diet.  When you crave tobacco, distract yourself with  drinking water, sugarless chewing gum, sugarless hard candy, exercising, or deep breathing. HOME CARE INSTRUCTIONS  See your dentist for regular oral health exams every 6 months.  Follow up with your caregiver as recommended. SEEK MEDICAL CARE OR DENTAL CARE IF:  You have bleeding or cracking lips, gums, or cheeks.  You have mouth sores, discolorations, or pain.  You have tooth pain.  You develop persistent irritation, burning, or sores in the mouth.  You have pain, tenderness, or numbness in the mouth.  You develop a lump, bumpy patch, or hardened skin inside the mouth.  The color changes inside your mouth (gray, white, or red spots).  You have difficulty chewing, swallowing, or speaking. Document Released: 05/22/2010 Document Revised: 03/12/2011 Document Reviewed: 05/22/2010 [facility] Patient Information 2015 [place], [place]. This information is not intended to replace advice given to you by your health care provider. Make sure you discuss any questions you have with your health care provider.   Chronic Mesenteric Ischemia Mesenteric ischemia is a deficiency of blood in an area of the intestine supplied by an artery that supports the intestine. Chronic mesenteric ischemia, also called intestinal angina, is a long-term condition. It happens when an artery or vein that supports the intestine gradually becomes blocked or narrow, restricting the blood supply  to the intestine. When the blood supply to the intestine is severely restricted, the intestines cannot function properly because needed oxygen cannot reach them.  CAUSES   Fatty deposits that build up in an artery or vein but have not yet restricted blood flow entirely.  Differences in some people's anatomy.  Rapid weight loss.  Weakened areas in blood vessel walls (aneurysms).  Swelling and inflammation of blood vessels (such as from fibromuscular dysplasia and arteritis).  Disorders of blood clotting.  Scarring and  fibrosis of blood vessels after radiation therapy.  Blood vessel problems after drug use, such as use of cocaine. RISK FACTORS  Being male.  Being over age 24 with a history of coronary or vascular disease.  Smoking.  Congestive heart failure.  Diabetes.  High cholesterol.  High blood pressure (hypertension). SIGNS AND SYMPTOMS   Severe stomachache. Some people become fearful of eating because of pain.   Abdominal pain or cramps that develop about 30 minutes after a meal.   Abdominal pain after eating that becomes worse over time.   Diarrhea.   Nausea.   Vomiting.   Bloating.   Weight loss. DIAGNOSIS  Chronic mesenteric ischemia is often diagnosed after the person's history is taken, a physical exam is done, and tests are taken. Tests may include:  Ultrasounds.  CT scans.  Angiography. This is an imaging test that uses a dye to obtain a picture of blood flow to the intestine.  Endoscopy. This involves putting a scope through the mouth, down the throat, and into the stomach and intestine to view the intestinal wall and take small tissue samples (biopsies).  Tonometry. In this test a tiny probe is passed through the mouth and into the stomach or intestine and left in place for 24 hours or more. It measures the output of carbon dioxide by the affected tissues. TREATMENT  Treatment may include:   Medicines to reduce blood clotting and increase blood flow.   Surgery to remove the blockage, repair arteries or veins, and restore blood flow. This may involve:   Angioplasty. This is surgery to widen the affected artery, reduce the blockage, and sometimes insert a small, mesh tube (stent).   Bypass surgery. This may be performed to bypass the blockage and reconnect healthy arteries or veins.   A stent in the affected area to help keep blocked arteries open. HOME CARE INSTRUCTIONS  Only take over-the-counter or prescription medicines as directed by your  health care provider.   Keep all follow-up appointments as directed by your health care provider.   Prevent the condition from occurring by:  Doing regular exercise.  Keeping a healthy weight.  Keeping a healthy diet.  Managing cholesterol levels.  Keeping blood pressure and heart rhythm problems under control.  Not smoking. SEEK IMMEDIATE MEDICAL CARE IF:  You have severe abdominal pain.   You notice blood in your stool.   You have nausea, vomiting, or diarrhea.   You have a fever. MAKE SURE YOU:  Understand these instructions.  Will watch your condition.  Will get help right away if you are not doing well or get worse. Document Released: 08/07/2010 Document Revised: 08/20/2012 Document Reviewed: 06/18/2012 [facility] Patient Information 2015 [place], [place]. This information is not intended to replace advice given to you by your health care provider. Make sure you discuss any questions you have with your health care provider.

## 2013-09-29 NOTE — Progress Notes (Signed)
Established Carotid Patient   History of Present Illness  Thomas Snow is a 79 y.o. male patient of Dr. Kellie Simmering had a right carotid endarterectomy performed in September of 2013. He returns today for follow up.  He had suffered an episode of aphasia. Since that time he has done well with no new neurologic symptoms such as lateralizing weakness, aphasia, amaurosis fugax, diplopia, blurred vision, or syncope. He denies arm claudication symptoms. He has previously had a left subclavian stent inserted in 2001 with PTA of this in 2005. He currently takes Pradaxa-75 mg twice a day.  Over a year ago he injured his right shoulder, this was evaluated, and he states he was told he needs surgery "on the nerves of my right shoulder".  He denies claudication symptoms, denies non-healing wounds, he describes arthritis pain in legs.  The patient denies amaurosis fugax or monocular blindness. The patient denies facial drooping.  Pt. denies hemiplegia.  He has lost about 25 pounds in the last couple of years. He denies post prandial abdominal pain. Pt Diabetic: No  Pt smoker: former smoker, quit 18 years ago.  Rarely dips oral fine tobacco; advised to quit.   Pt meds include:  Statin : No: caused severe myalgias, was stopped  ASA: Yes  Other anticoagulants/antiplatelets: Pradaxa, for CAD   Past Medical History  Diagnosis Date  . Hyperlipidemia   . Hypertension   . Pneumonia     as a child  . Pacemaker   . Arthritis   . Coronary artery disease     prox and mid RCA atherectomy '01  . Carotid artery occlusion     left SCA stent '01, carotid occlusive dz    Social History History  Substance Use Topics  . Smoking status: Former Smoker -- 0.50 packs/day for 25 years    Types: Cigarettes    Quit date: 08/19/1996  . Smokeless tobacco: Current User    Types: Snuff  . Alcohol Use: No    Family History Family History  Problem Relation Age of Onset  . Arthritis Father     Surgical  History Past Surgical History  Procedure Laterality Date  . Kidney stone surgery    . Cardiac catheterization    . Coronary angioplasty    . Kidney stent      left RAS '05  . Tonsillectomy    . Back surgery      approx 20 years ago  . Eye surgery      bilateral cataract removal  . Knee surgery      right knee  . Cardiac pacemaker placement      [place] '85, '95; [place] '06  . Endarterectomy  09/05/2011    Procedure: ENDARTERECTOMY CAROTID;  Surgeon: Mal Misty, MD;  Location: [place];  Service: Vascular;  Laterality: Right;  . Carotid endarterectomy Right 09-05-11    cea  . Peripheral vascular angiogram  01/22/2003    Left subclavian 85% fairly focal in-stent restenosis, dilated with a 7x2cm Powerflex balloon at 10-30 and 10-30, resulting in less than 10% residual narrowing. Left renal artery demonstrated 80-85% initially dilated with a 4x15 mm coronary cutting balloon at 10-30 and 12-30, exchanged for a 69mmx2cm Aviator balloon inflations were done at 6-15, 10-30, and 10-30, resulting less than 10% residual.  . Peripheral vascular angiogram  07/13/1999    Left subclavian 85% dilated with a 40mmx2cm Cordis Power-Flex balloon at 6-40, a P-204 iliac stent was hand-crimped on a 2mmx2cm Cordis Power-Flex balloon,  expanded at 8-40 with reduction to 0%. Left Renal artery 75-80% stenosis, dilated with a 45mmx1.5cm Cordis Power-Flex balloon at 6-40, reduction of stenosis from 80% to 0%.  . Renal doppler  09/25/2012    Celiac artery and SMA-demonstrates narrowing with increased velocities consistent with greater than 50%, right renal- 1-59% diameter reduction, left renal artery stent- 1-59% diameter reduction.  . Cardiac catheterization  03/09/1999    RCA high-grade 95% stenosis, inflated with a 3.25 IVT cutting balloon at 6-64, 6-41, 6-52, and 6-53, resulting in reductiong of less than 10%. RCA proximal 75% stenosis inflated with a 2.75x10 IVT cutting balloon at 6-65 then exchanged for a 3x10 IVT  cutting balloon inflated at 6-83.  . Cardiovascular stress test  08/09/2010    Normal pattern of perfusion in all regions. No ECG changes. EKG negative for ischemia.  . Transthoracic echocardiogram  09-25-2012    EF 55-60%, mild-moderate tricuspid valve regurg  . Icd generator change  10/20/2004    Implantation of [facility] [place], model # 5357M/s, serial # [id number]    Allergies  Allergen Reactions  . Atorvastatin     Leg pain  . Crestor [Rosuvastatin]     Leg pain    Current Outpatient Prescriptions  Medication Sig Dispense Refill  . amLODipine (NORVASC) 2.5 MG tablet Take 2.5 mg by mouth daily. Patient taking it occas.      Marland Kitchen aspirin 81 MG tablet Take 81 mg by mouth daily.      . dabigatran (PRADAXA) 75 MG CAPS capsule Take 1 capsule (75 mg total) by mouth 2 (two) times daily.  60 capsule  2  . fish oil-omega-3 fatty acids 1000 MG capsule Take 1 g by mouth daily.      . pravastatin (PRAVACHOL) 20 MG tablet Take 1 tablet (20 mg total) by mouth every evening.  30 tablet  6  . ramipril (ALTACE) 10 MG capsule Take 1 capsule (10 mg total) by mouth daily.  90 capsule  3   No current facility-administered medications for this visit.    Review of Systems : See HPI for pertinent positives and negatives.  Physical Examination   Filed Vitals:   09/29/13 1210 09/29/13 1213  BP: 103/52 130/58  Pulse: 60 60  Resp:  16  Height:  5\' 5"  (1.651 m)  Weight:  110 lb (49.896 kg)  SpO2:  100%   Body mass index is 18.31 kg/(m^2).   General: WDWN male in NAD  GAIT: normal  Eyes: PERRLA  Pulmonary: CTAB, Negative Rales, Negative rhonchi, & Negative wheezing.  Cardiac: regular Rhythm , Negative detected Murmurs, pacemaker subcutaneous below right clavicle.  VASCULAR EXAM  Carotid Bruits  Left  Right    Positive  Positive   Aorta is not palpable.  Radial pulses are 1+ palpable and equal.  LE Pulses  LEFT  RIGHT   POPLITEAL  not palpable  not palpable   POSTERIOR TIBIAL   palpable  palpable   DORSALIS PEDIS  ANTERIOR TIBIAL  not palpable  not palpable   Gastrointestinal: soft, nontender, BS WNL, no r/g, negative masses.  Musculoskeletal: Negative muscle atrophy/wasting. M/S 5/5 throughout, Extremities without ischemic changes.  Pruritic rash medial aspect right lower leg/ankle.  Neurologic: A&O X 3; Appropriate Affect ; SENSATION ;normal;  Speech is normal  CN 2-12 intact, Pain and light touch intact in extremities, Motor exam as listed above.    Non-Invasive Vascular Imaging CAROTID DUPLEX 09/29/2013   CEREBROVASCULAR DUPLEX EVALUATION    INDICATION:  Carotid artery disease    PREVIOUS INTERVENTION(S): Right carotid endarterectomy 09/2011 Left subclavian stent in 2001 with angioplasty in 2005    DUPLEX EXAM: Carotid duplex    RIGHT  LEFT  Peak Systolic Velocities (cm/s) End Diastolic Velocities (cm/s) Plaque LOCATION Peak Systolic Velocities (cm/s) End Diastolic Velocities (cm/s) Plaque  146 19 HM CCA PROXIMAL 110 19 HT  173 27 HT,IP CCA MID 141 22 HT  163 31 HT,IP CCA DISTAL 178 22 HT  166 2  ECA 240 12 HT  154 19 HT,IP ICA PROXIMAL 131 31 HT  123 24 HT,IP ICA MID 124 22 HT  106 22  ICA DISTAL 135 28     0.89 ICA / CCA Ratio (PSV) 0.45  Antegrade Vertebral Flow Antegrade  [id number] Brachial Systolic Pressure (mmHg) [id number]  Triphasic Brachial Artery Waveforms Triphasic    Plaque Morphology:  HM = Homogeneous, HT = Heterogeneous, CP = Calcific Plaque, SP = Smooth Plaque, IP = Irregular Plaque     ADDITIONAL FINDINGS: Right subclavian artery velocity 280 cm/sec Left subclavian artery velocity 164 cm/sec. Left subclavian artery stent not visualized due to anatomy.    IMPRESSION: 1. Widely patent right carotid endarterectomy  2. Left internal carotid artery velocities suggest < 40% stenosis. 3. Bilateral common carotid artery plaque disease without focal stenosis    Compared to the previous exam:  No significant change from exam of 3/14 and 12/14.        Assessment: Thomas Snow is a 79 y.o. male who is s/p Right carotid endarterectomy 09/2011, left subclavian stent in 2001 with angioplasty in 2005 He presents with asymptomatic  Widely patent right carotid endarterectomy, left internal carotid artery velocities suggest < 40% stenosis. Bilateral common carotid artery plaque disease without focal stenosis No significant change from exam of 3/14 and 12/14.   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 If weight loss continues, pt and wife instructed to call our office and will check mesenteric artery Duplex sooner.  Plan: Follow-up in 1 year with Carotid Duplex scan and mesenteric artery Duplex for 25 pounds weight loss in the last 2 years.   I discussed in depth with the patient the nature of atherosclerosis, and emphasized the importance of maximal medical management including strict control of blood pressure, blood glucose, and lipid levels, obtaining regular exercise, and cessation of oral tobacco use.  The patient is aware that without maximal medical management the underlying atherosclerotic disease process will progress, limiting the benefit of any interventions. The patient was given information about stroke prevention and what symptoms should prompt the patient to seek immediate medical care. Thank you for allowing [place] to participate in this patient's care.  Clemon Chambers, RN, MSN, FNP-C [facility] Office: [phone]  Clinic Physician:   09/29/2013 12:18 PM

## 2013-10-02 ENCOUNTER — Other Ambulatory Visit: Payer: Self-pay

## 2013-10-02 MED ORDER — PRAVASTATIN SODIUM 20 MG PO TABS: 20.0000 mg | ORAL_TABLET | Freq: Every evening | ORAL | Status: DC

## 2013-10-02 MED ORDER — RAMIPRIL 10 MG PO CAPS: 10.0000 mg | ORAL_CAPSULE | Freq: Every day | ORAL | Status: DC

## 2013-10-02 NOTE — Telephone Encounter (Signed)
Rx was sent to pharmacy electronically. 

## 2013-10-12 ENCOUNTER — Ambulatory Visit (INDEPENDENT_AMBULATORY_CARE_PROVIDER_SITE_OTHER): Payer: Commercial Managed Care - HMO | Admitting: Cardiovascular Disease

## 2013-10-12 ENCOUNTER — Encounter: Payer: Self-pay | Admitting: Cardiovascular Disease

## 2013-10-12 ENCOUNTER — Other Ambulatory Visit: Payer: Self-pay

## 2013-10-12 VITALS — BP 125/64 | HR 72 | Resp 16 | Ht 65.0 in | Wt 109.9 lb

## 2013-10-12 DIAGNOSIS — I739 Peripheral vascular disease, unspecified: Secondary | ICD-10-CM

## 2013-10-12 DIAGNOSIS — I455 Other specified heart block: Secondary | ICD-10-CM

## 2013-10-12 DIAGNOSIS — I701 Atherosclerosis of renal artery: Secondary | ICD-10-CM

## 2013-10-12 DIAGNOSIS — E785 Hyperlipidemia, unspecified: Secondary | ICD-10-CM

## 2013-10-12 DIAGNOSIS — K551 Chronic vascular disorders of intestine: Secondary | ICD-10-CM

## 2013-10-12 DIAGNOSIS — I4891 Unspecified atrial fibrillation: Secondary | ICD-10-CM

## 2013-10-12 DIAGNOSIS — E782 Mixed hyperlipidemia: Secondary | ICD-10-CM

## 2013-10-12 DIAGNOSIS — I2510 Atherosclerotic heart disease of native coronary artery without angina pectoris: Secondary | ICD-10-CM

## 2013-10-12 DIAGNOSIS — Z79899 Other long term (current) drug therapy: Secondary | ICD-10-CM

## 2013-10-12 DIAGNOSIS — I6523 Occlusion and stenosis of bilateral carotid arteries: Secondary | ICD-10-CM

## 2013-10-12 DIAGNOSIS — I771 Stricture of artery: Secondary | ICD-10-CM

## 2013-10-12 DIAGNOSIS — I480 Paroxysmal atrial fibrillation: Secondary | ICD-10-CM

## 2013-10-12 MED ORDER — RIVAROXABAN 20 MG PO TABS: 20.0000 mg | ORAL_TABLET | Freq: Every day | ORAL | Status: DC

## 2013-10-12 NOTE — Patient Instructions (Addendum)
Your physician has recommended you make the following change in your medication:                                                                                                                                                       STOP THE [place].          START XARELTO 20MG  DAILY.                                                                                                                                                                                                                                                                   Increase your food intake.  Your physician recommends that you return for lab work in: Fasting lab work at [facility].  Dr. Sallyanne Kuster recommends that you schedule a follow-up appointment in: 6 months with device check.

## 2013-10-12 NOTE — Progress Notes (Signed)
Patient ID: Thomas Snow, male   DOB: Dec 12, 1934, 79 y.o.   MRN: [id number]      Reason for office visit Pacemaker followup  Thomas Snow has no complaints today. His oral intake remains poor and he has poor appetite and dysgeusia. He is trying to eat protein shakes or [employer]. He has lost another couple of pounds since his appointment in April.  Irrigation of his pacemaker shows normal device function and excellent lead parameters. Battery voltage is 2.78 pulse. He only paces the atrium 4% of the time and never paces the ventricle. Estimated longevity of the battery is as long as 9 years.  His device has recorded 184 episodes of mode switch many of them with atrial rates over 300 beats per minute, likely representing atrial fibrillation (electrograms not available). The longest episode lasted for about an hour. The overall burden of mode switches less than 1%. Rate control appears to be adequate.  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 He has not had any syncope since implantation of a pacemaker in 1985. He still has the original unipolar atrial and ventricular leads, but has undergone numerous generator change outs, most recently in 2006 when he received a St. Jude dual-chamber device. His pacemaker has recorded repeated episodes of paroxysmal atrial fibrillation which is asymptomatic. He is on anticoagulation therapy.   He has subsequently developed numerous vascular problems involving multiple arterial territories. He had Cutting Balloon angioplasty for a right coronary artery stenosis in 2001, without stent placement. Ischemia was detected by nuclear testing and he never had angina or dyspnea. He remains asymptomatic and had a low-risk nuclear study in 2012. He has preserved left ventricular systolic function. He received a stent to the left subclavian artery for subclavian steal syndrome in 2001. He is now asymptomatic from the  standpoint and his most recent ultrasound shows moderate bilateral subclavian artery stenosis. He received a stent to the left renal artery in 2005. His most recent ultrasound shows mild bilateral renal artery stenosis, less than 60%. He had a transient ischemic attack in 2013 and underwent a right carotid endarterectomy. 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 I'm not sure that there is a clear-cut cause or relationship. He does not have symptoms of intestinal angina or malabsorption.     Allergies  Allergen Reactions  . Atorvastatin     Leg pain  . Crestor [Rosuvastatin]     Leg pain    Current Outpatient Prescriptions  Medication Sig Dispense Refill  . fish oil-omega-3 fatty acids 1000 MG capsule Take 1 g by mouth daily.      . pravastatin (PRAVACHOL) 20 MG tablet Take 1 tablet (20 mg total) by mouth every evening.  90 tablet  1  . ramipril (ALTACE) 10 MG capsule Take 1 capsule (10 mg total) by mouth daily.  90 capsule  1  . triamcinolone cream (KENALOG) 0.1 %       . rivaroxaban (XARELTO) 20 MG TABS tablet Take 1 tablet (20 mg total) by mouth daily with supper.  30 tablet  5   No current facility-administered medications for this visit.    Past Medical History  Diagnosis Date  . Hyperlipidemia   . Hypertension   . Pneumonia     as a child  . Pacemaker   . Arthritis   . Coronary artery disease  prox and mid RCA atherectomy '01  . Carotid artery occlusion     left SCA stent '01, carotid occlusive dz    Past Surgical History  Procedure Laterality Date  . Kidney stone surgery    . Cardiac catheterization    . Coronary angioplasty    . Kidney stent      left RAS '05  . Tonsillectomy    . Back surgery      approx 20 years ago  . Eye surgery      bilateral cataract removal  . Knee surgery      right knee  . Cardiac pacemaker placement      [place]  '85, '95; [place] '06  . Endarterectomy  09/05/2011    Procedure: ENDARTERECTOMY CAROTID;  Surgeon: Mal Misty, MD;  Location: [facility];  Service: Vascular;  Laterality: Right;  . Carotid endarterectomy Right 09-05-11    cea  . Peripheral vascular angiogram  01/22/2003    Left subclavian 85% fairly focal in-stent restenosis, dilated with a 7x2cm Powerflex balloon at 10-30 and 10-30, resulting in less than 10% residual narrowing. Left renal artery demonstrated 80-85% initially dilated with a 4x15 mm coronary cutting balloon at 10-30 and 12-30, exchanged for a 42mmx2cm Aviator balloon inflations were done at 6-15, 10-30, and 10-30, resulting less than 10% residual.  . Peripheral vascular angiogram  07/13/1999    Left subclavian 85% dilated with a 49mmx2cm Cordis Power-Flex balloon at 6-40, a P-204 iliac stent was hand-crimped on a 57mmx2cm Cordis Power-Flex balloon, expanded at 8-40 with reduction to 0%. Left Renal artery 75-80% stenosis, dilated with a 51mmx1.5cm Cordis Power-Flex balloon at 6-40, reduction of stenosis from 80% to 0%.  . Renal doppler  09/25/2012    Celiac artery and SMA-demonstrates narrowing with increased velocities consistent with greater than 50%, right renal- 1-59% diameter reduction, left renal artery stent- 1-59% diameter reduction.  . Cardiac catheterization  03/09/1999    RCA high-grade 95% stenosis, inflated with a 3.25 IVT cutting balloon at 6-64, 6-41, 6-52, and 6-53, resulting in reductiong of less than 10%. RCA proximal 75% stenosis inflated with a 2.75x10 IVT cutting balloon at 6-65 then exchanged for a 3x10 IVT cutting balloon inflated at 6-83.  . Cardiovascular stress test  08/09/2010    Normal pattern of perfusion in all regions. No ECG changes. EKG negative for ischemia.  . Transthoracic echocardiogram  09-25-2012    EF 55-60%, mild-moderate tricuspid valve regurg  . Icd generator change  10/20/2004    Implantation of [facility] [place], model # 5357M/s,  serial # [id number]    Family History  Problem Relation Age of Onset  . Arthritis Father     History   Social History  . Marital Status: Married    Spouse Name: N/A    Number of Children: N/A  . Years of Education: N/A   Occupational History  . Not on file.   Social History Main Topics  . Smoking status: Former Smoker -- 0.50 packs/day for 25 years    Types: Cigarettes    Quit date: 08/19/1996  . Smokeless tobacco: Current User    Types: Snuff  . Alcohol Use: No  . Drug Use: No  . Sexual Activity: Not on file   Other Topics Concern  . Not on file   Social History Narrative  . No narrative on file    Review of systems: Patient ID: Thomas Snow, male DOB: 1934-12-01, 79 y.o. MRN: [id number]  Reason for office visit  CAD, PAD, pacemaker  This is my first encounter with Thomas Snow who has been a patient of Dr. Terance Ice for almost 40 years.  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 He has not had any syncope since implantation of a pacemaker in 1985. He still has the original unipolar atrial and ventricular leads are implanted at that time but has undergone numerous generator change outs, most recently in 2006 when he received a St. Jude dual-chamber device. His pacemaker has recorded repeated episodes of paroxysmal atrial fibrillation which is asymptomatic. He is on anticoagulation therapy.  He has subsequently developed numerous vascular problems involving multiple arterial territories. He had Cutting Balloon angioplasty for a right coronary artery stenosis in 2001, without stent placement. Ischemia was detected by nuclear testing and he never had angina or dyspnea. He remains asymptomatic and had a low-risk nuclear study in 2012. He has preserved left ventricular systolic function. 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 He is now asymptomatic  from the standpoint and his most recent ultrasound shows moderate bilateral subclavian artery stenosis. He received a stent to the left renal artery in 2005. His most recent ultrasound shows mild bilateral renal artery stenosis, less than 60%. He had a transient ischemic attack in 2013 and underwent a right carotid endarterectomy.  He has only one complaint and that is his inability to gain weight. He lost about 20 pounds over a brief period of time which she attributes to taking vinegar and soda to help heal an oral ulcer. His appetite is poor and his oral intake is low. She denies any abdominal pain with eating or any other symptoms that might suggest mesenteric angina. He simply has early satiety. He is frankly underweight with a BMI around 18. 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 I'm not sure that there is a clear-cut cause or relationship.  Allergies   Allergen  Reactions   .  Atorvastatin      Leg pain   .  Crestor [Rosuvastatin]      Leg pain    Current Outpatient Prescriptions   Medication  Sig  Dispense  Refill   .  amLODipine (NORVASC) 2.5 MG tablet  Take 2.5 mg by mouth daily. Patient taking it occas.     Marland Kitchen  aspirin 81 MG tablet  Take 81 mg by mouth daily.     .  dabigatran (PRADAXA) 75 MG CAPS capsule  Take 1 capsule (75 mg total) by mouth every 12 (twelve) hours. Appointment needed for refills.  60 capsule  0   .  fish oil-omega-3 fatty acids 1000 MG capsule  Take 1 g by mouth daily.     .  ramipril (ALTACE) 10 MG capsule  TAKE ONE CAPSULE BY MOUTH ONCE DAILY APPOINTMENT NEEDED FOR REFILLS  10 capsule  0   .  pravastatin (PRAVACHOL) 20 MG tablet  Take 1 tablet (20 mg total) by mouth every evening.  30 tablet  6    No current facility-administered medications for this visit.    Past Medical History   Diagnosis  Date   .  Hyperlipidemia    .  Hypertension    .   Pneumonia      as a child   .  Pacemaker    .  Arthritis    .  Coronary artery disease      prox and mid RCA atherectomy '01   .  Carotid artery occlusion      left SCA stent '01, carotid occlusive dz    Past Surgical History   Procedure  Laterality  Date   .  Kidney stone surgery     .  Cardiac catheterization     .  Coronary angioplasty     .  Kidney stent       left RAS '05   .  Tonsillectomy     .  Back surgery       approx 20 years ago   .  Eye surgery       bilateral cataract removal   .  Knee surgery       right knee   .  Cardiac pacemaker placement       [place] '85, '95; [place] '06   .  Endarterectomy   09/05/2011     Procedure: ENDARTERECTOMY CAROTID; Surgeon: Mal Misty, MD; Location: [facility]; Service: Vascular; Laterality: Right;   .  Carotid endarterectomy  Right  09-05-11     cea   .  Peripheral vascular angiogram   01/22/2003     Left subclavian 85% fairly focal in-stent restenosis, dilated with a 7x2cm Powerflex balloon at 10-30 and 10-30, resulting in less than 10% residual narrowing. Left renal artery demonstrated 80-85% initially dilated with a 4x15 mm coronary cutting balloon at 10-30 and 12-30, exchanged for a 54mmx2cm Aviator balloon inflations were done at 6-15, 10-30, and 10-30, resulting less than 10% residual.   .  Peripheral vascular angiogram   07/13/1999     Left subclavian 85% dilated with a 36mmx2cm Cordis Power-Flex balloon at 6-40, a P-204 iliac stent was hand-crimped on a 37mmx2cm Cordis Power-Flex balloon, expanded at 8-40 with reduction to 0%. Left Renal artery 75-80% stenosis, dilated with a 63mmx1.5cm Cordis Power-Flex balloon at 6-40, reduction of stenosis from 80% to 0%.   .  Renal doppler   09/25/2012     Celiac artery and SMA-demonstrates narrowing with increased velocities consistent with greater than 50%, right renal- 1-59% diameter reduction, left renal artery stent- 1-59% diameter reduction.   .  Cardiac catheterization   03/09/1999      RCA high-grade 95% stenosis, inflated with a 3.25 IVT cutting balloon at 6-64, 6-41, 6-52, and 6-53, resulting in reductiong of less than 10%. RCA proximal 75% stenosis inflated with a 2.75x10 IVT cutting balloon at 6-65 then exchanged for a 3x10 IVT cutting balloon inflated at 6-83.   .  Cardiovascular stress test   08/09/2010     Normal pattern of perfusion in all regions. No ECG changes. EKG negative for ischemia.   .  Transthoracic echocardiogram   09-25-2012     EF 55-60%, mild-moderate tricuspid valve regurg   .  Icd generator change   10/20/2004     Implantation of [facility] [place], model # 5357M/s, serial # [id number]    Family History   Problem  Relation  Age of Onset   .  Arthritis  Father     History    Social History   .  Marital Status:  Married     Spouse Name:  N/A     Number of Children:  N/A   .  Years of Education:  N/A    Occupational History   .  Not on file.  Social History Main Topics   .  Smoking status:  Former Smoker -- 0.50 packs/day for 25 years     Types:  Cigarettes     Quit date:  08/19/1996   .  Smokeless tobacco:  Current User   .  Alcohol Use:  No   .  Drug Use:  No   .  Sexual Activity:  Not on file    Other Topics  Concern   .  Not on file    Social History Narrative   .  No narrative on file   Review of systems:  Inability to gain weight, poor appetite. Bilateral knee arthralgia limits him more than any other symptoms  The patient specifically denies any chest pain at rest or with exertion, dyspnea at rest or with exertion, orthopnea, paroxysmal nocturnal dyspnea, syncope, palpitations, focal neurological deficits, intermittent claudication, lower extremity edema, unexplained weight gain, cough, hemoptysis or wheezing.  The patient also denies abdominal pain, nausea, vomiting, dysphagia, diarrhea, constipation, polyuria, polydipsia, dysuria, hematuria, frequency, urgency, abnormal bleeding or bruising, fever, chills, unexpected  weight changes, mood swings, change in skin or hair texture, change in voice quality, auditory or visual problems, allergic reactions or rashes, new musculoskeletal complaints other than usual "aches and pains".    PHYSICAL EXAM BP 125/64  Pulse 72  Resp 16  Ht 5\' 5"  (1.651 m)  Wt 49.85 kg (109 lb 14.4 oz)  BMI 18.29 kg/m2 Equal blood pressure in the right and left upper extremities  General: Alert, oriented x3, no distress. Underweight  Head: no evidence of trauma, PERRL, EOMI, no exophtalmos or lid lag, no myxedema, no xanthelasma; normal ears, nose and oropharynx  Neck: normal jugular venous pulsations and no hepatojugular reflux; brisk carotid pulses without delay; scar of right carotid endarterectomy; loud left carotid bruit  Chest: clear to auscultation, no signs of consolidation by percussion or palpation, normal fremitus, symmetrical and full respiratory excursions. The right subclavian pacemaker site is well-healed. There are very obvious large tortuous venous collaterals overlying the right shoulder and right upper chest, including right over his pacemaker.  Cardiovascular: normal position and quality of the apical impulse, regular rhythm, normal first and second heart sounds, no murmurs, rubs or gallops  Abdomen: no tenderness or distention, no masses by palpation, no abnormal pulsatility ; there are loud systolic bruits heard immediately above the umbilicus and in the left flank;, normal bowel sounds, no hepatosplenomegaly  Extremities: no clubbing, cyanosis or edema; 2+ radial, ulnar and brachial pulses bilaterally; 2+ right femoral, posterior tibial and dorsalis pedis pulses; 2+ left femoral, posterior tibial and dorsalis pedis pulses; he has loud bilateral subclavian and bilateral femoral bruits  Neurological: grossly nonfocal   EKG: Normal sinus rhythm  Lipid Panel     Component Value Date/Time   CHOL 168 06/18/2012 1043   TRIG 183* 06/18/2012 1043   HDL 39* 06/18/2012 1043    CHOLHDL 4.3 06/18/2012 1043   VLDL 37 06/18/2012 1043   LDLCALC 92 06/18/2012 1043    BMET    Component Value Date/Time   NA 138 09/06/2011 0438   K 4.1 09/06/2011 0438   CL 102 09/06/2011 0438   CO2 26 09/06/2011 0438   GLUCOSE 202* 09/06/2011 0438   BUN 16 09/06/2011 0438   CREATININE 1.33 09/06/2011 0438   CALCIUM 8.5 09/06/2011 0438   GFRNONAA 51* 09/06/2011 0438   GFRAA 59* 09/06/2011 0438     ASSESSMENT AND PLAN Subclavian arterial stenosis, bilateral  He received a stent  to the left subclavian artery in 2001. By the most recent ultrasound performed in 2013 there were bilateral 50-70% lesions. No subclavian steal syndrome or upper extremity claudication.  Left renal artery stent 2005  Roughly 10 years status post placement of his stent. He has mildly abnormal renal function and has excellent blood pressure. Most recent duplex ultrasound was performed in September of 2014 and did not show significant stenoses, although there was slight increase in bilateral renal artery velocities (renal-aortic ratio roughly 2.2:1).  CAD s/p RCA angioplasty 2001  No symptoms of coronary disease. Remote Cutting Balloon angioplasty without stent placement to the right coronary 2001. Normal perfusion by nuclear study in 2012. Normal left ventricle systolic function. No symptoms of heart failure.  Underweight  He has a poor appetite which explains his weight loss. 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 He does not describe intestinal angina but one wonders whether this could be the cause of his weight loss. Referral to Dr. Gwenlyn Found. Will at least ask him for an opinion. Possibly could benefit from SMA stent.  Pacemaker - dual chamber, implanted in 1985, last generator change 2006, St. 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 He has had  the same pacemaker leads in place since 1985. He has slightly elevated pacing thresholds on the right ventricular lead but paces the ventricle very rarely. No reprogramming changes performed today.  His physical exam suggests collateral venous circulation due to suspected occlusion of the right subclavian vein. If lead replacement is necessary he will require an entirely new system on the opposite side. For now device function is normal. Unfortunately his pacemaker is not amenable to remote monitoring. We'll see him in the clinic every 6 months.  Hyperlipidemia  Acceptable lipid levels on statin therapy. Especially in view of his dramatic weight loss I do not think we should add a second agent for his mild hypertriglyceridemia. Recheck lipid profile. May even consider stopping his statin if his lipid parameters are low  Paroxysmal atrial fibrillation  Episodes of atrial fibrillation are asymptomatic and relatively brief. Ventricular rate control is acceptable during atrial fibrillation. Continue anticoagulation. I think it's a long shot, but it's possible side effects of dabigatran are contributing to his weight loss. Will try switching to another agent: Xarelto 20 mg daily  Orders Placed This Encounter  Procedures  . Comprehensive metabolic panel  . Lipid panel  . EKG 12-Lead   Meds ordered this encounter  Medications  . triamcinolone cream (KENALOG) 0.1 %    Sig:   . rivaroxaban (XARELTO) 20 MG TABS tablet    Sig: Take 1 tablet (20 mg total) by mouth daily with supper.    Dispense:  30 tablet    Refill:  [place] Juni Glaab, MD, [facility] [phone] office [phone] pager

## 2013-10-14 ENCOUNTER — Encounter: Payer: Self-pay | Admitting: Cardiovascular Disease

## 2013-10-16 LAB — MDC_IDC_ENUM_SESS_TYPE_INCLINIC
Brady Statistic RA Percent Paced: 4.1 %
Lead Channel Impedance Value: 425 Ohm
Lead Channel Pacing Threshold Pulse Width: 0.6 ms
Lead Channel Setting Pacing Amplitude: 2.75 V
Lead Channel Setting Pacing Amplitude: 3 V
Lead Channel Setting Sensing Sensitivity: 3 mV
MDC IDC MSMT BATTERY VOLTAGE: 2.78 V
MDC IDC MSMT LEADCHNL RA PACING THRESHOLD AMPLITUDE: 1.25 V
MDC IDC MSMT LEADCHNL RA PACING THRESHOLD PULSEWIDTH: 0.6 ms
MDC IDC MSMT LEADCHNL RA SENSING INTR AMPL: 1.2 mV
MDC IDC MSMT LEADCHNL RV IMPEDANCE VALUE: 471 Ohm
MDC IDC MSMT LEADCHNL RV PACING THRESHOLD AMPLITUDE: 1.25 V
MDC IDC MSMT LEADCHNL RV SENSING INTR AMPL: 4.9 mV
MDC IDC PG SERIAL: 1598850
MDC IDC SET LEADCHNL RV PACING PULSEWIDTH: 0.6 ms
MDC IDC STAT BRADY RV PERCENT PACED: 1 % — AB

## 2013-11-12 ENCOUNTER — Telehealth: Payer: Self-pay | Admitting: Cardiovascular Disease

## 2013-11-12 NOTE — Telephone Encounter (Signed)
Dr C changed his medicine from Pradaxa and put him on Xarelto on 10-12-13.. The medicine was never called in. Would you please call this to [facility]-[phone]

## 2013-11-12 NOTE — Telephone Encounter (Signed)
Rx was sent into mail order pharmacy in October, Wife states that she has ignored a few calls from [facility] because she thought they were "just going to be about insurance". Wife stated she will call them and let them know she is ready for the medication and if there are any additional questions she will call back.

## 2013-11-23 ENCOUNTER — Telehealth: Payer: Self-pay | Admitting: Cardiovascular Disease

## 2013-11-23 MED ORDER — RIVAROXABAN 20 MG PO TABS: 20.0000 mg | ORAL_TABLET | Freq: Every day | ORAL | Status: DC

## 2013-11-23 NOTE — Telephone Encounter (Signed)
Rx was sent to pharmacy electronically. 

## 2013-11-23 NOTE — Telephone Encounter (Signed)
Pt's wife called in stating that his Xarelto was sent to the wrong pharmacy. She would like it sent to the [facility] in [place]. Please call  Thanks

## 2014-02-06 ENCOUNTER — Emergency Department (HOSPITAL_COMMUNITY): Payer: PPO

## 2014-02-06 ENCOUNTER — Encounter (HOSPITAL_COMMUNITY): Payer: Self-pay | Admitting: Physical Medicine and Rehabilitation

## 2014-02-06 ENCOUNTER — Inpatient Hospital Stay (HOSPITAL_COMMUNITY)
Admission: EM | Admit: 2014-02-06 | Discharge: 2014-02-08 | DRG: 871 | Disposition: A | Discharge status: Home / Self Care | Payer: PPO | Attending: Internal Medicine | Admitting: Internal Medicine

## 2014-02-06 DIAGNOSIS — R059 Cough, unspecified: Secondary | ICD-10-CM

## 2014-02-06 DIAGNOSIS — R112 Nausea with vomiting, unspecified: Secondary | ICD-10-CM | POA: Diagnosis present

## 2014-02-06 DIAGNOSIS — R7301 Impaired fasting glucose: Secondary | ICD-10-CM

## 2014-02-06 DIAGNOSIS — E46 Unspecified protein-calorie malnutrition: Secondary | ICD-10-CM | POA: Diagnosis present

## 2014-02-06 DIAGNOSIS — Z7901 Long term (current) use of anticoagulants: Secondary | ICD-10-CM | POA: Diagnosis not present

## 2014-02-06 DIAGNOSIS — E1165 Type 2 diabetes mellitus with hyperglycemia: Secondary | ICD-10-CM | POA: Diagnosis present

## 2014-02-06 DIAGNOSIS — I2510 Atherosclerotic heart disease of native coronary artery without angina pectoris: Secondary | ICD-10-CM

## 2014-02-06 DIAGNOSIS — Z950 Presence of cardiac pacemaker: Secondary | ICD-10-CM | POA: Diagnosis not present

## 2014-02-06 DIAGNOSIS — Z95820 Peripheral vascular angioplasty status with implants and grafts: Secondary | ICD-10-CM

## 2014-02-06 DIAGNOSIS — E785 Hyperlipidemia, unspecified: Secondary | ICD-10-CM | POA: Diagnosis present

## 2014-02-06 DIAGNOSIS — I10 Essential (primary) hypertension: Secondary | ICD-10-CM | POA: Diagnosis present

## 2014-02-06 DIAGNOSIS — I129 Hypertensive chronic kidney disease with stage 1 through stage 4 chronic kidney disease, or unspecified chronic kidney disease: Secondary | ICD-10-CM

## 2014-02-06 DIAGNOSIS — R636 Underweight: Secondary | ICD-10-CM

## 2014-02-06 DIAGNOSIS — Z9861 Coronary angioplasty status: Secondary | ICD-10-CM | POA: Diagnosis not present

## 2014-02-06 DIAGNOSIS — A419 Sepsis, unspecified organism: Secondary | ICD-10-CM | POA: Diagnosis present

## 2014-02-06 DIAGNOSIS — Z888 Allergy status to other drugs, medicaments and biological substances status: Secondary | ICD-10-CM

## 2014-02-06 DIAGNOSIS — I6522 Occlusion and stenosis of left carotid artery: Secondary | ICD-10-CM | POA: Diagnosis present

## 2014-02-06 DIAGNOSIS — Z87891 Personal history of nicotine dependence: Secondary | ICD-10-CM

## 2014-02-06 DIAGNOSIS — R6510 Systemic inflammatory response syndrome (SIRS) of non-infectious origin without acute organ dysfunction: Secondary | ICD-10-CM

## 2014-02-06 DIAGNOSIS — Z681 Body mass index (BMI) 19 or less, adult: Secondary | ICD-10-CM | POA: Diagnosis not present

## 2014-02-06 DIAGNOSIS — Z9889 Other specified postprocedural states: Secondary | ICD-10-CM | POA: Diagnosis not present

## 2014-02-06 DIAGNOSIS — Z79899 Other long term (current) drug therapy: Secondary | ICD-10-CM | POA: Diagnosis not present

## 2014-02-06 DIAGNOSIS — J189 Pneumonia, unspecified organism: Secondary | ICD-10-CM

## 2014-02-06 DIAGNOSIS — M1990 Unspecified osteoarthritis, unspecified site: Secondary | ICD-10-CM | POA: Diagnosis present

## 2014-02-06 DIAGNOSIS — N183 Chronic kidney disease, stage 3 (moderate): Secondary | ICD-10-CM | POA: Diagnosis present

## 2014-02-06 DIAGNOSIS — N179 Acute kidney failure, unspecified: Secondary | ICD-10-CM

## 2014-02-06 DIAGNOSIS — I480 Paroxysmal atrial fibrillation: Secondary | ICD-10-CM | POA: Diagnosis present

## 2014-02-06 DIAGNOSIS — R05 Cough: Secondary | ICD-10-CM

## 2014-02-06 LAB — URINE MICROSCOPIC-ADD ON

## 2014-02-06 LAB — COMPREHENSIVE METABOLIC PANEL
ALT: 14 U/L (ref 0–53)
AST: 36 U/L (ref 0–37)
Albumin: 3.6 g/dL (ref 3.5–5.2)
Alkaline Phosphatase: 58 U/L (ref 39–117)
Anion gap: 8 (ref 5–15)
BILIRUBIN TOTAL: 0.6 mg/dL (ref 0.3–1.2)
BUN: 24 mg/dL — ABNORMAL HIGH (ref 6–23)
CALCIUM: 9.8 mg/dL (ref 8.4–10.5)
CHLORIDE: 98 mmol/L (ref 96–112)
CO2: 31 mmol/L (ref 19–32)
Creatinine, Ser: 2.03 mg/dL — ABNORMAL HIGH (ref 0.50–1.35)
GFR, EST AFRICAN AMERICAN: 35 mL/min — AB (ref >90)
GFR, EST NON AFRICAN AMERICAN: 30 mL/min — AB (ref >90)
Glucose, Bld: 348 mg/dL — ABNORMAL HIGH (ref 70–99)
Potassium: 4.2 mmol/L (ref 3.5–5.1)
SODIUM: 137 mmol/L (ref 135–145)
Total Protein: 6.9 g/dL (ref 6.0–8.3)

## 2014-02-06 LAB — I-STAT TROPONIN, ED: Troponin i, poc: 0.02 ng/mL (ref 0.00–0.08)

## 2014-02-06 LAB — URINALYSIS, ROUTINE W REFLEX MICROSCOPIC
Ketones, ur: NEGATIVE mg/dL
Leukocytes, UA: NEGATIVE
Nitrite: NEGATIVE
PROTEIN: 100 mg/dL — AB
Specific Gravity, Urine: 1.03 (ref 1.005–1.030)
Urobilinogen, UA: 0.2 mg/dL (ref 0.0–1.0)
pH: 5.5 (ref 5.0–8.0)

## 2014-02-06 LAB — PROCALCITONIN: Procalcitonin: 2.87 ng/mL

## 2014-02-06 LAB — CBC WITH DIFFERENTIAL/PLATELET
BASOS ABS: 0 10*3/uL (ref 0.0–0.1)
BASOS PCT: 0 % (ref 0–1)
EOS PCT: 0 % (ref 0–5)
Eosinophils Absolute: 0 10*3/uL (ref 0.0–0.7)
HEMATOCRIT: 40.2 % (ref 39.0–52.0)
Hemoglobin: 13.5 g/dL (ref 13.0–17.0)
Lymphocytes Relative: 3 % — ABNORMAL LOW (ref 12–46)
Lymphs Abs: 0.7 10*3/uL (ref 0.7–4.0)
MCH: 29.8 pg (ref 26.0–34.0)
MCHC: 33.6 g/dL (ref 30.0–36.0)
MCV: 88.7 fL (ref 78.0–100.0)
MONOS PCT: 5 % (ref 3–12)
Monocytes Absolute: 0.9 10*3/uL (ref 0.1–1.0)
Neutro Abs: 18.9 10*3/uL — ABNORMAL HIGH (ref 1.7–7.7)
Neutrophils Relative %: 92 % — ABNORMAL HIGH (ref 43–77)
Platelets: 265 10*3/uL (ref 150–400)
RBC: 4.53 MIL/uL (ref 4.22–5.81)
RDW: 13.7 % (ref 11.5–15.5)
WBC: 20.5 10*3/uL — ABNORMAL HIGH (ref 4.0–10.5)

## 2014-02-06 LAB — I-STAT CG4 LACTIC ACID, ED
LACTIC ACID, VENOUS: 0.93 mmol/L (ref 0.5–2.0)
Lactic Acid, Venous: 4.36 mmol/L (ref 0.5–2.0)

## 2014-02-06 LAB — LACTIC ACID, PLASMA: Lactic Acid, Venous: 1 mmol/L (ref 0.5–2.0)

## 2014-02-06 MED ORDER — ONDANSETRON HCL 4 MG/2ML IJ SOLN: 4.0000 mg | Freq: Four times a day (QID) | INTRAMUSCULAR | Status: DC | PRN

## 2014-02-06 MED ORDER — ONDANSETRON HCL 4 MG/2ML IJ SOLN
4.0000 mg | Freq: Once | INTRAMUSCULAR | Status: AC
  Administered 2014-02-06: 4 mg via INTRAVENOUS
  Filled 2014-02-06: qty 2

## 2014-02-06 MED ORDER — PIPERACILLIN-TAZOBACTAM 3.375 G IVPB: 3.3750 g | Freq: Three times a day (TID) | INTRAVENOUS | Status: DC

## 2014-02-06 MED ORDER — AZITHROMYCIN 250 MG PO TABS
250.0000 mg | ORAL_TABLET | Freq: Every day | ORAL | Status: DC
  Administered 2014-02-07: 250 mg via ORAL
  Filled 2014-02-06: qty 1

## 2014-02-06 MED ORDER — RIVAROXABAN 20 MG PO TABS: 20.0000 mg | ORAL_TABLET | Freq: Every day | ORAL | Status: DC

## 2014-02-06 MED ORDER — AZITHROMYCIN 500 MG PO TABS: 500.0000 mg | ORAL_TABLET | Freq: Every day | ORAL | Status: DC

## 2014-02-06 MED ORDER — VANCOMYCIN HCL 500 MG IV SOLR: 500.0000 mg | INTRAVENOUS | Status: DC

## 2014-02-06 MED ORDER — ALBUTEROL SULFATE (2.5 MG/3ML) 0.083% IN NEBU: 5.0000 mg | INHALATION_SOLUTION | Freq: Once | RESPIRATORY_TRACT | Status: DC

## 2014-02-06 MED ORDER — OMEGA-3 FATTY ACIDS 1000 MG PO CAPS
1.0000 g | ORAL_CAPSULE | Freq: Every day | ORAL | Status: DC
  Administered 2014-02-07 – 2014-02-08 (×2): 1 g via ORAL
  Filled 2014-02-06 (×3): qty 1

## 2014-02-06 MED ORDER — VANCOMYCIN HCL IN DEXTROSE 1-5 GM/200ML-% IV SOLN
1000.0000 mg | Freq: Once | INTRAVENOUS | Status: AC
  Administered 2014-02-06: 1000 mg via INTRAVENOUS
  Filled 2014-02-06: qty 200

## 2014-02-06 MED ORDER — RIVAROXABAN 15 MG PO TABS
15.0000 mg | ORAL_TABLET | Freq: Every day | ORAL | Status: DC
  Administered 2014-02-06 – 2014-02-07 (×2): 15 mg via ORAL
  Filled 2014-02-06 (×3): qty 1

## 2014-02-06 MED ORDER — DEXTROSE 5 % IV SOLN
1.0000 g | INTRAVENOUS | Status: DC
  Filled 2014-02-06: qty 10

## 2014-02-06 MED ORDER — SODIUM CHLORIDE 0.9 % IJ SOLN
3.0000 mL | Freq: Two times a day (BID) | INTRAMUSCULAR | Status: DC
  Administered 2014-02-07 – 2014-02-08 (×3): 3 mL via INTRAVENOUS

## 2014-02-06 MED ORDER — ALBUTEROL SULFATE (2.5 MG/3ML) 0.083% IN NEBU: 5.0000 mg | INHALATION_SOLUTION | RESPIRATORY_TRACT | Status: DC | PRN

## 2014-02-06 MED ORDER — DM-GUAIFENESIN ER 30-600 MG PO TB12
1.0000 | ORAL_TABLET | Freq: Two times a day (BID) | ORAL | Status: DC
  Administered 2014-02-07 – 2014-02-08 (×3): 1 via ORAL
  Filled 2014-02-06 (×5): qty 1

## 2014-02-06 MED ORDER — ACETAMINOPHEN 650 MG RE SUPP: 650.0000 mg | Freq: Four times a day (QID) | RECTAL | Status: DC | PRN

## 2014-02-06 MED ORDER — BENZONATATE 100 MG PO CAPS
100.0000 mg | ORAL_CAPSULE | Freq: Two times a day (BID) | ORAL | Status: DC | PRN
  Filled 2014-02-06: qty 1

## 2014-02-06 MED ORDER — ACETAMINOPHEN 325 MG PO TABS
650.0000 mg | ORAL_TABLET | Freq: Four times a day (QID) | ORAL | Status: DC | PRN
  Administered 2014-02-06: 650 mg via ORAL
  Filled 2014-02-06: qty 2

## 2014-02-06 MED ORDER — PIPERACILLIN-TAZOBACTAM 3.375 G IVPB 30 MIN
3.3750 g | Freq: Once | INTRAVENOUS | Status: AC
  Administered 2014-02-06: 3.375 g via INTRAVENOUS
  Filled 2014-02-06: qty 50

## 2014-02-06 MED ORDER — PRAVASTATIN SODIUM 20 MG PO TABS
20.0000 mg | ORAL_TABLET | Freq: Every evening | ORAL | Status: DC
  Administered 2014-02-07: 20 mg via ORAL
  Filled 2014-02-06 (×3): qty 1

## 2014-02-06 MED ORDER — SODIUM CHLORIDE 0.9 % IV SOLN
INTRAVENOUS | Status: AC
  Administered 2014-02-06 – 2014-02-07 (×2): via INTRAVENOUS

## 2014-02-06 MED ORDER — ENSURE COMPLETE PO LIQD
237.0000 mL | Freq: Two times a day (BID) | ORAL | Status: DC
  Administered 2014-02-07 (×2): 237 mL via ORAL

## 2014-02-06 MED ORDER — SODIUM CHLORIDE 0.9 % IV BOLUS (SEPSIS)
1000.0000 mL | Freq: Once | INTRAVENOUS | Status: AC
  Administered 2014-02-06: 1000 mL via INTRAVENOUS

## 2014-02-06 MED ORDER — SODIUM CHLORIDE 0.9 % IV BOLUS (SEPSIS)
500.0000 mL | INTRAVENOUS | Status: AC
  Administered 2014-02-06: 500 mL via INTRAVENOUS

## 2014-02-06 NOTE — ED Notes (Signed)
Activated Level 2 Code Sepsis 

## 2014-02-06 NOTE — ED Notes (Signed)
PA at bedside.

## 2014-02-06 NOTE — Progress Notes (Signed)
ANTIBIOTIC CONSULT NOTE - INITIAL  Pharmacy Consult for vancomycin and zosyn Indication: rule out sepsis  Allergies  Allergen Reactions  . Atorvastatin     Leg pain  . Crestor [Rosuvastatin]     Leg pain    Patient Measurements:   Body Weight: ~50 kg  Vital Signs: Temp: 99.6 F (37.6 C) (02/06 1255) Temp Source: Oral (02/06 1255) BP: 131/51 mmHg (02/06 1245) Pulse Rate: 103 (02/06 1245) Intake/Output from previous day:   Intake/Output from this shift:    Labs:  Recent Labs  02/06/14 1140  WBC 20.5*  HGB 13.5  PLT 265  CREATININE 2.03*   CrCl cannot be calculated (Unknown ideal weight.). No results for input(s): VANCOTROUGH, VANCOPEAK, VANCORANDOM, GENTTROUGH, GENTPEAK, GENTRANDOM, TOBRATROUGH, TOBRAPEAK, TOBRARND, AMIKACINPEAK, AMIKACINTROU, AMIKACIN in the last 72 hours.   Microbiology: No results found for this or any previous visit (from the past 720 hour(s)).  Medical History: Past Medical History  Diagnosis Date  . Hyperlipidemia   . Hypertension   . Pneumonia     as a child  . Pacemaker   . Arthritis   . Coronary artery disease     prox and mid RCA atherectomy '01  . Carotid artery occlusion     left SCA stent '01, carotid occlusive dz    Medications:  See med history Assessment: 80 yo man to start broad spectrum antibiotics for sepsis.  He presented with sinus congestion and nausea.  LA 4.36. WBC 20.5, BP soft and tachycardic, T max 100.1  First doses of antibiotics have been ordered in the ED  Goal of Therapy:  Vancomycin trough level 15-20 mcg/ml  Plan:  Cont zosyn 3.375 gm IV q8 hours Cont vancomycin 500 mg IV q24 hours F/u renal function, cultures and clinical course  Thanks for allowing pharmacy to be a part of this patient's care.  Excell Seltzer, PharmD Clinical Pharmacist, [phone]  02/06/2014,1:11 PM

## 2014-02-06 NOTE — ED Provider Notes (Signed)
CSN: [id number]     Arrival date & time 02/06/14  1113 History   First MD Initiated Contact with Patient 02/06/14 1150     Chief Complaint  Patient presents with  . Nasal Congestion  . Emesis     (Consider location/radiation/quality/duration/timing/severity/associated sxs/prior Treatment) HPI Comments: Patient brought in today by wife due to confusion, sinus pain, congestion, and vomiting. Patient reports that he has had sinus pain and congestion for the past 3 days, which is gradually worsening.  He also had a productive cough.  He has taken an OTC decongestant for his congestion without improvement.His wife reports that last evening he appeared confused.  He was moving hay and using bolts during the middle of the night.  This morning he said to his wife, "Whose house is this anyway?" while at his house that he has lived in for 28 years.  She reports that she checked his temperature yesterday and it was 99 orally.  She did not check his temperature today.  He also had three episodes of vomiting yesterday, but denies nausea or vomiting today.   No abdominal pain, chest pain, or SOB.  No recent falls or head injury.  Wife reports that she has not noticed any slurred speech or facial asymmetry.  Patient reports generalized weakness, but no focal neurological deficits.  No urinary symptoms.    The history is provided by the patient.    Past Medical History  Diagnosis Date  . Hyperlipidemia   . Hypertension   . Pneumonia     as a child  . Pacemaker   . Arthritis   . Coronary artery disease     prox and mid RCA atherectomy '01  . Carotid artery occlusion     left SCA stent '01, carotid occlusive dz   Past Surgical History  Procedure Laterality Date  . Kidney stone surgery    . Cardiac catheterization    . Coronary angioplasty    . Kidney stent      left RAS '05  . Tonsillectomy    . Back surgery      approx 20 years ago  . Eye surgery      bilateral cataract removal  . Knee surgery       right knee  . Cardiac pacemaker placement      [place] '85, '95; [place] '06  . Endarterectomy  09/05/2011    Procedure: ENDARTERECTOMY CAROTID;  Surgeon: Mal Misty, MD;  Location: [place];  Service: Vascular;  Laterality: Right;  . Carotid endarterectomy Right 09-05-11    cea  . Peripheral vascular angiogram  01/22/2003    Left subclavian 85% fairly focal in-stent restenosis, dilated with a 7x2cm Powerflex balloon at 10-30 and 10-30, resulting in less than 10% residual narrowing. Left renal artery demonstrated 80-85% initially dilated with a 4x15 mm coronary cutting balloon at 10-30 and 12-30, exchanged for a 73mmx2cm Aviator balloon inflations were done at 6-15, 10-30, and 10-30, resulting less than 10% residual.  . Peripheral vascular angiogram  07/13/1999    Left subclavian 85% dilated with a 25mmx2cm Cordis Power-Flex balloon at 6-40, a P-204 iliac stent was hand-crimped on a 37mmx2cm Cordis Power-Flex balloon, expanded at 8-40 with reduction to 0%. Left Renal artery 75-80% stenosis, dilated with a 24mmx1.5cm Cordis Power-Flex balloon at 6-40, reduction of stenosis from 80% to 0%.  . Renal doppler  09/25/2012    Celiac artery and SMA-demonstrates narrowing with increased velocities consistent with greater than 50%, right renal-  1-59% diameter reduction, left renal artery stent- 1-59% diameter reduction.  . Cardiac catheterization  03/09/1999    RCA high-grade 95% stenosis, inflated with a 3.25 IVT cutting balloon at 6-64, 6-41, 6-52, and 6-53, resulting in reductiong of less than 10%. RCA proximal 75% stenosis inflated with a 2.75x10 IVT cutting balloon at 6-65 then exchanged for a 3x10 IVT cutting balloon inflated at 6-83.  . Cardiovascular stress test  08/09/2010    Normal pattern of perfusion in all regions. No ECG changes. EKG negative for ischemia.  . Transthoracic echocardiogram  09-25-2012    EF 55-60%, mild-moderate tricuspid valve regurg  . Icd generator change  10/20/2004     Implantation of [facility] [employer], model # 5357M/s, serial # [id number]   Family History  Problem Relation Age of Onset  . Arthritis Father    History  Substance Use Topics  . Smoking status: Former Smoker -- 0.50 packs/day for 25 years    Types: Cigarettes    Quit date: 08/19/1996  . Smokeless tobacco: Current User    Types: Snuff  . Alcohol Use: No    Review of Systems  All other systems reviewed and are negative.     Allergies  Atorvastatin and Crestor  Home Medications   Prior to Admission medications   Medication Sig Start Date End Date Taking? Authorizing Provider  fish oil-omega-3 fatty acids 1000 MG capsule Take 1 g by mouth daily.    Historical Provider, MD  pravastatin (PRAVACHOL) 20 MG tablet Take 1 tablet (20 mg total) by mouth every evening. 10/02/13   Mihai Croitoru, MD  ramipril (ALTACE) 10 MG capsule Take 1 capsule (10 mg total) by mouth daily. 10/02/13   Mihai Croitoru, MD  rivaroxaban (XARELTO) 20 MG TABS tablet Take 1 tablet (20 mg total) by mouth daily with supper. 11/23/13   Mihai Croitoru, MD  triamcinolone cream (KENALOG) 0.1 %  09/02/13   Historical Provider, MD   BP 116/86 mmHg  Pulse 96  Temp(Src) 100.1 F (37.8 C) (Oral)  Resp 21  SpO2 96% Physical Exam  Constitutional: He appears well-developed and well-nourished. No distress.  HENT:  Head: Normocephalic and atraumatic.  Mouth/Throat: Uvula is midline. Mucous membranes are dry. No oropharyngeal exudate, posterior oropharyngeal edema or posterior oropharyngeal erythema.  Eyes: EOM are normal. Pupils are equal, round, and reactive to light.  Neck: Normal range of motion. Neck supple.  Cardiovascular: Normal rate, regular rhythm and normal heart sounds.   Pulmonary/Chest: Effort normal. No accessory muscle usage. No tachypnea. No respiratory distress. He has rhonchi.  Abdominal: Soft. Bowel sounds are normal. He exhibits no distension and no mass. There is no tenderness. There is no  rebound and no guarding.  Musculoskeletal: Normal range of motion.  Neurological: He is alert. He has normal strength. No cranial nerve deficit or sensory deficit.  Skin: Skin is warm and dry. He is not diaphoretic.  Psychiatric: He has a normal mood and affect.  Nursing note and vitals reviewed.   ED Course  Procedures (including critical care time) Labs Review Labs Reviewed  CBC WITH DIFFERENTIAL/PLATELET - Abnormal; Notable for the following:    WBC 20.5 (*)    Neutrophils Relative % 92 (*)    Neutro Abs 18.9 (*)    Lymphocytes Relative 3 (*)    All other components within normal limits  COMPREHENSIVE METABOLIC PANEL - Abnormal; Notable for the following:    Glucose, Bld 348 (*)    BUN 24 (*)  Creatinine, Ser 2.03 (*)    GFR calc non Af Amer 30 (*)    GFR calc Af Amer 35 (*)    All other components within normal limits  URINALYSIS, ROUTINE W REFLEX MICROSCOPIC - Abnormal; Notable for the following:    Color, Urine AMBER (*)    Glucose, UA >1000 (*)    Hgb urine dipstick TRACE (*)    Bilirubin Urine SMALL (*)    Protein, ur 100 (*)    All other components within normal limits  CBC WITH DIFFERENTIAL/PLATELET - Abnormal; Notable for the following:    WBC 11.9 (*)    RBC 4.06 (*)    Hemoglobin 12.2 (*)    HCT 37.1 (*)    Neutrophils Relative % 83 (*)    Neutro Abs 9.9 (*)    Lymphocytes Relative 9 (*)    All other components within normal limits  BASIC METABOLIC PANEL - Abnormal; Notable for the following:    Glucose, Bld 158 (*)    BUN 24 (*)    Creatinine, Ser 1.63 (*)    Calcium 8.2 (*)    GFR calc non Af Amer 39 (*)    GFR calc Af Amer 45 (*)    All other components within normal limits  I-STAT CG4 LACTIC ACID, ED - Abnormal; Notable for the following:    Lactic Acid, Venous 4.36 (*)    All other components within normal limits  CULTURE, BLOOD (ROUTINE X 2)  MRSA PCR SCREENING  CULTURE, BLOOD (ROUTINE X 2)  URINE CULTURE  GRAM STAIN  CULTURE, EXPECTORATED  SPUTUM-ASSESSMENT  URINE MICROSCOPIC-ADD ON  LACTIC ACID, PLASMA  STREP PNEUMONIAE URINARY ANTIGEN  SODIUM, URINE, RANDOM  CREATININE, URINE, RANDOM  PROCALCITONIN  INFLUENZA PANEL BY PCR (TYPE A & B, H1N1)  PROCALCITONIN  LEGIONELLA ANTIGEN, URINE  HEMOGLOBIN A1C  HIV ANTIBODY (ROUTINE TESTING)  BASIC METABOLIC PANEL  CBC  PROCALCITONIN  I-STAT TROPOININ, ED  I-STAT CG4 LACTIC ACID, ED    Imaging Review Dg Chest 2 View  02/06/2014   CLINICAL DATA:  80 year old male with confusion, sinus congestion, nausea and vomiting.  EXAM: CHEST  2 VIEW  COMPARISON:  Prior chest x-ray 08/29/2011  FINDINGS: Cardiac and mediastinal contours remain normal. Right subclavian approach cardiac rhythm maintenance device with leads projecting over the right atrium and right ventricle. Atherosclerotic calcifications present in the transverse aorta. Patchy airspace opacity in the left lower lobe is an interval finding compared to prior. Otherwise, the lungs are clear. No pulmonary edema, pleural effusion, pneumothorax or suspicious pulmonary nodule or mass.  IMPRESSION: 1. Patchy left lower lobe airspace opacity concerning for bronchopneumonia in the appropriate clinical setting. Recommend imaging followup to resolution with repeat chest x-ray in 4- 6 weeks following an appropriate course of therapy. 2. Aortic atherosclerosis.   Electronically Signed   By: Jacqulynn Cadet M.D.   On: 02/06/2014 13:25   Ct Head Wo Contrast  02/06/2014   CLINICAL DATA:  NASAL CONGESTION EMESIS headache AMS. PT. EXPLAINS HE HAS BEEN UNABLE TO REMEMBER THINGS SINCE Wednesday, HX MINI-STROKE, COUGH, SINUS HEADACHE, LW  EXAM: CT HEAD WITHOUT CONTRAST  TECHNIQUE: Contiguous axial images were obtained from the base of the skull through the vertex without intravenous contrast.  COMPARISON:  01/29/2011  FINDINGS: The ventricles and cisterns are within normal. Remaining CSF spaces are unremarkable. There is evidence of chronic ischemic  microvascular disease unchanged. Moderate bilateral basal ganglia calcifications are unchanged. There is a small focal region of low-attenuation over  the high left frontal gray-white matter likely an old infarct. No definite evidence of acute ischemia. No mass, mass effect or shift of midline structures. No acute hemorrhage. Complete opacification over the left maxillary sinus with opacification over the ethmoid and minimally over the sphenoid sinuses compatible with chronic inflammatory change. Remaining bony structures are unremarkable.  IMPRESSION: No acute intracranial findings.  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  Moderate chronic sinus inflammatory disease predominately involving the left maxillary sinus.   Electronically Signed   By: Marin Olp M.D.   On: 02/06/2014 13:37     EKG Interpretation None     12:50 PM Patient found to have a WBC of 20.5 and an elevated lactate.  Will order aggressive IVF. 1:20 PM Patient evaluated by Dr. Ashok Cordia.  Will order Vancomycin and Zosyn to give broad spectrum antibiotics while CXR and UA pending. 2:05  Patient discussed with Internal Medicine Teaching Service who has agreed to admit the patient.    MDM   Final diagnoses:  Cough   Patient presents today with cough and congestion for three days.  He also had some confusion this morning that his wife noticed.  Patient non toxic appearing on exam.  Patient with a mildly elevated temp of 100.1 F orally and also mild tachycardia. Initially patient did not meet SIRS criteria.  WBC found to be elevated at 20.5.  Lactate also elevated at 4.36.  Labs consistent with Sepsis.  Blood cultures pending.  CXR showing a Pneumonia.  Patient currently lives at home with wife and denies any hospitalizations in the past 90 days.  Patient also with a Creatine of 2.03, which was up from his baseline.  Patient given IVF in the ED.  Patient also given Vancomycin and Zosyn IV.   Patient admitted to [facility] service, which is Internal Medicine Teaching Service a this time.         Hyman Bible, PA-C 02/07/14 [place], MD 02/15/14 [phone]

## 2014-02-06 NOTE — ED Notes (Signed)
1st set of cultures obtained.

## 2014-02-06 NOTE — Progress Notes (Signed)
Patient settled and eating at this time. Tele placed. Patient has pacemaker.

## 2014-02-06 NOTE — ED Notes (Signed)
Pt presents to department for evaluation of sinus congestion, nausea/vomiting and confusion. Wife states he has been taking OTC medications for sinus congestion and runny nose. States he had several episodes of nausea/vomiting and confusion last night. Pt is alert and oriented x4 upon arrival to ED. No neurological deficits noted.

## 2014-02-06 NOTE — ED Notes (Signed)
Patient transported to X-ray without distress.  

## 2014-02-06 NOTE — ED Notes (Signed)
Abnormal lactic acid given to PA [employer]

## 2014-02-06 NOTE — ED Notes (Signed)
MD at bedside. 

## 2014-02-06 NOTE — ED Notes (Signed)
Pt remains in xray.

## 2014-02-06 NOTE — H&P (Signed)
Date: 02/06/2014               Patient Name:  Thomas Snow MRN: [id number]  DOB: 1934-11-07 Age / Sex: 80 y.o., male   PCP: Cyndi Bender, PA-C         Medical Service: Internal Medicine Teaching Service         Attending Physician: Dr. Thayer Headings, MD    First Contact: Dr. Albin Felling Pager: [phone]  Second Contact: Dr. Juluis Mire Pager: [phone]       After Hours (After 5p/  First Contact Pager: [phone]  weekends / holidays): Second Contact Pager: [phone]   Chief Complaint: Cough, congestion  History of Present Illness: [name] is a 80yo man w/ PMHx of HTN, HLD, CAD s/p RCA angioplasty in 2001, carotid artery occlusion s/p stent in 2001, and paroxysmal AFib on Xarelto who presented to the ED with cough and congestion. Patient reports over the last 2-3 days he has had significant congestion and a productive cough. He notes his sputum has been green in color and very loose. He reports sinus pain, sore throat, and wheezing. He denies dyspnea, pleuritic chest pain, and fever. His wife notes he had nausea and vomiting x 3 last night and then another episode of emesis while in the hospital. He denies any abdominal pain. His wife also reports he had some confusion last night and this morning. Patient had asked his wife, "Whose house is this?" when he has lived in the same house for the past 28 years. Wife notes his mental status is back to baseline now.   In the ED, patient's lactic acid was 4.36 and CXR showed a LLL opacity. Patient received a 1.5 L bolus and was started on Vanc and Zosyn.   Meds: Current Facility-Administered Medications  Medication Dose Route Frequency Provider Last Rate Last Dose  . piperacillin-tazobactam (ZOSYN) IVPB 3.375 g  3.375 g Intravenous Once Heather Laisure, PA-C 100 mL/hr at 02/06/14 1348 3.375 g at 02/06/14 1348  . piperacillin-tazobactam (ZOSYN) IVPB 3.375 g  3.375 g Intravenous Q8H Mirna Mires, MD      . sodium chloride 0.9 % bolus 500  mL  500 mL Intravenous Q1H Heather Laisure, PA-C      . [START ON 02/07/2014] vancomycin (VANCOCIN) 500 mg in sodium chloride 0.9 % 100 mL IVPB  500 mg Intravenous Q24H Mirna Mires, MD      . vancomycin (VANCOCIN) IVPB 1000 mg/200 mL premix  1,000 mg Intravenous Once Hyman Bible, PA-C       Current Outpatient Prescriptions  Medication Sig Dispense Refill  . acetaminophen (TYLENOL) 325 MG tablet Take 650 mg by mouth 3 (three) times daily.    . Chlorpheniramine-Acetaminophen (CORICIDIN HBP COLD/FLU PO) Take 2 tablets by mouth.    . fish oil-omega-3 fatty acids 1000 MG capsule Take 1 g by mouth daily.    . Menthol, Topical Analgesic, 8 % LIQD Apply 1 application topically 2 (two) times daily as needed.    Marland Kitchen OVER THE COUNTER MEDICATION Take by mouth daily. Vitamin B12 - unknown dose.    . phenylephrine (SUDAFED PE) 10 MG TABS tablet Take 20 mg by mouth every 6 (six) hours as needed.    . pravastatin (PRAVACHOL) 20 MG tablet Take 1 tablet (20 mg total) by mouth every evening. 90 tablet 1  . ramipril (ALTACE) 10 MG capsule Take 1 capsule (10 mg total) by mouth daily. 90 capsule 1  . rivaroxaban (XARELTO) 20  MG TABS tablet Take 1 tablet (20 mg total) by mouth daily with supper. (Patient taking differently: Take 20 mg by mouth at bedtime. ) 30 tablet 11  . sodium chloride (OCEAN) 0.65 % SOLN nasal spray Place 1 spray into both nostrils as needed for congestion.    . triamcinolone cream (KENALOG) 0.1 % Apply 1 application topically 2 (two) times daily as needed.       Allergies: Allergies as of 02/06/2014 - Review Complete 02/06/2014  Allergen Reaction Noted  . Atorvastatin  04/01/2013  . Crestor [rosuvastatin]  04/01/2013   Past Medical History  Diagnosis Date  . Hyperlipidemia   . Hypertension   . Pneumonia     as a child  . Pacemaker   . Arthritis   . Coronary artery disease     prox and mid RCA atherectomy '01  . Carotid artery occlusion     left SCA stent '01, carotid occlusive  dz   Past Surgical History  Procedure Laterality Date  . Kidney stone surgery    . Cardiac catheterization    . Coronary angioplasty    . Kidney stent      left RAS '05  . Tonsillectomy    . Back surgery      approx 20 years ago  . Eye surgery      bilateral cataract removal  . Knee surgery      right knee  . Cardiac pacemaker placement      [place] '85, '95; [place] '06  . Endarterectomy  09/05/2011    Procedure: ENDARTERECTOMY CAROTID;  Surgeon: Mal Misty, MD;  Location: [facility];  Service: Vascular;  Laterality: Right;  . Carotid endarterectomy Right 09-05-11    cea  . Peripheral vascular angiogram  01/22/2003    Left subclavian 85% fairly focal in-stent restenosis, dilated with a 7x2cm Powerflex balloon at 10-30 and 10-30, resulting in less than 10% residual narrowing. Left renal artery demonstrated 80-85% initially dilated with a 4x15 mm coronary cutting balloon at 10-30 and 12-30, exchanged for a 28mmx2cm Aviator balloon inflations were done at 6-15, 10-30, and 10-30, resulting less than 10% residual.  . Peripheral vascular angiogram  07/13/1999    Left subclavian 85% dilated with a 14mmx2cm Cordis Power-Flex balloon at 6-40, a P-204 iliac stent was hand-crimped on a 56mmx2cm Cordis Power-Flex balloon, expanded at 8-40 with reduction to 0%. Left Renal artery 75-80% stenosis, dilated with a 32mmx1.5cm Cordis Power-Flex balloon at 6-40, reduction of stenosis from 80% to 0%.  . Renal doppler  09/25/2012    Celiac artery and SMA-demonstrates narrowing with increased velocities consistent with greater than 50%, right renal- 1-59% diameter reduction, left renal artery stent- 1-59% diameter reduction.  . Cardiac catheterization  03/09/1999    RCA high-grade 95% stenosis, inflated with a 3.25 IVT cutting balloon at 6-64, 6-41, 6-52, and 6-53, resulting in reductiong of less than 10%. RCA proximal 75% stenosis inflated with a 2.75x10 IVT cutting balloon at 6-65 then exchanged for a 3x10 IVT  cutting balloon inflated at 6-83.  . Cardiovascular stress test  08/09/2010    Normal pattern of perfusion in all regions. No ECG changes. EKG negative for ischemia.  . Transthoracic echocardiogram  09-25-2012    EF 55-60%, mild-moderate tricuspid valve regurg  . Icd generator change  10/20/2004    Implantation of [facility] [place], model # 5357M/s, serial # [id number]   Family History  Problem Relation Age of Onset  . Arthritis Father  History   Social History  . Marital Status: Married    Spouse Name: N/A    Number of Children: N/A  . Years of Education: N/A   Occupational History  . Not on file.   Social History Main Topics  . Smoking status: Former Smoker -- 0.50 packs/day for 25 years    Types: Cigarettes    Quit date: 08/19/1996  . Smokeless tobacco: Current User    Types: Snuff  . Alcohol Use: No  . Drug Use: No  . Sexual Activity: Not on file   Other Topics Concern  . Not on file   Social History Narrative    Review of Systems: General: Denies night sweats, changes in appetite HEENT: Denies headaches, ear pain, changes in vision CV: Denies palpitations, orthopnea Pulm: See HPI GI: Denies diarrhea, constipation, melena, hematochezia GU: Denies dysuria, hematuria, frequency Msk: Denies muscle cramps, joint pains Neuro: Denies weakness, numbness, tingling Skin: Denies rashes, bruising  Physical Exam: Blood pressure 131/51, pulse 103, temperature 99.6 F (37.6 C), temperature source Oral, resp. rate 16, SpO2 96 %. General: thin elderly man sitting up in bed, pleasant, NAD HEENT: [place]/AT, EOMI, pharynx erythematous, mucus membranes dry Neck: supple, no lymphadenopathy CV: tachycardic in [id number], 2/6 systolic murmur Pulm: rhonchi bilaterally, left worse than right  Abd: BS+, soft, non-tender Ext: warm, no edema, moves all Neuro: alert and oriented x 3, no focal deficits, strength intact  Lab results: Basic Metabolic Panel:  Recent Labs   02/06/14 1140  NA 137  K 4.2  CL 98  CO2 31  GLUCOSE 348*  BUN 24*  CREATININE 2.03*  CALCIUM 9.8   Liver Function Tests:  Recent Labs  02/06/14 1140  AST 36  ALT 14  ALKPHOS 58  BILITOT 0.6  PROT 6.9  ALBUMIN 3.6   CBC:  Recent Labs  02/06/14 1140  WBC 20.5*  NEUTROABS 18.9*  HGB 13.5  HCT 40.2  MCV 88.7  PLT 265   Urinalysis:  Recent Labs  02/06/14 1258  COLORURINE AMBER*  LABSPEC 1.030  PHURINE 5.5  GLUCOSEU >1000*  HGBUR TRACE*  BILIRUBINUR SMALL*  KETONESUR NEGATIVE  PROTEINUR 100*  UROBILINOGEN 0.2  NITRITE NEGATIVE  LEUKOCYTESUR NEGATIVE    Imaging results:  Dg Chest 2 View  02/06/2014   CLINICAL DATA:  80 year old male with confusion, sinus congestion, nausea and vomiting.  EXAM: CHEST  2 VIEW  COMPARISON:  Prior chest x-ray 08/29/2011  FINDINGS: Cardiac and mediastinal contours remain normal. Right subclavian approach cardiac rhythm maintenance device with leads projecting over the right atrium and right ventricle. Atherosclerotic calcifications present in the transverse aorta. Patchy airspace opacity in the left lower lobe is an interval finding compared to prior. Otherwise, the lungs are clear. No pulmonary edema, pleural effusion, pneumothorax or suspicious pulmonary nodule or mass.  IMPRESSION: 1. Patchy left lower lobe airspace opacity concerning for bronchopneumonia in the appropriate clinical setting. Recommend imaging followup to resolution with repeat chest x-ray in 4- 6 weeks following an appropriate course of therapy. 2. Aortic atherosclerosis.   Electronically Signed   By: Jacqulynn Cadet M.D.   On: 02/06/2014 13:25   Ct Head Wo Contrast  02/06/2014   CLINICAL DATA:  NASAL CONGESTION EMESIS headache AMS. PT. EXPLAINS HE HAS BEEN UNABLE TO REMEMBER THINGS SINCE Wednesday, HX MINI-STROKE, COUGH, SINUS HEADACHE, LW  EXAM: CT HEAD WITHOUT CONTRAST  TECHNIQUE: Contiguous axial images were obtained from the base of the skull through the vertex  without intravenous contrast.  COMPARISON:  01/29/2011  FINDINGS: The ventricles and cisterns are within normal. Remaining CSF spaces are unremarkable. There is evidence of chronic ischemic microvascular disease unchanged. Moderate bilateral basal ganglia calcifications are unchanged. There is a small focal region of low-attenuation over the high left frontal gray-white matter likely an old infarct. No definite evidence of acute ischemia. No mass, mass effect or shift of midline structures. No acute hemorrhage. Complete opacification over the left maxillary sinus with opacification over the ethmoid and minimally over the sphenoid sinuses compatible with chronic inflammatory change. Remaining bony structures are unremarkable.  IMPRESSION: No acute intracranial findings.  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  Moderate chronic sinus inflammatory disease predominately involving the left maxillary sinus.   Electronically Signed   By: Marin Olp M.D.   On: 02/06/2014 13:37   EKG: pending   Assessment & Plan by Problem: Principal Problem:   Community acquired pneumonia Active Problems:   CAD s/p RCA angioplasty 2001   Pacemaker - dual chamber, implanted in 1985, last generator change 2006, [employer]   Underweight   Hyperlipidemia   Paroxysmal atrial fibrillation   Acute renal failure superimposed on stage 3 chronic kidney disease   Sepsis   Hypertension   Elevated fasting glucose  Sepsis Secondary to CAP: Patient presented with 2-3 day hx of productive cough, congestion, nausea, and vomiting. Found to have WBC count 20.5, tachycardic in 100s, and tachypnic with a LLL opacity on CXR concerning for pneumonia. Lactic acid 4.36. Patient received 1.5 L IVF and Vanc/Zosyn in the ED. Will treat as community acquired pneumonia since coming from home and no recent hospitalizations.  - Stop Vanc/Zosyn - Start Ceftriaxone and Azithromycin  - Continue IVF @ 100 -  Check strep pneumo and legionella antigens - Check HIV - Obtain sputum culture, gram stain  - Check influenza  - f/u blood cultures - Repeat lactic acid - Check PCT - Mucinex BID  AKI on CKD Stage 3: Cr 2.03 on admission, baseline appears to be 1.3-1.4. Patient appears hypovolemic on exam, likely due to volume depletion. Will check FeNa. - IVF @ 100 - Check urine sodium, urine Cr - Repeat bmet in AM  Elevated Fasting Glucose: Glucose 348 on admission. Patient notes he has been told he has "prediabetes" before. Could be secondary to infection.  - Check HbA1c   Paroxysmal AFib: Patient is on Xarelto at home.  - Check EKG - Continue Xarelto 20 mg daily   Underweight, Malnutrition: Patient currently weighs 112 lb, was at 118 lbs 2 years ago. Wife notes patient has a poor appetite. He is thin appearing on exam. - Will consult nutrition  - Check HIV  HTN: Patient currently normotensive with SBP in 100s-130s. He takes Ramipril 10 mg daily at home.  - Hold Ramipril for now given sepsis   HLD: Last lipid profile in June 2014 showed Chol 168, Trigly 183, HDL 39, and LDL 92. On Pravastatin 20 mg QHS at home.  - Continue home Pravastatin   CAD s/p RCA angioplasty in 2001, pacemaker: No symptoms of coronary artery disease. Last echo in Sept 2014 shows preserved EF 55-60% and no wall motion abnormalities.  - Hold home Ramipril given sepsis  Diet: Heart healthy VTE PPx: Xarelto  Dispo: Disposition is deferred at this time, awaiting improvement of current medical problems.   The patient does have a current PCP Cyndi Bender, PA-C) and does need an [facility] hospital follow-up appointment after discharge.  The patient  does not have transportation limitations that hinder transportation to clinic appointments.  Signed: Albin Felling, MD 02/06/2014, 2:18 PM

## 2014-02-06 NOTE — ED Notes (Signed)
Pt returned from xray and CT.

## 2014-02-06 NOTE — ED Provider Notes (Signed)
Pt c/o 3-4 episodes nv onset last pm. In ED w low grade fever and sounds congested.  Pt states cough started today.  Rhonchi. Iv ns boluses. Iv abx. Admit.   Date: 02/06/2014  Rate: 96  Rhythm: normal sinus rhythm  QRS Axis: normal  Intervals: normal  ST/T Wave abnormalities: normal  Conduction Disutrbances:none  Narrative Interpretation:   Old EKG Reviewed: unchanged       Mirna Mires, MD 02/06/14 1355

## 2014-02-07 LAB — CBC WITH DIFFERENTIAL/PLATELET
Basophils Absolute: 0 10*3/uL (ref 0.0–0.1)
Basophils Relative: 0 % (ref 0–1)
Eosinophils Absolute: 0.1 10*3/uL (ref 0.0–0.7)
Eosinophils Relative: 1 % (ref 0–5)
HEMATOCRIT: 37.1 % — AB (ref 39.0–52.0)
Hemoglobin: 12.2 g/dL — ABNORMAL LOW (ref 13.0–17.0)
Lymphocytes Relative: 9 % — ABNORMAL LOW (ref 12–46)
Lymphs Abs: 1 10*3/uL (ref 0.7–4.0)
MCH: 30 pg (ref 26.0–34.0)
MCHC: 32.9 g/dL (ref 30.0–36.0)
MCV: 91.4 fL (ref 78.0–100.0)
Monocytes Absolute: 0.9 10*3/uL (ref 0.1–1.0)
Monocytes Relative: 7 % (ref 3–12)
Neutro Abs: 9.9 10*3/uL — ABNORMAL HIGH (ref 1.7–7.7)
Neutrophils Relative %: 83 % — ABNORMAL HIGH (ref 43–77)
PLATELETS: 173 10*3/uL (ref 150–400)
RBC: 4.06 MIL/uL — ABNORMAL LOW (ref 4.22–5.81)
RDW: 14 % (ref 11.5–15.5)
WBC: 11.9 10*3/uL — ABNORMAL HIGH (ref 4.0–10.5)

## 2014-02-07 LAB — INFLUENZA PANEL BY PCR (TYPE A & B)
H1N1 flu by pcr: NOT DETECTED
INFLBPCR: NEGATIVE
Influenza A By PCR: NEGATIVE

## 2014-02-07 LAB — MRSA PCR SCREENING: MRSA by PCR: NEGATIVE

## 2014-02-07 LAB — BASIC METABOLIC PANEL
Anion gap: 8 (ref 5–15)
BUN: 24 mg/dL — ABNORMAL HIGH (ref 6–23)
CO2: 22 mmol/L (ref 19–32)
CREATININE: 1.63 mg/dL — AB (ref 0.50–1.35)
Calcium: 8.2 mg/dL — ABNORMAL LOW (ref 8.4–10.5)
Chloride: 109 mmol/L (ref 96–112)
GFR calc non Af Amer: 39 mL/min — ABNORMAL LOW (ref >90)
GFR, EST AFRICAN AMERICAN: 45 mL/min — AB (ref >90)
GLUCOSE: 158 mg/dL — AB (ref 70–99)
Potassium: 4.2 mmol/L (ref 3.5–5.1)
SODIUM: 139 mmol/L (ref 135–145)

## 2014-02-07 LAB — SODIUM, URINE, RANDOM: SODIUM UR: 73 mmol/L

## 2014-02-07 LAB — CREATININE, URINE, RANDOM: Creatinine, Urine: 220.24 mg/dL

## 2014-02-07 LAB — STREP PNEUMONIAE URINARY ANTIGEN: Strep Pneumo Urinary Antigen: NEGATIVE

## 2014-02-07 LAB — PROCALCITONIN: Procalcitonin: 3.14 ng/mL

## 2014-02-07 MED ORDER — ALBUTEROL SULFATE (2.5 MG/3ML) 0.083% IN NEBU: 5.0000 mg | INHALATION_SOLUTION | Freq: Once | RESPIRATORY_TRACT | Status: DC

## 2014-02-07 MED ORDER — CETYLPYRIDINIUM CHLORIDE 0.05 % MT LIQD
7.0000 mL | Freq: Two times a day (BID) | OROMUCOSAL | Status: DC
  Administered 2014-02-07 – 2014-02-08 (×3): 7 mL via OROMUCOSAL

## 2014-02-07 MED ORDER — CEFTRIAXONE SODIUM IN DEXTROSE 40 MG/ML IV SOLN: 2.0000 g | INTRAVENOUS | Status: DC

## 2014-02-07 MED ORDER — AZITHROMYCIN 250 MG PO TABS
250.0000 mg | ORAL_TABLET | Freq: Every day | ORAL | Status: DC
  Administered 2014-02-08: 250 mg via ORAL
  Filled 2014-02-07: qty 1

## 2014-02-07 MED ORDER — DEXTROSE 5 % IV SOLN: 1.0000 g | INTRAVENOUS | Status: DC

## 2014-02-07 MED ORDER — AZITHROMYCIN 250 MG PO TABS
250.0000 mg | ORAL_TABLET | Freq: Once | ORAL | Status: AC
  Administered 2014-02-07: 250 mg via ORAL
  Filled 2014-02-07: qty 1

## 2014-02-07 MED ORDER — AZITHROMYCIN 250 MG PO TABS
250.0000 mg | ORAL_TABLET | Freq: Once | ORAL | Status: DC
  Filled 2014-02-07: qty 1

## 2014-02-07 MED ORDER — AZITHROMYCIN 500 MG PO TABS: 500.0000 mg | ORAL_TABLET | Freq: Every day | ORAL | Status: DC

## 2014-02-07 MED ORDER — SODIUM CHLORIDE 0.9 % IV SOLN
INTRAVENOUS | Status: AC
  Administered 2014-02-07 – 2014-02-08 (×2): via INTRAVENOUS

## 2014-02-07 MED ORDER — AZITHROMYCIN 250 MG PO TABS: 250.0000 mg | ORAL_TABLET | Freq: Every day | ORAL | Status: DC

## 2014-02-07 MED ORDER — CEFTRIAXONE SODIUM IN DEXTROSE 40 MG/ML IV SOLN
2.0000 g | INTRAVENOUS | Status: DC
  Administered 2014-02-07 – 2014-02-08 (×2): 2 g via INTRAVENOUS
  Filled 2014-02-07 (×2): qty 50

## 2014-02-07 NOTE — Progress Notes (Signed)
Subjective: Patient did not receive Ceftriaxone or Azithromycin last night. Unclear why this was not done. Appears he only received Xarelto and no other medications that were ordered. I have confirmed with nurse and pharmacy that patient is to receive Azithro 500 mg and Ceftriaxone 1 g IV today. Overnight, he spiked a fever of 102. Patient states he continues to have a productive cough and congestion, but denies dyspnea.   Objective: Vital signs in last 24 hours: Filed Vitals:   02/06/14 2100 02/06/14 2238 02/07/14 0714 02/07/14 0718  BP: 108/49   123/53  Pulse:    89  Temp: 102 F (38.9 C) 100.7 F (38.2 C)  99.7 F (37.6 C)  TempSrc: Oral Oral  Oral  Resp: 14   16  Height:      Weight:   110 lb 3.2 oz (49.986 kg)   SpO2: 96%   91%   Weight change:   Intake/Output Summary (Last 24 hours) at 02/07/14 0959 Last data filed at 02/07/14 0720  Gross per 24 hour  Intake      0 ml  Output    275 ml  Net   -275 ml   Physical Exam General: thin elderly man sitting up in bed, pleasant, NAD HEENT: [place]/AT, EOMI, pharynx erythematous, mucus membranes dry Neck: supple, no lymphadenopathy CV: RRR, 2/6 systolic murmur Pulm: rhonchi bilaterally, left worse than right  Abd: BS+, soft, non-tender Ext: warm, no edema, moves all Neuro: alert and oriented x 3, no focal deficits, strength intact  Lab Results: Basic Metabolic Panel:  Recent Labs Lab 02/06/14 1140 02/07/14 0618  NA 137 139  K 4.2 4.2  CL 98 109  CO2 31 22  GLUCOSE 348* 158*  BUN 24* 24*  CREATININE 2.03* 1.63*  CALCIUM 9.8 8.2*   Liver Function Tests:  Recent Labs Lab 02/06/14 1140  AST 36  ALT 14  ALKPHOS 58  BILITOT 0.6  PROT 6.9  ALBUMIN 3.6   CBC:  Recent Labs Lab 02/06/14 1140 02/07/14 0618  WBC 20.5* 11.9*  NEUTROABS 18.9* 9.9*  HGB 13.5 12.2*  HCT 40.2 37.1*  MCV 88.7 91.4  PLT 265 173   Urinalysis:  Recent Labs Lab 02/06/14 1258  COLORURINE AMBER*  LABSPEC 1.030  PHURINE 5.5    GLUCOSEU >1000*  HGBUR TRACE*  BILIRUBINUR SMALL*  KETONESUR NEGATIVE  PROTEINUR 100*  UROBILINOGEN 0.2  NITRITE NEGATIVE  LEUKOCYTESUR NEGATIVE   Micro Results: Recent Results (from the past 240 hour(s))  Blood Culture (routine x 2)     Status: None (Preliminary result)   Collection Time: 02/06/14  1:40 PM  Result Value Ref Range Status   Specimen Description BLOOD RIGHT ARM  Final   Special Requests BOTTLES DRAWN AEROBIC AND ANAEROBIC 10CC  Final   Culture   Final           BLOOD CULTURE RECEIVED NO GROWTH TO DATE CULTURE WILL BE HELD FOR 5 DAYS BEFORE ISSUING A FINAL NEGATIVE REPORT Performed at [facility]    Report Status PENDING  Incomplete  MRSA PCR Screening     Status: None   Collection Time: 02/06/14  8:06 PM  Result Value Ref Range Status   MRSA by PCR NEGATIVE NEGATIVE Final    Comment:        The GeneXpert MRSA Assay (FDA approved for NASAL specimens only), is one component of a comprehensive MRSA colonization surveillance program. It is not intended to diagnose MRSA infection nor to guide or monitor  treatment for MRSA infections.    Studies/Results: Dg Chest 2 View  02/06/2014   CLINICAL DATA:  80 year old male with confusion, sinus congestion, nausea and vomiting.  EXAM: CHEST  2 VIEW  COMPARISON:  Prior chest x-ray 08/29/2011  FINDINGS: Cardiac and mediastinal contours remain normal. Right subclavian approach cardiac rhythm maintenance device with leads projecting over the right atrium and right ventricle. Atherosclerotic calcifications present in the transverse aorta. Patchy airspace opacity in the left lower lobe is an interval finding compared to prior. Otherwise, the lungs are clear. No pulmonary edema, pleural effusion, pneumothorax or suspicious pulmonary nodule or mass.  IMPRESSION: 1. Patchy left lower lobe airspace opacity concerning for bronchopneumonia in the appropriate clinical setting. Recommend imaging followup to resolution with repeat  chest x-ray in 4- 6 weeks following an appropriate course of therapy. 2. Aortic atherosclerosis.   Electronically Signed   By: Jacqulynn Cadet M.D.   On: 02/06/2014 13:25   Ct Head Wo Contrast  02/06/2014   CLINICAL DATA:  NASAL CONGESTION EMESIS headache AMS. PT. EXPLAINS HE HAS BEEN UNABLE TO REMEMBER THINGS SINCE Wednesday, HX MINI-STROKE, COUGH, SINUS HEADACHE, LW  EXAM: CT HEAD WITHOUT CONTRAST  TECHNIQUE: Contiguous axial images were obtained from the base of the skull through the vertex without intravenous contrast.  COMPARISON:  01/29/2011  FINDINGS: The ventricles and cisterns are within normal. Remaining CSF spaces are unremarkable. There is evidence of chronic ischemic microvascular disease unchanged. Moderate bilateral basal ganglia calcifications are unchanged. There is a small focal region of low-attenuation over the high left frontal gray-white matter likely an old infarct. No definite evidence of acute ischemia. No mass, mass effect or shift of midline structures. No acute hemorrhage. Complete opacification over the left maxillary sinus with opacification over the ethmoid and minimally over the sphenoid sinuses compatible with chronic inflammatory change. Remaining bony structures are unremarkable.  IMPRESSION: No acute intracranial findings.  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  Moderate chronic sinus inflammatory disease predominately involving the left maxillary sinus.   Electronically Signed   By: Marin Olp M.D.   On: 02/06/2014 13:37   Medications: I have reviewed the patient's current medications. Scheduled Meds: . albuterol  5 mg Nebulization Once  . albuterol  5 mg Nebulization Once  . antiseptic oral rinse  7 mL Mouth Rinse BID  . azithromycin  250 mg Oral Once  . [START ON 02/08/2014] azithromycin  250 mg Oral Daily  . cefTRIAXone (ROCEPHIN)  IV  1 g Intravenous Q24H  . dextromethorphan-guaiFENesin  1 tablet Oral BID  . feeding  supplement (ENSURE COMPLETE)  237 mL Oral BID BM  . fish oil-omega-3 fatty acids  1 g Oral Daily  . pravastatin  20 mg Oral QPM  . rivaroxaban  15 mg Oral QHS  . sodium chloride  3 mL Intravenous Q12H   Continuous Infusions: . sodium chloride 100 mL/hr at 02/07/14 0744   PRN Meds:.acetaminophen **OR** acetaminophen, albuterol, benzonatate, ondansetron Assessment/Plan: Principal Problem:   Community acquired pneumonia Active Problems:   CAD s/p RCA angioplasty 2001   Pacemaker - dual chamber, implanted in 1985, last generator change 2006, [employer]   Underweight   Hyperlipidemia   Paroxysmal atrial fibrillation   Acute renal failure superimposed on stage 3 chronic kidney disease   Sepsis   Hypertension   Elevated fasting glucose  CAP with SIRS: No longer with SIRS/sepsis clinically. Lactic acidosis resolved. Patient spiked fever of 102 last night. Unclear why  he only received his Xarelto medication last night as he had Ceftriaxone 1g IV and Azithromycin 500 mg PO ordered as well as other medications. His lungs still have rhonchi, more significant on the left.  - Start Ceftriaxone and Azithromycin. Depending on clinical status tomorrow can transition to Ceftin 500 mg BID PO for 4 days.  - Continue IVF @ 100 - Check strep pneumo and legionella antigens>> strep neg  - Check HIV>> pending  - Obtain sputum culture, gram stain >>pending  - Check influenza >> pending  - f/u blood cultures>> NGTD - Mucinex BID  AKI on CKD Stage 3: Cr 2.03 on admission, improved to 1.63 today. Baseline appears to be 1.3-1.4. FeNa 0.5%, likely due to volume depletion.  - IVF @ 100 - Repeat bmet in AM  Elevated Fasting Glucose: Glucose 348 on admission, 158 this morning. Patient notes he has been told he has "prediabetes" before. Could be secondary to infection.  - Check HbA1c >> pending   Paroxysmal AFib: Patient is on Xarelto at home.  - Check EKG - Continue Xarelto 20 mg daily   Underweight,  Malnutrition: Patient currently weighs 112 lb, was at 118 lbs 2 years ago. Wife notes patient has a poor appetite. He is thin appearing on exam. - Nutrition consulted, f/u note  - Check HIV>> pending   HTN: Patient currently normotensive with SBP in 100s-130s. He takes Ramipril 10 mg daily at home.  - Hold Ramipril for now   HLD: Last lipid profile in June 2014 showed Chol 168, Trigly 183, HDL 39, and LDL 92. On Pravastatin 20 mg QHS at home.  - Continue home Pravastatin   CAD s/p RCA angioplasty in 2001, pacemaker: No symptoms of coronary artery disease. Last echo in Sept 2014 shows preserved EF 55-60% and no wall motion abnormalities.  - Hold home Ramipril   Diet: Heart healthy VTE PPx: Xarelto  Dispo: Disposition is deferred at this time, awaiting improvement of current medical problems.  Anticipated discharge in approximately 1-2 day(s).   The patient does have a current PCP Cyndi Bender, PA-C) and does need an [facility] hospital follow-up appointment after discharge.  The patient does not have transportation limitations that hinder transportation to clinic appointments.  .Services Needed at time of discharge: Y = Yes, Blank = No PT:   OT:   RN:   Equipment:   Other:     LOS: 1 day   Albin Felling, MD 02/07/2014, 9:59 AM

## 2014-02-07 NOTE — Evaluation (Signed)
Physical Therapy Evaluation Patient Details Name: DVID BEDI MRN: [id number] DOB: 08-Oct-1934 Today's Date: 02/07/2014   History of Present Illness  Mr. Malotte is a 80yo man w/ PMHx of HTN, HLD, CAD s/p RCA angioplasty in 2001, carotid artery occlusion s/p stent in 2001, and paroxysmal AFib on Xarelto who presented to the ED with cough and congestion. Patient reports over the last 2-3 days he has had significant congestion and a productive cough.  Clinical Impression   Pt admitted with above diagnosis. Pt currently with functional limitations due to the deficits listed below (see PT Problem List).  Pt will benefit from skilled PT to increase their independence and safety with mobility to allow discharge to the venue listed below.       Follow Up Recommendations No PT follow up    Equipment Recommendations  Cane    Recommendations for Other Services       Precautions / Restrictions Precautions Precautions: Other (comment) Precaution Comments: Droplet      Mobility  Bed Mobility Overal bed mobility: Needs Assistance Bed Mobility: Supine to Sit     Supine to sit: Supervision     General bed mobility comments: Supervision mostly for lines  Transfers Overall transfer level: Needs assistance Equipment used: 1 person hand held assist;None Transfers: Sit to/from Stand Sit to Stand: Min guard         General transfer comment: minguard for safety; dependent on UEs to push and for steadiness  Ambulation/Gait Ambulation/Gait assistance: Min guard Ambulation Distance (Feet): 175 Feet Assistive device:  (pushing IV pole) Gait Pattern/deviations: Step-through pattern     General Gait Details: Occasionally using hallway rail for steadiness, but needing less UE support as pt walked more; Mask on outside the room  Stairs            Wheelchair Mobility    Modified Rankin (Stroke Patients Only)       Balance Overall balance assessment: Needs assistance            Standing balance-Leahy Scale: Fair                               Pertinent Vitals/Pain Pain Assessment: No/denies pain    Home Living Family/patient expects to be discharged to:: Private residence Living Arrangements: Spouse/significant other Available Help at Discharge: Family;Available 24 hours/day Type of Home: House       Home Layout: Two level;Able to live on main level with bedroom/bathroom Home Equipment: None      Prior Function Level of Independence: Independent               Hand Dominance        Extremity/Trunk Assessment   Upper Extremity Assessment: Overall WFL for tasks assessed           Lower Extremity Assessment: Generalized weakness (with noted dependence on UE push with sit<>stand)         Communication   Communication: No difficulties  Cognition Arousal/Alertness: Awake/alert Behavior During Therapy: WFL for tasks assessed/performed Overall Cognitive Status: Within Functional Limits for tasks assessed (though a bit impulsive)                      General Comments General comments (skin integrity, edema, etc.): Session conducted on Room Air, and O2 sats remained greater than or equal to 93%    Exercises Other Exercises Other Exercises: Instructed pt in the use of incentive  spirometry      Assessment/Plan    PT Assessment Patient needs continued PT services  PT Diagnosis Difficulty walking;Generalized weakness   PT Problem List Decreased strength;Decreased activity tolerance;Decreased balance;Decreased mobility;Decreased knowledge of use of DME;Decreased safety awareness;Decreased knowledge of precautions;Cardiopulmonary status limiting activity  PT Treatment Interventions DME instruction;Gait training;Stair training;Functional mobility training;Therapeutic activities;Therapeutic exercise;Patient/family education   PT Goals (Current goals can be found in the Care Plan section) Acute Rehab PT  Goals Patient Stated Goal: hopes to go home soon PT Goal Formulation: With patient Time For Goal Achievement: 02/21/14 Potential to Achieve Goals: Good    Frequency Min 3X/week   Barriers to discharge        Co-evaluation               End of Session   Activity Tolerance: Patient tolerated treatment well Patient left: in chair;with call bell/phone within reach;with family/visitor present Nurse Communication: Mobility status         Time: [id number] PT Time Calculation (min) (ACUTE ONLY): 21 min   Charges:   PT Evaluation $Initial PT Evaluation Tier I: 1 Procedure     PT G CodesQuin Hoop 02/07/2014, 3:56 PM  Roney Marion, [place] Pager [phone] Office [phone]

## 2014-02-08 DIAGNOSIS — E1122 Type 2 diabetes mellitus with diabetic chronic kidney disease: Secondary | ICD-10-CM

## 2014-02-08 DIAGNOSIS — N189 Chronic kidney disease, unspecified: Secondary | ICD-10-CM

## 2014-02-08 DIAGNOSIS — E46 Unspecified protein-calorie malnutrition: Secondary | ICD-10-CM

## 2014-02-08 LAB — BASIC METABOLIC PANEL
ANION GAP: 7 (ref 5–15)
BUN: 18 mg/dL (ref 6–23)
CO2: 24 mmol/L (ref 19–32)
Calcium: 8.2 mg/dL — ABNORMAL LOW (ref 8.4–10.5)
Chloride: 108 mmol/L (ref 96–112)
Creatinine, Ser: 1.44 mg/dL — ABNORMAL HIGH (ref 0.50–1.35)
GFR, EST AFRICAN AMERICAN: 53 mL/min — AB (ref >90)
GFR, EST NON AFRICAN AMERICAN: 45 mL/min — AB (ref >90)
Glucose, Bld: 173 mg/dL — ABNORMAL HIGH (ref 70–99)
Potassium: 3.7 mmol/L (ref 3.5–5.1)
SODIUM: 139 mmol/L (ref 135–145)

## 2014-02-08 LAB — LEGIONELLA ANTIGEN, URINE

## 2014-02-08 LAB — HEMOGLOBIN A1C
HEMOGLOBIN A1C: 7 % — AB (ref 4.8–5.6)
Mean Plasma Glucose: 154 mg/dL

## 2014-02-08 LAB — CBC
HCT: 36.6 % — ABNORMAL LOW (ref 39.0–52.0)
Hemoglobin: 12.3 g/dL — ABNORMAL LOW (ref 13.0–17.0)
MCH: 29.4 pg (ref 26.0–34.0)
MCHC: 33.6 g/dL (ref 30.0–36.0)
MCV: 87.6 fL (ref 78.0–100.0)
Platelets: 238 10*3/uL (ref 150–400)
RBC: 4.18 MIL/uL — AB (ref 4.22–5.81)
RDW: 13.6 % (ref 11.5–15.5)
WBC: 12.3 10*3/uL — ABNORMAL HIGH (ref 4.0–10.5)

## 2014-02-08 LAB — URINE CULTURE
CULTURE: NO GROWTH
Colony Count: NO GROWTH
Special Requests: NORMAL

## 2014-02-08 LAB — PROCALCITONIN: Procalcitonin: 1.48 ng/mL

## 2014-02-08 LAB — HIV ANTIBODY (ROUTINE TESTING W REFLEX): HIV Screen 4th Generation wRfx: NONREACTIVE

## 2014-02-08 MED ORDER — PHENOL 1.4 % MT LIQD: 1.0000 | OROMUCOSAL | Status: DC | PRN

## 2014-02-08 MED ORDER — DM-GUAIFENESIN ER 30-600 MG PO TB12: 1.0000 | ORAL_TABLET | Freq: Two times a day (BID) | ORAL | Status: DC

## 2014-02-08 MED ORDER — ENSURE COMPLETE PO LIQD: 237.0000 mL | Freq: Two times a day (BID) | ORAL | Status: DC

## 2014-02-08 MED ORDER — CEFUROXIME AXETIL 500 MG PO TABS: 500.0000 mg | ORAL_TABLET | Freq: Two times a day (BID) | ORAL | Status: DC

## 2014-02-08 MED ORDER — PHENOL 1.4 % MT LIQD
1.0000 | OROMUCOSAL | Status: DC | PRN
  Administered 2014-02-08: 1 via OROMUCOSAL
  Filled 2014-02-08: qty 177

## 2014-02-08 MED ORDER — AZITHROMYCIN 250 MG PO TABS: 250.0000 mg | ORAL_TABLET | Freq: Every day | ORAL | Status: DC

## 2014-02-08 NOTE — Progress Notes (Signed)
Subjective: Afebrile overnight. Patient states he is feeling much better this morning. He does note coughing, but not bringing any mucus up.   Objective: Vital signs in last 24 hours: Filed Vitals:   02/07/14 0718 02/07/14 1342 02/07/14 2307 02/08/14 0639  BP: 123/53 120/62 132/68 165/65  Pulse: 89  85   Temp: 99.7 F (37.6 C) 99 F (37.2 C) 98.8 F (37.1 C) 98.7 F (37.1 C)  TempSrc: Oral Oral Oral Oral  Resp: 16 18 20 20   Height:      Weight:    111 lb 3.2 oz (50.44 kg)  SpO2: 91% 96% 96% 96%   Weight change: -1 lb 12.8 oz (-0.816 kg)  Intake/Output Summary (Last 24 hours) at 02/08/14 0855 Last data filed at 02/08/14 0701  Gross per 24 hour  Intake    593 ml  Output    400 ml  Net    193 ml   Physical Exam General: thin elderly man sitting up in bed, pleasant, NAD HEENT: [place]/AT, EOMI, mucus membranes moist Neck: supple, no lymphadenopathy CV: RRR, 2/6 systolic murmur Pulm: coarse breath sounds bilaterally, L > R, improved compared to yesterday Abd: BS+, soft, non-tender Ext: warm, no edema, moves all Neuro: alert and oriented x 3, no focal deficits, strength intact  Lab Results: Basic Metabolic Panel:  Recent Labs Lab 02/06/14 1140 02/07/14 0618  NA 137 139  K 4.2 4.2  CL 98 109  CO2 31 22  GLUCOSE 348* 158*  BUN 24* 24*  CREATININE 2.03* 1.63*  CALCIUM 9.8 8.2*   Liver Function Tests:  Recent Labs Lab 02/06/14 1140  AST 36  ALT 14  ALKPHOS 58  BILITOT 0.6  PROT 6.9  ALBUMIN 3.6   CBC:  Recent Labs Lab 02/06/14 1140 02/07/14 0618  WBC 20.5* 11.9*  NEUTROABS 18.9* 9.9*  HGB 13.5 12.2*  HCT 40.2 37.1*  MCV 88.7 91.4  PLT 265 173   Urinalysis:  Recent Labs Lab 02/06/14 1258  COLORURINE AMBER*  LABSPEC 1.030  PHURINE 5.5  GLUCOSEU >1000*  HGBUR TRACE*  BILIRUBINUR SMALL*  KETONESUR NEGATIVE  PROTEINUR 100*  UROBILINOGEN 0.2  NITRITE NEGATIVE  LEUKOCYTESUR NEGATIVE    Micro Results: Recent Results (from the past 240  hour(s))  Blood Culture (routine x 2)     Status: None (Preliminary result)   Collection Time: 02/06/14  1:40 PM  Result Value Ref Range Status   Specimen Description BLOOD RIGHT ARM  Final   Special Requests BOTTLES DRAWN AEROBIC AND ANAEROBIC 10CC  Final   Culture   Final           BLOOD CULTURE RECEIVED NO GROWTH TO DATE CULTURE WILL BE HELD FOR 5 DAYS BEFORE ISSUING A FINAL NEGATIVE REPORT Performed at [facility]    Report Status PENDING  Incomplete  MRSA PCR Screening     Status: None   Collection Time: 02/06/14  8:06 PM  Result Value Ref Range Status   MRSA by PCR NEGATIVE NEGATIVE Final    Comment:        The GeneXpert MRSA Assay (FDA approved for NASAL specimens only), is one component of a comprehensive MRSA colonization surveillance program. It is not intended to diagnose MRSA infection nor to guide or monitor treatment for MRSA infections.    Studies/Results: Dg Chest 2 View  02/06/2014   CLINICAL DATA:  80 year old male with confusion, sinus congestion, nausea and vomiting.  EXAM: CHEST  2 VIEW  COMPARISON:  Prior chest  x-ray 08/29/2011  FINDINGS: Cardiac and mediastinal contours remain normal. Right subclavian approach cardiac rhythm maintenance device with leads projecting over the right atrium and right ventricle. Atherosclerotic calcifications present in the transverse aorta. Patchy airspace opacity in the left lower lobe is an interval finding compared to prior. Otherwise, the lungs are clear. No pulmonary edema, pleural effusion, pneumothorax or suspicious pulmonary nodule or mass.  IMPRESSION: 1. Patchy left lower lobe airspace opacity concerning for bronchopneumonia in the appropriate clinical setting. Recommend imaging followup to resolution with repeat chest x-ray in 4- 6 weeks following an appropriate course of therapy. 2. Aortic atherosclerosis.   Electronically Signed   By: Jacqulynn Cadet M.D.   On: 02/06/2014 13:25   Ct Head Wo Contrast  02/06/2014    CLINICAL DATA:  NASAL CONGESTION EMESIS headache AMS. PT. EXPLAINS HE HAS BEEN UNABLE TO REMEMBER THINGS SINCE Wednesday, HX MINI-STROKE, COUGH, SINUS HEADACHE, LW  EXAM: CT HEAD WITHOUT CONTRAST  TECHNIQUE: Contiguous axial images were obtained from the base of the skull through the vertex without intravenous contrast.  COMPARISON:  01/29/2011  FINDINGS: The ventricles and cisterns are within normal. Remaining CSF spaces are unremarkable. There is evidence of chronic ischemic microvascular disease unchanged. Moderate bilateral basal ganglia calcifications are unchanged. There is a small focal region of low-attenuation over the high left frontal gray-white matter likely an old infarct. No definite evidence of acute ischemia. No mass, mass effect or shift of midline structures. No acute hemorrhage. Complete opacification over the left maxillary sinus with opacification over the ethmoid and minimally over the sphenoid sinuses compatible with chronic inflammatory change. Remaining bony structures are unremarkable.  IMPRESSION: No acute intracranial findings.  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  Moderate chronic sinus inflammatory disease predominately involving the left maxillary sinus.   Electronically Signed   By: Marin Olp M.D.   On: 02/06/2014 13:37   Medications: I have reviewed the patient's current medications. Scheduled Meds: . albuterol  5 mg Nebulization Once  . antiseptic oral rinse  7 mL Mouth Rinse BID  . azithromycin  250 mg Oral Daily  . cefTRIAXone (ROCEPHIN)  IV  2 g Intravenous Q24H  . dextromethorphan-guaiFENesin  1 tablet Oral BID  . feeding supplement (ENSURE COMPLETE)  237 mL Oral BID BM  . fish oil-omega-3 fatty acids  1 g Oral Daily  . pravastatin  20 mg Oral QPM  . rivaroxaban  15 mg Oral QHS  . sodium chloride  3 mL Intravenous Q12H   Continuous Infusions:  PRN Meds:.acetaminophen **OR** acetaminophen, albuterol, benzonatate,  ondansetron, phenol Assessment/Plan: Principal Problem:   Community acquired pneumonia Active Problems:   CAD s/p RCA angioplasty 2001   Pacemaker - dual chamber, implanted in 1985, last generator change 2006, [employer]   Underweight   Hyperlipidemia   Paroxysmal atrial fibrillation   Acute renal failure superimposed on stage 3 chronic kidney disease   Sepsis   Hypertension   Elevated fasting glucose  Community Acquired Pneumonia: Patient feeling better today, states coughing has improved. Lungs still with coarse breath sounds, but improved compared to yesterday. Strep pneumo negative, legionella and HIV pending. Influenza was negative. Blood cultures have no growth to date. Will send home with Ceftin 500 mg BID for additional 3 days and Azithromycin 250 mg for additional 3 days.  - Complete last IV dose of Ceftriaxone  - Continue Azithromycin 250 mg PO daily. Will discharge home with Ceftin 500 mg BID for additional 3  days and Azithro 250 mg daily for additional 3 days.  - Patient will have follow up appt with PCP on Feb 19th at 9:30 AM - Mucinex BID  AKI on CKD Stage 3: Cr continues to improve, at 1.44 this AM which is near his baseline. Patient encouraged to drink plenty of fluids.   Elevated Fasting Glucose: HbA1c 7.0 indicating diagnosis of diabetes. Will have patient discuss with PCP at follow up appt.   Paroxysmal AFib: Patient is on Xarelto at home. EKG shows NSR.  - Continue Xarelto 20 mg daily   Underweight, Malnutrition: Patient currently weighs 112 lb, was at 118 lbs 2 years ago. Wife notes patient has a poor appetite. He is thin appearing on exam. HIV pending.  - Recommended for patient to try ensure shakes at home - PT evaluated patient and recommended a cane, but patient does not wish to use one  HTN: Patient currently normotensive with SBP in 100s-130s. He takes Ramipril 10 mg daily at home.  - Hold Ramipril for now. Will hold on discharge and wait for follow up appt.     HLD: Last lipid profile in June 2014 showed Chol 168, Trigly 183, HDL 39, and LDL 92. On Pravastatin 20 mg QHS at home.  - Continue home Pravastatin   CAD s/p RCA angioplasty in 2001, pacemaker: No symptoms of coronary artery disease. Last echo in Sept 2014 shows preserved EF 55-60% and no wall motion abnormalities.  - Hold home Ramipril   Diet: Heart healthy VTE PPx: Xarelto  Dispo: Discharge today   The patient does have a current PCP Cyndi Bender, PA-C) and does need an [facility] hospital follow-up appointment after discharge.  The patient does not have transportation limitations that hinder transportation to clinic appointments.  .Services Needed at time of discharge: Y = Yes, Blank = No PT:   OT:   RN:   Equipment:   Other:     LOS: 2 days   Albin Felling, MD 02/08/2014, 8:55 AM

## 2014-02-08 NOTE — Discharge Summary (Signed)
Name: Thomas Snow MRN: [id number] DOB: October 25, 1934 80 y.o. PCP: Thomas Bender, PA-C  Date of Admission: 02/06/2014 11:24 AM Date of Discharge: 02/08/2014 Attending Physician: Bartholomew Crews, MD  Discharge Diagnosis: Principal Problem: Community Acquired Pneumonia Active Problems: AKI on CKD Stage 3 Type 2 Diabetes Mellitus Paroxysmal Atrial Fibrillation Underweight, Malnutrition HTN Hyperlipidemia CAD s/p RCA Angioplasty in 2001 Pacemaker   Discharge Medications:   Medication List    STOP taking these medications        ramipril 10 MG capsule  Commonly known as:  ALTACE      TAKE these medications        acetaminophen 325 MG tablet  Commonly known as:  TYLENOL  Take 650 mg by mouth 3 (three) times daily.     azithromycin 250 MG tablet  Commonly known as:  ZITHROMAX  Take 1 tablet (250 mg total) by mouth daily.  Start taking on:  02/09/2014     cefUROXime 500 MG tablet  Commonly known as:  CEFTIN  Take 1 tablet (500 mg total) by mouth 2 (two) times daily with a meal.  Start taking on:  02/09/2014     CORICIDIN HBP COLD/FLU PO  Take 2 tablets by mouth.     dextromethorphan-guaiFENesin 30-600 MG per 12 hr tablet  Commonly known as:  MUCINEX DM  Take 1 tablet by mouth 2 (two) times daily.     feeding supplement (ENSURE COMPLETE) Liqd  Take 237 mLs by mouth 2 (two) times daily between meals.     fish oil-omega-3 fatty acids 1000 MG capsule  Take 1 g by mouth daily.     Menthol (Topical Analgesic) 8 % Liqd  Apply 1 application topically 2 (two) times daily as needed.     OVER THE COUNTER MEDICATION  Take by mouth daily. Vitamin B12 - unknown dose.     phenol 1.4 % Liqd  Commonly known as:  CHLORASEPTIC  Use as directed 1 spray in the mouth or throat as needed for throat irritation / pain.     phenylephrine 10 MG Tabs tablet  Commonly known as:  SUDAFED PE  Take 20 mg by mouth every 6 (six) hours as needed.     pravastatin 20 MG tablet    Commonly known as:  PRAVACHOL  Take 1 tablet (20 mg total) by mouth every evening.     rivaroxaban 20 MG Tabs tablet  Commonly known as:  XARELTO  Take 1 tablet (20 mg total) by mouth daily with supper.     sodium chloride 0.65 % Soln nasal spray  Commonly known as:  OCEAN  Place 1 spray into both nostrils as needed for congestion.     triamcinolone cream 0.1 %  Commonly known as:  KENALOG  Apply 1 application topically 2 (two) times daily as needed.        Disposition and follow-up:   Thomas Snow was discharged from [facility] in Good condition.  At the hospital follow up visit please address:  1.  Community Acquired Pneumonia: Patient needs repeat CXR in 4-6 weeks.  AKI on CKD: Pt will need repeat bmet to assess kidney function. His home Ramipril was held and will need to reassess whether this should be restarted. Cr. 1.44, close to baseline of 1.3-1.4 upon discharge. Possibly consider different BP agent.  Diabetes- HbA1c found to be 7.0. Discuss with patient risks/benefits of starting medication for DM. Underweight, Malnutrition:  Discuss with pt about starting protein shakes (  ensure).    2.  Labs / imaging needed at time of follow-up: bmet, lipid profile   3.  Pending labs/ test needing follow-up: Legionella antigen   Follow-up Appointments: Patient has an appt with his PCP Thomas Snow on Feb 19th at 9:30 AM.    Procedures Performed:  Dg Chest 2 View  02/06/2014   CLINICAL DATA:  80 year old male with confusion, sinus congestion, nausea and vomiting.  EXAM: CHEST  2 VIEW  COMPARISON:  Prior chest x-ray 08/29/2011  FINDINGS: Cardiac and mediastinal contours remain normal. Right subclavian approach cardiac rhythm maintenance device with leads projecting over the right atrium and right ventricle. Atherosclerotic calcifications present in the transverse aorta. Patchy airspace opacity in the left lower lobe is an interval finding compared to prior.  Otherwise, the lungs are clear. No pulmonary edema, pleural effusion, pneumothorax or suspicious pulmonary nodule or mass.  IMPRESSION: 1. Patchy left lower lobe airspace opacity concerning for bronchopneumonia in the appropriate clinical setting. Recommend imaging followup to resolution with repeat chest x-ray in 4- 6 weeks following an appropriate course of therapy. 2. Aortic atherosclerosis.   Electronically Signed   By: Thomas Snow M.D.   On: 02/06/2014 13:25   Ct Head Wo Contrast  02/06/2014   CLINICAL DATA:  NASAL CONGESTION EMESIS headache AMS. PT. EXPLAINS HE HAS BEEN UNABLE TO REMEMBER THINGS SINCE Wednesday, HX MINI-STROKE, COUGH, SINUS HEADACHE, LW  EXAM: CT HEAD WITHOUT CONTRAST  TECHNIQUE: Contiguous axial images were obtained from the base of the skull through the vertex without intravenous contrast.  COMPARISON:  01/29/2011  FINDINGS: The ventricles and cisterns are within normal. Remaining CSF spaces are unremarkable. There is evidence of chronic ischemic microvascular disease unchanged. Moderate bilateral basal ganglia calcifications are unchanged. There is a small focal region of low-attenuation over the high left frontal gray-white matter likely an old infarct. No definite evidence of acute ischemia. No mass, mass effect or shift of midline structures. No acute hemorrhage. Complete opacification over the left maxillary sinus with opacification over the ethmoid and minimally over the sphenoid sinuses compatible with chronic inflammatory change. Remaining bony structures are unremarkable.  IMPRESSION: No acute intracranial findings.  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  Moderate chronic sinus inflammatory disease predominately involving the left maxillary sinus.   Electronically Signed   By: Thomas Snow M.D.   On: 02/06/2014 13:37    Admission HPI: Thomas Snow is a 80yo man w/ PMHx of HTN, HLD, CAD s/p RCA angioplasty in 2001, carotid artery  occlusion s/p stent in 2001, and paroxysmal AFib on Xarelto who presented to the ED with cough and congestion. Patient reports over the last 2-3 days he has had significant congestion and a productive cough. He notes his sputum has been green in color and very loose. He reports sinus pain, sore throat, and wheezing. He denies dyspnea, pleuritic chest pain, and fever. His wife notes he had nausea and vomiting x 3 last night and then another episode of emesis while in the hospital. He denies any abdominal pain. His wife also reports he had some confusion last night and this morning. Patient had asked his wife, "Whose house is this?" when he has lived in the same house for the past 28 years. Wife notes his mental status is back to baseline now.   In the ED, patient's lactic acid was 4.36 and CXR showed a LLL opacity. Patient received a 1.5 L bolus and was started on Vanc and  [place] Hospital Course by problem list:  Community Acquired Pneumonia: Patient presented with 2-3 day history of productive cough, congestion, and nausea/vomiting. On admission he was afebrile and BP stable in [id number] systolic, but his WBC count was 20.5, he was tachycardic in 110s, and tachypneic. He had significant rhonchi bilaterally on lung exam, with left side greater than right. On CXR, a left lower lobe opacity was present. His lactic acid on admission was 4.36. He was started on IV Vanc/Zosyn and IVF in the ED. His antibiotics were then changed to IV Ceftriaxone and PO Azithromycin. His lactic acidosis resolved within a few hours after IV fluid resuscitation. Patient's WBC count trended down to 12 and he continued to improve clinically. His strep pneumo antigen and HIV were negative. Legionella antigen still pending. He was discharged on Ceftin 500 mg BID and Azithromycin 250 mg daily for an additional three days (end date 02/11/14). He will need a follow up chest x-ray in 4-6 weeks. He has a follow up appointment scheduled with Thomas Snow, his PCP, on Feb 19th at 9:30 AM.   AKI on CKD Stage 3: Cr on admission was 2.03 and patient appeared dry on exam. His Cr continued to improve from 2.03>1.63>1.44 after IV fluids. His home Ramipril was also held due to his AKI. His AKI was attributed to volume depletion. He will need a repeat bmet at his follow up appointment. Patient was instructed to hold his home Ramipril until seen by his PCP.   Type 2 Diabetes Mellitus: On admission, patient's glucose was found to be 348. Patient denied any history of diabetes, but stated he was told he had "prediabetes." A HbA1c was checked and found to be 7.0, which is consistent with diabetes mellitus. At his PCP follow up, the risks and benefits of starting diabetes medication should be reviewed.   Paroxysmal Atrial Fibrillation: Patient takes Xarelto 20 mg daily at home. He was continued on his home Xarelto.   Underweight, Malnutrition: Patient currently weighs 112 lbs, was previously at 118 lbs two years ago. His wife had noted that he has had a poor appetite. HIV was checked and was negative. Patient was started on Ensure shakes during his hospitalization and discharged home with a prescription to continue taking Ensure. His decreased appetite and weight will be addressed at his PCP follow up.   HTN: BP remained in [id number] systolic. His home Ramipril was held due to AKI (see above). Patient was instructed to hold his home Ramipril until his outpatient follow up appointment. At his follow up visit, restarting Ramipril and whether a different blood pressure medication is indicated should be assessed.   Hyperlipidemia: Last lipid profile in June 2014 showed Chol 168, Trigly 183, HDL 39, and LDL 92. He is pn Pravastatin 20 mg QHS at home. He was continued on his home Pravastatin. Patient should have his lipid profile repeated at his follow up visit if not done in the last year.   CAD s/p RCA Angioplasty in 2001, Pacemaker: Patient had no symptoms of  coronary artery disease during his hospitalization. His last echo in Sept 2014 showed a preserved EF 55-60% and no wall motion abnormalities.   Discharge Vitals:   BP 165/65 mmHg  Pulse 85  Temp(Src) 98.7 F (37.1 C) (Oral)  Resp 20  Ht 5\' 5"  (1.651 m)  Wt 111 lb 3.2 oz (50.44 kg)  BMI 18.50 kg/m2  SpO2 96% Physical Exam General: thin elderly man sitting up in bed, pleasant, NAD  HEENT: [place]/AT, EOMI, mucus membranes moist Neck: supple, no lymphadenopathy CV: RRR, 2/6 systolic murmur Pulm: coarse breath sounds bilaterally, L > R, improved compared to yesterday Abd: BS+, soft, non-tender Ext: warm, no edema, moves all Neuro: alert and oriented x 3, no focal deficits, strength intact  Discharge Labs:  Results for orders placed or performed during the hospital encounter of 02/06/14 (from the past 24 hour(s))  Basic metabolic panel     Status: Abnormal   Collection Time: 02/08/14  8:35 AM  Result Value Ref Range   Sodium 139 135 - 145 mmol/L   Potassium 3.7 3.5 - 5.1 mmol/L   Chloride 108 96 - 112 mmol/L   CO2 24 19 - 32 mmol/L   Glucose, Bld 173 (H) 70 - 99 mg/dL   BUN 18 6 - 23 mg/dL   Creatinine, Ser 1.44 (H) 0.50 - 1.35 mg/dL   Calcium 8.2 (L) 8.4 - 10.5 mg/dL   GFR calc non Af Amer 45 (L) >90 mL/min   GFR calc Af Amer 53 (L) >90 mL/min   Anion gap 7 5 - 15  CBC     Status: Abnormal   Collection Time: 02/08/14  8:35 AM  Result Value Ref Range   WBC 12.3 (H) 4.0 - 10.5 K/uL   RBC 4.18 (L) 4.22 - 5.81 MIL/uL   Hemoglobin 12.3 (L) 13.0 - 17.0 g/dL   HCT 36.6 (L) 39.0 - 52.0 %   MCV 87.6 78.0 - 100.0 fL   MCH 29.4 26.0 - 34.0 pg   MCHC 33.6 30.0 - 36.0 g/dL   RDW 13.6 11.5 - 15.5 %   Platelets 238 150 - 400 K/uL  Procalcitonin     Status: None   Collection Time: 02/08/14  8:35 AM  Result Value Ref Range   Procalcitonin 1.48 ng/mL    Signed: Albin Felling, MD 02/08/2014, 11:46 AM    Services Ordered on Discharge: None Equipment Ordered on Discharge: None

## 2014-02-08 NOTE — Progress Notes (Signed)
Pt. Received discharge instructions and prescriptions. Educated pt. On follow-up appointments. Removed IV. No skin issues noted. All questions answered. No further needs noted at this time.

## 2014-02-08 NOTE — Discharge Instructions (Signed)
It was a pleasure taking care of you, Mr. Countess.  Please start taking your antibiotics tomorrow. You will need to take Ceftin 500 mg twice a day and Azithromycin 250 mg once daily for the next 3 days.   You have a follow up appointment scheduled with Cyndi Bender on Feb 19th at 9:30 AM. Please be sure to make it to this appointment. You will need to have a repeat chest x-ray in 4-6 weeks. I will let your doctor know this as well.   Also, please do not take your blood pressure medication, Ramipril until you see your primary care doctor.   Take care, Dr. Arcelia Jew

## 2014-02-12 LAB — CULTURE, BLOOD (ROUTINE X 2): Culture: NO GROWTH

## 2014-02-14 LAB — CULTURE, BLOOD (ROUTINE X 2): Culture: NO GROWTH

## 2014-03-19 ENCOUNTER — Other Ambulatory Visit: Payer: Self-pay | Admitting: Physician Assistant

## 2014-03-19 ENCOUNTER — Ambulatory Visit
Admission: RE | Admit: 2014-03-19 | Discharge: 2014-03-19 | Disposition: A | Discharge status: Home / Self Care | Payer: PPO | Source: Ambulatory Visit | Attending: Physician Assistant | Admitting: Physician Assistant

## 2014-03-19 DIAGNOSIS — Z09 Encounter for follow-up examination after completed treatment for conditions other than malignant neoplasm: Secondary | ICD-10-CM

## 2014-05-04 ENCOUNTER — Encounter: Payer: Self-pay | Admitting: *Deleted

## 2014-06-04 ENCOUNTER — Encounter: Payer: Self-pay | Admitting: *Deleted

## 2014-06-04 ENCOUNTER — Other Ambulatory Visit: Payer: Self-pay | Admitting: Cardiovascular Disease

## 2014-06-04 NOTE — Telephone Encounter (Signed)
Rx has been sent to the pharmacy electronically. ° °

## 2014-06-29 ENCOUNTER — Encounter: Payer: Self-pay | Admitting: *Deleted

## 2014-07-09 ENCOUNTER — Emergency Department (HOSPITAL_COMMUNITY)
Admission: EM | Admit: 2014-07-09 | Discharge: 2014-07-09 | Disposition: A | Discharge status: Home / Self Care | Payer: PPO | Attending: Emergency Medicine | Admitting: Emergency Medicine

## 2014-07-09 ENCOUNTER — Encounter (HOSPITAL_COMMUNITY): Payer: Self-pay | Admitting: *Deleted

## 2014-07-09 ENCOUNTER — Emergency Department (HOSPITAL_COMMUNITY): Payer: PPO

## 2014-07-09 DIAGNOSIS — Z87891 Personal history of nicotine dependence: Secondary | ICD-10-CM | POA: Diagnosis not present

## 2014-07-09 DIAGNOSIS — Z8701 Personal history of pneumonia (recurrent): Secondary | ICD-10-CM | POA: Diagnosis not present

## 2014-07-09 DIAGNOSIS — I2510 Atherosclerotic heart disease of native coronary artery without angina pectoris: Secondary | ICD-10-CM | POA: Insufficient documentation

## 2014-07-09 DIAGNOSIS — E785 Hyperlipidemia, unspecified: Secondary | ICD-10-CM | POA: Diagnosis not present

## 2014-07-09 DIAGNOSIS — M545 Low back pain: Secondary | ICD-10-CM | POA: Insufficient documentation

## 2014-07-09 DIAGNOSIS — Z79899 Other long term (current) drug therapy: Secondary | ICD-10-CM | POA: Insufficient documentation

## 2014-07-09 DIAGNOSIS — M160 Bilateral primary osteoarthritis of hip: Secondary | ICD-10-CM | POA: Insufficient documentation

## 2014-07-09 DIAGNOSIS — Z950 Presence of cardiac pacemaker: Secondary | ICD-10-CM | POA: Diagnosis not present

## 2014-07-09 DIAGNOSIS — Z9889 Other specified postprocedural states: Secondary | ICD-10-CM | POA: Insufficient documentation

## 2014-07-09 DIAGNOSIS — M25551 Pain in right hip: Secondary | ICD-10-CM | POA: Diagnosis present

## 2014-07-09 DIAGNOSIS — Z9861 Coronary angioplasty status: Secondary | ICD-10-CM | POA: Diagnosis not present

## 2014-07-09 DIAGNOSIS — Z792 Long term (current) use of antibiotics: Secondary | ICD-10-CM | POA: Diagnosis not present

## 2014-07-09 DIAGNOSIS — R52 Pain, unspecified: Secondary | ICD-10-CM

## 2014-07-09 DIAGNOSIS — I10 Essential (primary) hypertension: Secondary | ICD-10-CM | POA: Insufficient documentation

## 2014-07-09 MED ORDER — OXYCODONE-ACETAMINOPHEN 5-325 MG PO TABS
ORAL_TABLET | ORAL | Status: AC
  Filled 2014-07-09: qty 1

## 2014-07-09 MED ORDER — HYDROCODONE-ACETAMINOPHEN 5-325 MG PO TABS: 1.0000 | ORAL_TABLET | Freq: Four times a day (QID) | ORAL | Status: DC | PRN

## 2014-07-09 MED ORDER — OXYCODONE-ACETAMINOPHEN 5-325 MG PO TABS
1.0000 | ORAL_TABLET | Freq: Once | ORAL | Status: AC
  Administered 2014-07-09: 1 via ORAL

## 2014-07-09 NOTE — ED Provider Notes (Addendum)
CSN: [id number]     Arrival date & time 07/09/14  1311 History   First MD Initiated Contact with Patient 07/09/14 1355     Chief Complaint  Patient presents with  . Hip Pain     (Consider location/radiation/quality/duration/timing/severity/associated sxs/prior Treatment) HPI Comments: Patient has had ongoing right-sided back and hip pain for years however it is much worse this morning. Patient does admit to mowing for 4 hours on a riding lawnmower yesterday which is probably what is making his pain worse today. It is worse with certain positions but he denies any weakness in the leg. No swelling or redness. He has been using Tylenol, muscle rubs and heating pad with some improvement. He takes no prescription pain medication. He denies any recent trauma. He had back surgery approximately 25 years ago but denies any recent instrumentation.  Patient is a 80 y.o. male presenting with hip pain. The history is provided by the patient.  Hip Pain This is a recurrent problem. The current episode started 6 to 12 hours ago. The problem occurs constantly. The problem has not changed since onset.   Past Medical History  Diagnosis Date  . Hyperlipidemia   . Hypertension   . Pneumonia     as a child  . Pacemaker   . Arthritis   . Coronary artery disease     prox and mid RCA atherectomy '01  . Carotid artery occlusion     left SCA stent '01, carotid occlusive dz   Past Surgical History  Procedure Laterality Date  . Kidney stone surgery    . Cardiac catheterization    . Coronary angioplasty    . Kidney stent      left RAS '05  . Tonsillectomy    . Back surgery      approx 20 years ago  . Eye surgery      bilateral cataract removal  . Knee surgery      right knee  . Cardiac pacemaker placement      [place] '85, '95; [place] '06  . Endarterectomy  09/05/2011    Procedure: ENDARTERECTOMY CAROTID;  Surgeon: Mal Misty, MD;  Location: [place];  Service: Vascular;  Laterality: Right;    . Carotid endarterectomy Right 09-05-11    cea  . Peripheral vascular angiogram  01/22/2003    Left subclavian 85% fairly focal in-stent restenosis, dilated with a 7x2cm Powerflex balloon at 10-30 and 10-30, resulting in less than 10% residual narrowing. Left renal artery demonstrated 80-85% initially dilated with a 4x15 mm coronary cutting balloon at 10-30 and 12-30, exchanged for a 19mmx2cm Aviator balloon inflations were done at 6-15, 10-30, and 10-30, resulting less than 10% residual.  . Peripheral vascular angiogram  07/13/1999    Left subclavian 85% dilated with a 53mmx2cm Cordis Power-Flex balloon at 6-40, a P-204 iliac stent was hand-crimped on a 2mmx2cm Cordis Power-Flex balloon, expanded at 8-40 with reduction to 0%. Left Renal artery 75-80% stenosis, dilated with a 74mmx1.5cm Cordis Power-Flex balloon at 6-40, reduction of stenosis from 80% to 0%.  . Renal doppler  09/25/2012    Celiac artery and SMA-demonstrates narrowing with increased velocities consistent with greater than 50%, right renal- 1-59% diameter reduction, left renal artery stent- 1-59% diameter reduction.  . Cardiac catheterization  03/09/1999    RCA high-grade 95% stenosis, inflated with a 3.25 IVT cutting balloon at 6-64, 6-41, 6-52, and 6-53, resulting in reductiong of less than 10%. RCA proximal 75% stenosis inflated with a 2.75x10  IVT cutting balloon at 6-65 then exchanged for a 3x10 IVT cutting balloon inflated at 6-83.  . Cardiovascular stress test  08/09/2010    Normal pattern of perfusion in all regions. No ECG changes. EKG negative for ischemia.  . Transthoracic echocardiogram  09-25-2012    EF 55-60%, mild-moderate tricuspid valve regurg  . Icd generator change  10/20/2004    Implantation of [employer] [facility] [place], model # 5357M/s, serial # [id number]   Family History  Problem Relation Age of Onset  . Arthritis Father    History  Substance Use Topics  . Smoking status: Former Smoker -- 0.50 packs/day for 25  years    Types: Cigarettes    Quit date: 08/19/1996  . Smokeless tobacco: Current User    Types: Snuff  . Alcohol Use: No    Review of Systems  Constitutional: Negative for fever.  All other systems reviewed and are negative.     Allergies  Atorvastatin and Crestor  Home Medications   Prior to Admission medications   Medication Sig Start Date End Date Taking? Authorizing Provider  acetaminophen (TYLENOL) 325 MG tablet Take 650 mg by mouth 3 (three) times daily.    Historical Provider, MD  azithromycin (ZITHROMAX) 250 MG tablet Take 1 tablet (250 mg total) by mouth daily. 02/09/14   Juliet Rude, MD  cefUROXime (CEFTIN) 500 MG tablet Take 1 tablet (500 mg total) by mouth 2 (two) times daily with a meal. 02/09/14   Juliet Rude, MD  Chlorpheniramine-Acetaminophen (CORICIDIN HBP COLD/FLU PO) Take 2 tablets by mouth.    Historical Provider, MD  dextromethorphan-guaiFENesin (MUCINEX DM) 30-600 MG per 12 hr tablet Take 1 tablet by mouth 2 (two) times daily. 02/08/14   Juliet Rude, MD  feeding supplement, ENSURE COMPLETE, (ENSURE COMPLETE) LIQD Take 237 mLs by mouth 2 (two) times daily between meals. 02/08/14   Juliet Rude, MD  fish oil-omega-3 fatty acids 1000 MG capsule Take 1 g by mouth daily.    Historical Provider, MD  Menthol, Topical Analgesic, 8 % LIQD Apply 1 application topically 2 (two) times daily as needed.    Historical Provider, MD  OVER THE COUNTER MEDICATION Take by mouth daily. Vitamin B12 - unknown dose.    Historical Provider, MD  phenol (CHLORASEPTIC) 1.4 % LIQD Use as directed 1 spray in the mouth or throat as needed for throat irritation / pain. 02/08/14   Carly Montey Hora, MD  phenylephrine (SUDAFED PE) 10 MG TABS tablet Take 20 mg by mouth every 6 (six) hours as needed.    Historical Provider, MD  pravastatin (PRAVACHOL) 20 MG tablet Take 1 tablet (20 mg total) by mouth every evening. Needs appointment before anymore refills 06/04/14   Sanda Klein, MD  rivaroxaban  (XARELTO) 20 MG TABS tablet Take 1 tablet (20 mg total) by mouth daily with supper. Patient taking differently: Take 20 mg by mouth at bedtime.  11/23/13   Mihai Croitoru, MD  sodium chloride (OCEAN) 0.65 % SOLN nasal spray Place 1 spray into both nostrils as needed for congestion.    Historical Provider, MD  triamcinolone cream (KENALOG) 0.1 % Apply 1 application topically 2 (two) times daily as needed.  09/02/13   Historical Provider, MD   BP 133/49 mmHg  Pulse 60  Temp(Src) 97.6 F (36.4 C) (Oral)  Resp 18  SpO2 100% Physical Exam  Constitutional: He is oriented to person, place, and time. He appears well-developed and well-nourished. No distress.  HENT:  Head: Normocephalic and atraumatic.  Mouth/Throat: Oropharynx is clear and moist.  Eyes: Conjunctivae and EOM are normal. Pupils are equal, round, and reactive to light.  Neck: Normal range of motion. Neck supple.  Cardiovascular: Normal rate, regular rhythm and intact distal pulses.   No murmur heard. Pulmonary/Chest: Effort normal and breath sounds normal. No respiratory distress. He has no wheezes. He has no rales.  Musculoskeletal: Normal range of motion. He exhibits tenderness. He exhibits no edema.       Right hip: He exhibits tenderness. He exhibits normal range of motion, no bony tenderness, no swelling and no deformity.       Lumbar back: He exhibits tenderness.       Back:  Tenderness in the right SI joint and in the lateral hip with palpation  Neurological: He is alert and oriented to person, place, and time. He has normal strength. No sensory deficit.  5 out of 5 strength in bilateral lower extremities  Skin: Skin is warm and dry. No rash noted. No erythema.  Psychiatric: He has a normal mood and affect. His behavior is normal.  Nursing note and vitals reviewed.   ED Course  Procedures (including critical care time) Labs Review Labs Reviewed - No data to display  Imaging Review Dg Hip Unilat With Pelvis 2-3 Views  Right  07/09/2014   CLINICAL DATA:  Right hip pain.  Initial evaluation  EXAM: DG HIP (WITH OR WITHOUT PELVIS) 2-3V RIGHT  COMPARISON:  None.  FINDINGS: Degenerative changes lumbar spine and both hips. No acute bony abnormality. No evidence of fracture or dislocation.  IMPRESSION: No acute abnormality .   Electronically Signed   By: Marcello Moores  Register   On: 07/09/2014 15:11     EKG Interpretation None      MDM   Final diagnoses:  Pain  Right hip pain  Osteoarthritis of both hips, unspecified osteoarthritis type   patient presenting with right hip pain which is worse today after sitting on a lawnmower for 4 hours yesterday millimeter hard. Patient has had ongoing right back and hip pain for some time but worse today. He takes Tylenol, uses muscle rehabilitation's and heating pad with some improvement but the pain persists today. He denies any radiation down to the toes, weakness or bowel or bladder issues. He denies any recent trauma.  No evidence of zoster, pain with palpation to the SI joint in the right hip.  Low suspicion for septic joint at this time. No evidence of cellulitis.  Patient given a Percocet in the waiting room. Plain imaging of the hip pending  3:25 PM Imaging showing arthritis otherwise no acute findings. Patient's pain is improved after Percocet. Discharge home with Vicodin to use when necessary and follow-up with orthopedist.  Blanchie Dessert, MD 07/09/14 [place], MD 07/09/14 1527

## 2014-07-09 NOTE — ED Notes (Signed)
Patient transported to X-ray 

## 2014-07-09 NOTE — ED Notes (Signed)
Pt reports falling approx one year ago and still has occ pain to back and hip. Reports it was more severe this am when he woke up, denies new injury. Pain is to right hip and lower back. Ambulatory at triage.

## 2014-08-10 ENCOUNTER — Ambulatory Visit (INDEPENDENT_AMBULATORY_CARE_PROVIDER_SITE_OTHER): Payer: PPO | Admitting: Cardiovascular Disease

## 2014-08-10 ENCOUNTER — Encounter: Payer: Self-pay | Admitting: Cardiovascular Disease

## 2014-08-10 VITALS — BP 156/64 | HR 71 | Resp 16 | Ht 64.0 in | Wt 100.9 lb

## 2014-08-10 DIAGNOSIS — I480 Paroxysmal atrial fibrillation: Secondary | ICD-10-CM | POA: Diagnosis not present

## 2014-08-10 DIAGNOSIS — R636 Underweight: Secondary | ICD-10-CM

## 2014-08-10 DIAGNOSIS — I10 Essential (primary) hypertension: Secondary | ICD-10-CM | POA: Diagnosis not present

## 2014-08-10 DIAGNOSIS — I771 Stricture of artery: Secondary | ICD-10-CM

## 2014-08-10 DIAGNOSIS — I2510 Atherosclerotic heart disease of native coronary artery without angina pectoris: Secondary | ICD-10-CM | POA: Diagnosis not present

## 2014-08-10 DIAGNOSIS — I455 Other specified heart block: Secondary | ICD-10-CM | POA: Diagnosis not present

## 2014-08-10 DIAGNOSIS — E785 Hyperlipidemia, unspecified: Secondary | ICD-10-CM

## 2014-08-10 DIAGNOSIS — Z950 Presence of cardiac pacemaker: Secondary | ICD-10-CM

## 2014-08-10 NOTE — Progress Notes (Signed)
Patient ID: Thomas Snow, male   DOB: 08/19/1934, 80 y.o.   MRN: [id number]     Cardiology Office Note   Date:  08/12/2014   ID:  Thomas Snow, DOB September 10, 1934, MRN [id number]  PCP:  Fae Pippin  Cardiologist:   Sanda Klein, MD   Chief Complaint  Patient presents with  . Follow-up    No complaints of chest pain, SOB, edema or dizziness.  Having hip and back problems treated by ortho      History of Present Illness: Thomas Snow is a 80 y.o. male who presents for  Follow-up on his pacemaker. He has no cardiac complaints but is continuing to lose weight at an alarming rate. He now weighs only 100 pounds and his BMI is very low at 17.  He took an additional turn for the worse after prolonged hospitalization for pneumonia.   He does not have intestinal angina, diarrhea or other signs of malabsorption. His appetite is  very poor.   pacemaker interrogation shows normal function. He has a [employer]. Jude very T device that was implanted in 2006 and still has roughly 3-8 years of estimated generator longevity. His unipolar leads were implanted in 1985 and still have excellent parameters. He only has 8% atrial pacing and virtually no ventricular pacing. He has numerous episodes of mode switch ( more than 250) but they are all very brief and many if not all likely represent "noise" on his unipolar atrial lead.  However, he has a documented history of paroxysmal atrial fibrillation in the past. He has not had any bleeding problems while taking Xarelto. He has not had a stroke , TIA or other neurological complaints or embolic phenomena.  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 He has not had any syncope since implantation of a pacemaker in 1985. He still has the original unipolar atrial and ventricular leads, but has undergone numerous generator change outs, most recently in 2006 when he received a St[employer] Jude dual-chamber device. His pacemaker has  recorded repeated episodes of paroxysmal atrial fibrillation which is asymptomatic. He is on anticoagulation therapy.   He has subsequently developed numerous vascular problems involving multiple arterial territories. He had Cutting Balloon angioplasty for a right coronary artery stenosis in 2001, without stent placement. Ischemia was detected by nuclear testing and he never had angina or dyspnea. He remains asymptomatic and had a low-risk nuclear study in 2012. He has preserved left ventricular systolic function. He received a stent to the left subclavian artery for subclavian steal syndrome in 2001. He is now asymptomatic from the standpoint and his most recent ultrasound shows moderate bilateral subclavian artery stenosis. He received a stent to the left renal artery in 2005. His most recent ultrasound shows mild bilateral renal artery stenosis, less than 60%. He had a transient ischemic attack in 2013 and underwent a right carotid endarterectomy. 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 I'm not sure that there is a clear-cut cause or relationship. He does not have symptoms of intestinal angina or malabsorption.   Past Medical History  Diagnosis Date  . Hyperlipidemia   . Hypertension   . Pneumonia     as a child  . Pacemaker   . Arthritis   . Coronary artery disease     prox and mid RCA atherectomy '01  . Carotid artery occlusion     left SCA  stent '01, carotid occlusive dz    Past Surgical History  Procedure Laterality Date  . Kidney stone surgery    . Cardiac catheterization    . Coronary angioplasty    . Kidney stent      left RAS '05  . Tonsillectomy    . Back surgery      approx 20 years ago  . Eye surgery      bilateral cataract removal  . Knee surgery      right knee  . Cardiac pacemaker placement      [facility] '85, '95; [facility] '06  .  Endarterectomy  09/05/2011    Procedure: ENDARTERECTOMY CAROTID;  Surgeon: Mal Misty, MD;  Location: [facility];  Service: Vascular;  Laterality: Right;  . Carotid endarterectomy Right 09-05-11    cea  . Peripheral vascular angiogram  01/22/2003    Left subclavian 85% fairly focal in-stent restenosis, dilated with a 7x2cm Powerflex balloon at 10-30 and 10-30, resulting in less than 10% residual narrowing. Left renal artery demonstrated 80-85% initially dilated with a 4x15 mm coronary cutting balloon at 10-30 and 12-30, exchanged for a 79mmx2cm Aviator balloon inflations were done at 6-15, 10-30, and 10-30, resulting less than 10% residual.  . Peripheral vascular angiogram  07/13/1999    Left subclavian 85% dilated with a 24mmx2cm Cordis Power-Flex balloon at 6-40, a P-204 iliac stent was hand-crimped on a 21mmx2cm Cordis Power-Flex balloon, expanded at 8-40 with reduction to 0%. Left Renal artery 75-80% stenosis, dilated with a 62mmx1.5cm Cordis Power-Flex balloon at 6-40, reduction of stenosis from 80% to 0%.  . Renal doppler  09/25/2012    Celiac artery and SMA-demonstrates narrowing with increased velocities consistent with greater than 50%, right renal- 1-59% diameter reduction, left renal artery stent- 1-59% diameter reduction.  . Cardiac catheterization  03/09/1999    RCA high-grade 95% stenosis, inflated with a 3.25 IVT cutting balloon at 6-64, 6-41, 6-52, and 6-53, resulting in reductiong of less than 10%. RCA proximal 75% stenosis inflated with a 2.75x10 IVT cutting balloon at 6-65 then exchanged for a 3x10 IVT cutting balloon inflated at 6-83.  . Cardiovascular stress test  08/09/2010    Normal pattern of perfusion in all regions. No ECG changes. EKG negative for ischemia.  . Transthoracic echocardiogram  09-25-2012    EF 55-60%, mild-moderate tricuspid valve regurg  . Icd generator change  10/20/2004    Implantation of [facility] Dora, model # 5357M/s, serial # [id number]     Current  Outpatient Prescriptions  Medication Sig Dispense Refill  . acetaminophen (TYLENOL) 325 MG tablet Take 650 mg by mouth 3 (three) times daily.    . cefUROXime (CEFTIN) 500 MG tablet Take 1 tablet (500 mg total) by mouth 2 (two) times daily with a meal. 6 tablet 0  . fish oil-omega-3 fatty acids 1000 MG capsule Take 1 g by mouth daily.    Marland Kitchen HYDROcodone-acetaminophen (NORCO/VICODIN) 5-325 MG per tablet Take 1 tablet by mouth every 6 (six) hours as needed. 15 tablet 0  . OVER THE COUNTER MEDICATION Take by mouth daily. Vitamin B12 - unknown dose.    . phenol (CHLORASEPTIC) 1.4 % LIQD Use as directed 1 spray in the mouth or throat as needed for throat irritation / pain. 1 Bottle 0  . phenylephrine (SUDAFED PE) 10 MG TABS tablet Take 20 mg by mouth every 6 (six) hours as needed.    . rivaroxaban (XARELTO) 20 MG TABS tablet Take 1 tablet (  20 mg total) by mouth daily with supper. (Patient taking differently: Take 20 mg by mouth at bedtime. ) 30 tablet 11  . sodium chloride (OCEAN) 0.65 % SOLN nasal spray Place 1 spray into both nostrils as needed for congestion.     No current facility-administered medications for this visit.    Allergies:   Atorvastatin and Crestor    Social History:  The patient  reports that he quit smoking about 17 years ago. His smoking use included Cigarettes. He has a 12.5 pack-year smoking history. His smokeless tobacco use includes Snuff. He reports that he does not drink alcohol or use illicit drugs.   Family History:  The patient's family history includes Arthritis in his father.    ROS:  Please see the history of present illness.    Otherwise, review of systems positive for  Weight loss, poor appetite.   All other systems are reviewed and negative.    PHYSICAL EXAM: VS:  BP 156/64 mmHg  Pulse 71  Resp 16  Ht 5\' 4"  (1.626 m)  Wt 100 lb 14.4 oz (45.768 kg)  BMI 17.31 kg/m2 , BMI Body mass index is 17.31 kg/(m^2).  blood pressures are equal in the right and left  upper extremities General: Alert, oriented x3, no distress,  Appears very thin and frail Head: no evidence of trauma, PERRL, EOMI, no exophtalmos or lid lag, no myxedema, no xanthelasma; normal ears, nose and oropharynx  Neck: normal jugular venous pulsations and no hepatojugular reflux; brisk carotid pulses without delay; scar of right carotid endarterectomy; loud left carotid bruit  Chest: clear to auscultation, no signs of consolidation by percussion or palpation, normal fremitus, symmetrical and full respiratory excursions. The right subclavian pacemaker site is well-healed. There are very obvious large tortuous venous collaterals overlying the right shoulder and right upper chest, including right over his pacemaker.  Cardiovascular: normal position and quality of the apical impulse, regular rhythm, normal first and second heart sounds, no murmurs, rubs or gallops  Abdomen: no tenderness or distention, no masses by palpation, no abnormal pulsatility ; there are loud systolic bruits heard immediately above the umbilicus and in the left flank;, normal bowel sounds, no hepatosplenomegaly  Extremities: no clubbing, cyanosis or edema; 2+ radial, ulnar and brachial pulses bilaterally; 2+ right femoral, posterior tibial and dorsalis pedis pulses; 2+ left femoral, posterior tibial and dorsalis pedis pulses; he has loud bilateral subclavian and bilateral femoral bruits  Neurological: grossly nonfocal  Psych: euthymic mood, full affect   EKG:  EKG is ordered today. The ekg ordered today demonstrates  Almost sinus rhythm, no repolarization abnormalities   Recent Labs: 02/06/2014: ALT 14 02/08/2014: BUN 18; Creatinine, Ser 1.44*; Hemoglobin 12.3*; Platelets 238; Potassium 3.7; Sodium 139    Lipid Panel    Component Value Date/Time   CHOL 168 06/18/2012 1043   TRIG 183* 06/18/2012 1043   HDL 39* 06/18/2012 1043   CHOLHDL 4.3 06/18/2012 1043   VLDL 37 06/18/2012 1043   LDLCALC 92 06/18/2012 1043       Wt Readings from Last 3 Encounters:  08/10/14 100 lb 14.4 oz (45.768 kg)  02/08/14 111 lb 3.2 oz (50.44 kg)  10/12/13 109 lb 14.4 oz (49.85 kg)   ASSESSMENT AND PLAN:  Subclavian arterial stenosis, bilateral  He received a stent to the left subclavian artery in 2001. By the most recent ultrasound performed in 2013 there were bilateral 50-70% lesions. No subclavian steal syndrome or upper extremity claudication. Equal blood pressures in his right  and left arms.  Reevaluate should he develop symptoms.   Left renal artery stent 2005  Roughly 10 years status post placement of his stent. He has mildly abnormal renal function and has excellent blood pressure. Most recent duplex ultrasound was performed in September of 2014 and did not show significant stenoses, although there was slight increase in bilateral renal artery velocities (renal-aortic ratio roughly 2.2:1).   CAD s/p RCA angioplasty 2001  No symptoms of coronary disease. Remote Cutting Balloon angioplasty without stent placement to the right coronary 2001. Normal perfusion by nuclear study in 2012. Normal left ventricle systolic function. No symptoms of heart failure.   Underweight  He has a poor appetite which explains his weight loss, rather than classic abdominal pain and malabsorption related diarrhea. 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 He does not describe intestinal angina but one wonders whether this could be the cause of his weight loss.  Possibly could benefit from SMA stent.  Consider Megace or Marinol.  Pacemaker - dual chamber, implanted in 1985, last generator change 2006, St. 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 He has had the same pacemaker leads in place since 1985. He has slightly elevated pacing thresholds  on the right ventricular lead but paces the ventricle very rarely. No reprogramming changes performed today.  His physical exam suggests collateral venous circulation due to suspected occlusion of the right subclavian vein. If lead replacement is necessary he will require an entirely new system on the opposite side. For now device function is normal. Unfortunately his pacemaker is not amenable to remote monitoring. We'll see him in the clinic every 6 months.   Hyperlipidemia  Acceptable lipid levels on statin therapy. Especially in view of his dramatic weight loss I do not think we should add a second agent for his mild hypertriglyceridemia. Recheck lipid profile. May even consider stopping his statin if his lipid parameters are low.   Paroxysmal atrial fibrillation  Episodes of atrial fibrillation are asymptomatic and relatively brief. Ventricular rate control is acceptable during atrial fibrillation. Continue anticoagulation.  Switching from per Pradaxa to Xarelto did not help his weight loss.    Current medicines are reviewed at length with the patient today.  The patient does not have concerns regarding medicines.  The following changes have been made:  no change  Labs/ tests ordered today include:  Orders Placed This Encounter  Procedures  . Implantable device check  . EKG 12-Lead    Patient Instructions  Dr. Sallyanne Kuster recommends that you schedule a follow-up appointment in: 6 months with pacemaker check (ST Jude).       Mikael Spray, MD  08/12/2014 1:25 PM    Sanda Klein, MD, [facility] [phone] office [phone] pager

## 2014-08-10 NOTE — Patient Instructions (Signed)
Dr. Sallyanne Kuster recommends that you schedule a follow-up appointment in: 6 months with pacemaker check (ST Jude).

## 2014-08-11 LAB — CUP PACEART INCLINIC DEVICE CHECK
Battery Voltage: 2.78 V
Date Time Interrogation Session: 20160809140400
Lead Channel Pacing Threshold Amplitude: 1.25 V
Lead Channel Pacing Threshold Pulse Width: 0.6 ms
Lead Channel Sensing Intrinsic Amplitude: 1.3 mV
Lead Channel Sensing Intrinsic Amplitude: 5.7 mV
Lead Channel Setting Pacing Amplitude: 2.5 V
Lead Channel Setting Pacing Pulse Width: 0.6 ms
MDC IDC MSMT BATTERY IMPEDANCE: 1500 Ohm
MDC IDC MSMT LEADCHNL RA IMPEDANCE VALUE: 424 Ohm
MDC IDC MSMT LEADCHNL RV IMPEDANCE VALUE: 473 Ohm
MDC IDC MSMT LEADCHNL RV PACING THRESHOLD AMPLITUDE: 1.25 V
MDC IDC MSMT LEADCHNL RV PACING THRESHOLD PULSEWIDTH: 0.6 ms
MDC IDC SET LEADCHNL RA PACING AMPLITUDE: 2.5 V
MDC IDC SET LEADCHNL RV SENSING SENSITIVITY: 2.5 mV
Pulse Gen Serial Number: 1598850

## 2014-08-12 ENCOUNTER — Encounter: Payer: Self-pay | Admitting: Cardiovascular Disease

## 2014-08-25 ENCOUNTER — Encounter: Payer: Self-pay | Admitting: Cardiovascular Disease

## 2014-10-12 ENCOUNTER — Encounter (HOSPITAL_COMMUNITY): Payer: PPO

## 2014-10-12 ENCOUNTER — Ambulatory Visit: Payer: Commercial Managed Care - HMO | Admitting: Family

## 2015-01-18 DIAGNOSIS — J019 Acute sinusitis, unspecified: Secondary | ICD-10-CM | POA: Diagnosis not present

## 2015-01-18 DIAGNOSIS — Z681 Body mass index (BMI) 19 or less, adult: Secondary | ICD-10-CM | POA: Diagnosis not present

## 2015-02-01 ENCOUNTER — Encounter: Payer: Self-pay | Admitting: Cardiovascular Disease

## 2015-02-01 ENCOUNTER — Ambulatory Visit (INDEPENDENT_AMBULATORY_CARE_PROVIDER_SITE_OTHER): Payer: PPO | Admitting: Cardiovascular Disease

## 2015-02-01 VITALS — BP 130/66 | HR 60 | Ht 66.0 in | Wt 108.1 lb

## 2015-02-01 DIAGNOSIS — R636 Underweight: Secondary | ICD-10-CM

## 2015-02-01 DIAGNOSIS — I2510 Atherosclerotic heart disease of native coronary artery without angina pectoris: Secondary | ICD-10-CM

## 2015-02-01 DIAGNOSIS — I480 Paroxysmal atrial fibrillation: Secondary | ICD-10-CM

## 2015-02-01 DIAGNOSIS — E785 Hyperlipidemia, unspecified: Secondary | ICD-10-CM

## 2015-02-01 DIAGNOSIS — I10 Essential (primary) hypertension: Secondary | ICD-10-CM

## 2015-02-01 DIAGNOSIS — I6523 Occlusion and stenosis of bilateral carotid arteries: Secondary | ICD-10-CM | POA: Diagnosis not present

## 2015-02-01 DIAGNOSIS — K551 Chronic vascular disorders of intestine: Secondary | ICD-10-CM | POA: Diagnosis not present

## 2015-02-01 DIAGNOSIS — I771 Stricture of artery: Secondary | ICD-10-CM | POA: Diagnosis not present

## 2015-02-01 DIAGNOSIS — Z950 Presence of cardiac pacemaker: Secondary | ICD-10-CM

## 2015-02-01 NOTE — Progress Notes (Signed)
Patient ID: Thomas Snow, male   DOB: [birth year], 81 y.o.   MRN: [id number]     Cardiology Office Note    Date:  02/01/2015   ID:  Thomas Snow, DOB March 29, 1934, MRN [id number]  PCP:  Thomas Snow  Cardiologist:   Thomas Klein, MD   Chief Complaint  Patient presents with  . Follow-up    no chest pain, no shortness of breath, no edema, occassional pain or cramping in legs, no lightheadedness or dizziness    History of Present Illness:  Thomas Snow is a 81 y.o. male with a history of intermittent sinus node arrest and recurrent syncope that led to pacemaker implantation in 1985. He has paroxysmal atrial fibrillation and is on chronic anticoagulation with Xarelto. He also has an extensive history of atherosclerotic complications in the coronary, carotid, extremity, renal and mesenteric beds.  He presents for follow-up on pacemaker and anticoagulation. He has not had angina pectoris, focal neurological events to suggest stroke or TIA, intermittent claudication or abdominal pain. His weight loss has stopped and actually has slightly reversed. He has gained 8 pounds in the last 6 months. He remains significantly underweight with a BMI of only 17.5. He describes his appetite is "good". His medication list is now extremely short.  Pacemaker interrogation shows normal device function. His atrial and ventricular leads are still the original unipolar leads are placed in 1985. His generator is a [employer] very 3 times a day DR device implanted in 2006 and still has 3.75-8.25 years of estimated longevity. He only has 4% atrial pacing and less than 1% ventricular pacing. The burden of atrial arrhythmia appears to be very low. The longest episode recorded was around 20 minutes in duration. His device is not capable of recording electrograms, but the atrial rates suggest atrial flutter or atrial fibrillation.  He had cutting balloon angioplasty for a right coronary artery stenosis in  2001, without stent placement. Ischemia was detected by nuclear testing and he never had angina or dyspnea. He remains asymptomatic and had a low-risk nuclear study in 2012. He has preserved left ventricular systolic function.  He received a stent to the left subclavian artery for subclavian steal syndrome in 2001. He is now asymptomatic from the standpoint and his most recent ultrasound shows moderate bilateral subclavian artery stenosis.  He received a stent to the left renal artery in 2005. His most recent ultrasound shows mild bilateral renal artery stenosis, less than 60%.  He had a transient ischemic attack in 2013 and underwent a right carotid endarterectomy.  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  He is intolerant to statins including both atorvastatin and rosuvastatin.   Past Medical History  Diagnosis Date  . Hyperlipidemia   . Hypertension   . Pneumonia     as a child  . Pacemaker   . Arthritis   . Coronary artery disease     prox and mid RCA atherectomy '01  . Carotid artery occlusion     left SCA stent '01, carotid occlusive dz    Past Surgical History  Procedure Laterality Date  . Kidney stone surgery    . Cardiac catheterization    . Coronary angioplasty    . Kidney stent      left RAS '05  . Tonsillectomy    . Back surgery      approx 20 years ago  . Eye  surgery      bilateral cataract removal  . Knee surgery      right knee  . Cardiac pacemaker placement      [place] '85, '95; [place] '06  . Endarterectomy  09/05/2011    Procedure: ENDARTERECTOMY CAROTID;  Surgeon: Mal Misty, MD;  Location: [facility];  Service: Vascular;  Laterality: Right;  . Carotid endarterectomy Right 09-05-11    cea  . Peripheral vascular angiogram  01/22/2003    Left subclavian 85% fairly focal in-stent restenosis, dilated with a 7x2cm Powerflex balloon at 10-30  and 10-30, resulting in less than 10% residual narrowing. Left renal artery demonstrated 80-85% initially dilated with a 4x15 mm coronary cutting balloon at 10-30 and 12-30, exchanged for a 79mmx2cm Aviator balloon inflations were done at 6-15, 10-30, and 10-30, resulting less than 10% residual.  . Peripheral vascular angiogram  07/13/1999    Left subclavian 85% dilated with a 82mmx2cm Cordis Power-Flex balloon at 6-40, a P-204 iliac stent was hand-crimped on a 53mmx2cm Cordis Power-Flex balloon, expanded at 8-40 with reduction to 0%. Left Renal artery 75-80% stenosis, dilated with a 58mmx1.5cm Cordis Power-Flex balloon at 6-40, reduction of stenosis from 80% to 0%.  . Renal doppler  09/25/2012    Celiac artery and SMA-demonstrates narrowing with increased velocities consistent with greater than 50%, right renal- 1-59% diameter reduction, left renal artery stent- 1-59% diameter reduction.  . Cardiac catheterization  03/09/1999    RCA high-grade 95% stenosis, inflated with a 3.25 IVT cutting balloon at 6-64, 6-41, 6-52, and 6-53, resulting in reductiong of less than 10%. RCA proximal 75% stenosis inflated with a 2.75x10 IVT cutting balloon at 6-65 then exchanged for a 3x10 IVT cutting balloon inflated at 6-83.  . Cardiovascular stress test  08/09/2010    Normal pattern of perfusion in all regions. No ECG changes. EKG negative for ischemia.  . Transthoracic echocardiogram  09-25-2012    EF 55-60%, mild-moderate tricuspid valve regurg  . Icd generator change  10/20/2004    Implantation of [facility], model # 5357M/s, serial # [id number]    Outpatient Prescriptions Prior to Visit  Medication Sig Dispense Refill  . acetaminophen (TYLENOL) 325 MG tablet Take 650 mg by mouth 3 (three) times daily.    . fish oil-omega-3 fatty acids 1000 MG capsule Take 1 g by mouth daily.    . rivaroxaban (XARELTO) 20 MG TABS tablet Take 1 tablet (20 mg total) by mouth daily with supper. (Patient taking  differently: Take 20 mg by mouth at bedtime. ) 30 tablet 11  . cefUROXime (CEFTIN) 500 MG tablet Take 1 tablet (500 mg total) by mouth 2 (two) times daily with a meal. (Patient not taking: Reported on 02/01/2015) 6 tablet 0  . HYDROcodone-acetaminophen (NORCO/VICODIN) 5-325 MG per tablet Take 1 tablet by mouth every 6 (six) hours as needed. (Patient not taking: Reported on 02/01/2015) 15 tablet 0  . OVER THE COUNTER MEDICATION Take by mouth daily. Reported on 02/01/2015    . phenol (CHLORASEPTIC) 1.4 % LIQD Use as directed 1 spray in the mouth or throat as needed for throat irritation / pain. (Patient not taking: Reported on 02/01/2015) 1 Bottle 0  . phenylephrine (SUDAFED PE) 10 MG TABS tablet Take 20 mg by mouth every 6 (six) hours as needed. Reported on 02/01/2015    . sodium chloride (OCEAN) 0.65 % SOLN nasal spray Place 1 spray into both nostrils as needed for congestion. Reported on 02/01/2015  No facility-administered medications prior to visit.     Allergies:   Atorvastatin and Crestor   Social History   Social History  . Marital Status: Married    Spouse Name: N/A  . Number of Children: N/A  . Years of Education: N/A   Social History Main Topics  . Smoking status: Former Smoker -- 0.50 packs/day for 25 years    Types: Cigarettes    Quit date: 08/19/1996  . Smokeless tobacco: Current User    Types: Snuff  . Alcohol Use: No  . Drug Use: No  . Sexual Activity: Not Asked   Other Topics Concern  . None   Social History Narrative     Family History:  The patient's family history includes Arthritis in his father.   ROS:   Please see the history of present illness.    ROS All other systems reviewed and are negative.   PHYSICAL EXAM:   VS:  BP 130/66 mmHg  Pulse 60  Ht 5\' 6"  (1.676 m)  Wt 49.045 kg (108 lb 2 oz)  BMI 17.46 kg/m2   GEN: Well nourished, well developed, in no acute distress HEENT: normal Neck: no JVD, carotid bruits, or masses Cardiac: RRR; no murmurs,  rubs, or gallops,no edema; the right subclavian pacemaker site looks healthy. There is extensive collateral venous circulation overlying the left anterior chest and shoulder, suggestive of left subclavian vein occlusion with collateral formation. Respiratory:  clear to auscultation bilaterally, normal work of breathing GI: soft, nontender, nondistended, + BS MS: no deformity or atrophy Skin: warm and dry, no rash Neuro:  Alert and Oriented x 3, Strength and sensation are intact Psych: euthymic mood, full affect  Wt Readings from Last 3 Encounters:  02/01/15 49.045 kg (108 lb 2 oz)  08/10/14 45.768 kg (100 lb 14.4 oz)  02/08/14 50.44 kg (111 lb 3.2 oz)      Studies/Labs Reviewed:   EKG:  EKG is ordered today.  The ekg ordered today demonstrates atrial paced, ventricular sensed, otherwise normal  Recent Labs: 02/06/2014: ALT 14 02/08/2014: BUN 18; Creatinine, Ser 1.44*; Hemoglobin 12.3*; Platelets 238; Potassium 3.7; Sodium 139   Lipid Panel    Component Value Date/Time   CHOL 168 06/18/2012 1043   TRIG 183* 06/18/2012 1043   HDL 39* 06/18/2012 1043   CHOLHDL 4.3 06/18/2012 1043   VLDL 37 06/18/2012 1043   LDLCALC 92 06/18/2012 1043    ASSESSMENT:    1. Paroxysmal atrial fibrillation (HCC)   2. Pacemaker - dual chamber, implanted in 1985, last generator change 2006, [employer]. Jude   3. Carotid stenosis, bilateral s/p R CEA 2013   4. Subclavian arterial stenosis, bilateral   5. Chronic vascular insufficiency of intestine (HCC)   6. Coronary artery disease involving native coronary artery of native heart without angina pectoris   7. Essential hypertension   8. Hyperlipidemia   9. Underweight      PLAN:  In order of problems listed above:  1. Afib: Low burden, asymptomatic 2. PPM: Only functioning dual-chamber device. He still has his original unipolar leads. Estimated generator longevity is at least another 3.75 years. It is highly likely that his right subclavian vein is  occluded. If he requires lead change out will have to switch to the left side 3. Carotid stenosis: No recent neurological events. Ultrasound was being followed at [facility] 4. Bilateral subclavian artery stenosis he seems to have equal blood pressures in the right left upper extremity and does not have  claudication or steal syndrome complaints. 5. Celiac and SMA stenoses: He never described intestinal angina or symptoms of malabsorption. His weight loss is improving. No clear association between mesenteric insufficiency and weight loss. 6. CAD: No angina or any coronary events since his angioplasty more than 10 years ago. Note that he never had angina pectoris and that the abnormality was detected by nuclear perfusion testing. His most recent functional evaluation was a nuclear stress test in 2012 that did not show ischemia. He has normal left ventricular systolic function. In the absence of symptoms, routine imaging is likely not necessary. 7. HTN: Excellent blood pressure despite no medications. His hypertension was probably "cured" by massive weight loss 8. HLP: His lipid profile, while not perfect, is quite acceptable. Intolerant to statins. 9. He is no longer losing weight and has actually gained back some pounds, although he remains significantly underweight. Normal albumin in February 2016    Medication Adjustments/Labs and Tests Ordered: Current medicines are reviewed at length with the patient today.  Concerns regarding medicines are outlined above.  Medication changes, Labs and Tests ordered today are listed in the Patient Instructions below. Patient Instructions  Dr. Sallyanne Kuster recommends that you schedule a follow-up appointment in: [place] ([place])        Mikael Spray, MD  02/01/2015 4:48 PM    [address], [place], [place]  [address] Phone: [phone]; Fax: [phone]

## 2015-02-01 NOTE — Patient Instructions (Signed)
Dr. Sallyanne Kuster recommends that you schedule a follow-up appointment in: [place] ([place])

## 2015-03-15 DIAGNOSIS — Z23 Encounter for immunization: Secondary | ICD-10-CM | POA: Diagnosis not present

## 2015-03-15 DIAGNOSIS — Z681 Body mass index (BMI) 19 or less, adult: Secondary | ICD-10-CM | POA: Diagnosis not present

## 2015-03-15 DIAGNOSIS — I480 Paroxysmal atrial fibrillation: Secondary | ICD-10-CM | POA: Diagnosis not present

## 2015-03-15 DIAGNOSIS — E119 Type 2 diabetes mellitus without complications: Secondary | ICD-10-CM | POA: Diagnosis not present

## 2015-03-15 DIAGNOSIS — Z79899 Other long term (current) drug therapy: Secondary | ICD-10-CM | POA: Diagnosis not present

## 2015-03-15 DIAGNOSIS — I10 Essential (primary) hypertension: Secondary | ICD-10-CM | POA: Diagnosis not present

## 2015-03-15 DIAGNOSIS — I2510 Atherosclerotic heart disease of native coronary artery without angina pectoris: Secondary | ICD-10-CM | POA: Diagnosis not present

## 2015-05-09 IMAGING — CR DG CHEST 2V
2 series · 2 of 2 positions shown · non-contrast
Comparison: 02/06/2014

CLINICAL DATA: Followup pneumonia, dry cough

EXAM:
CHEST  2 VIEW

[w chest pa]
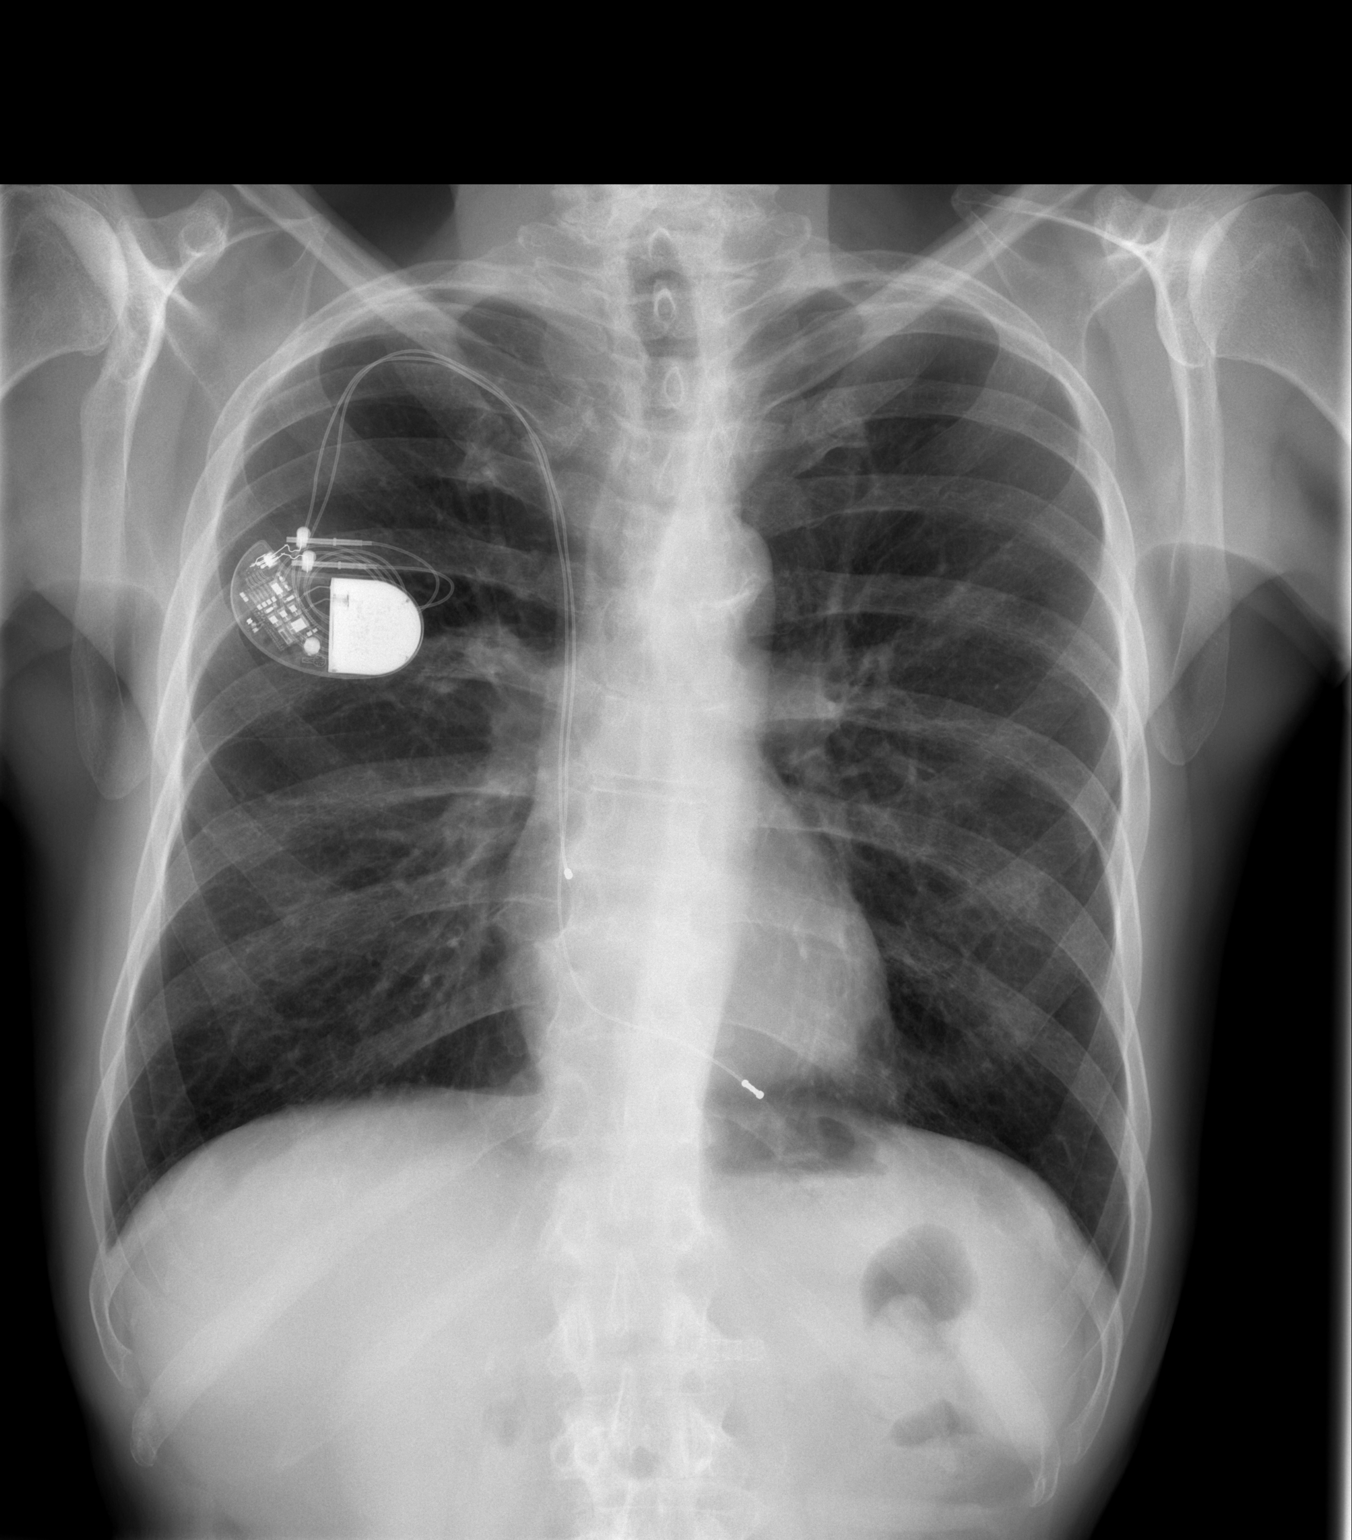

[w chest lat]
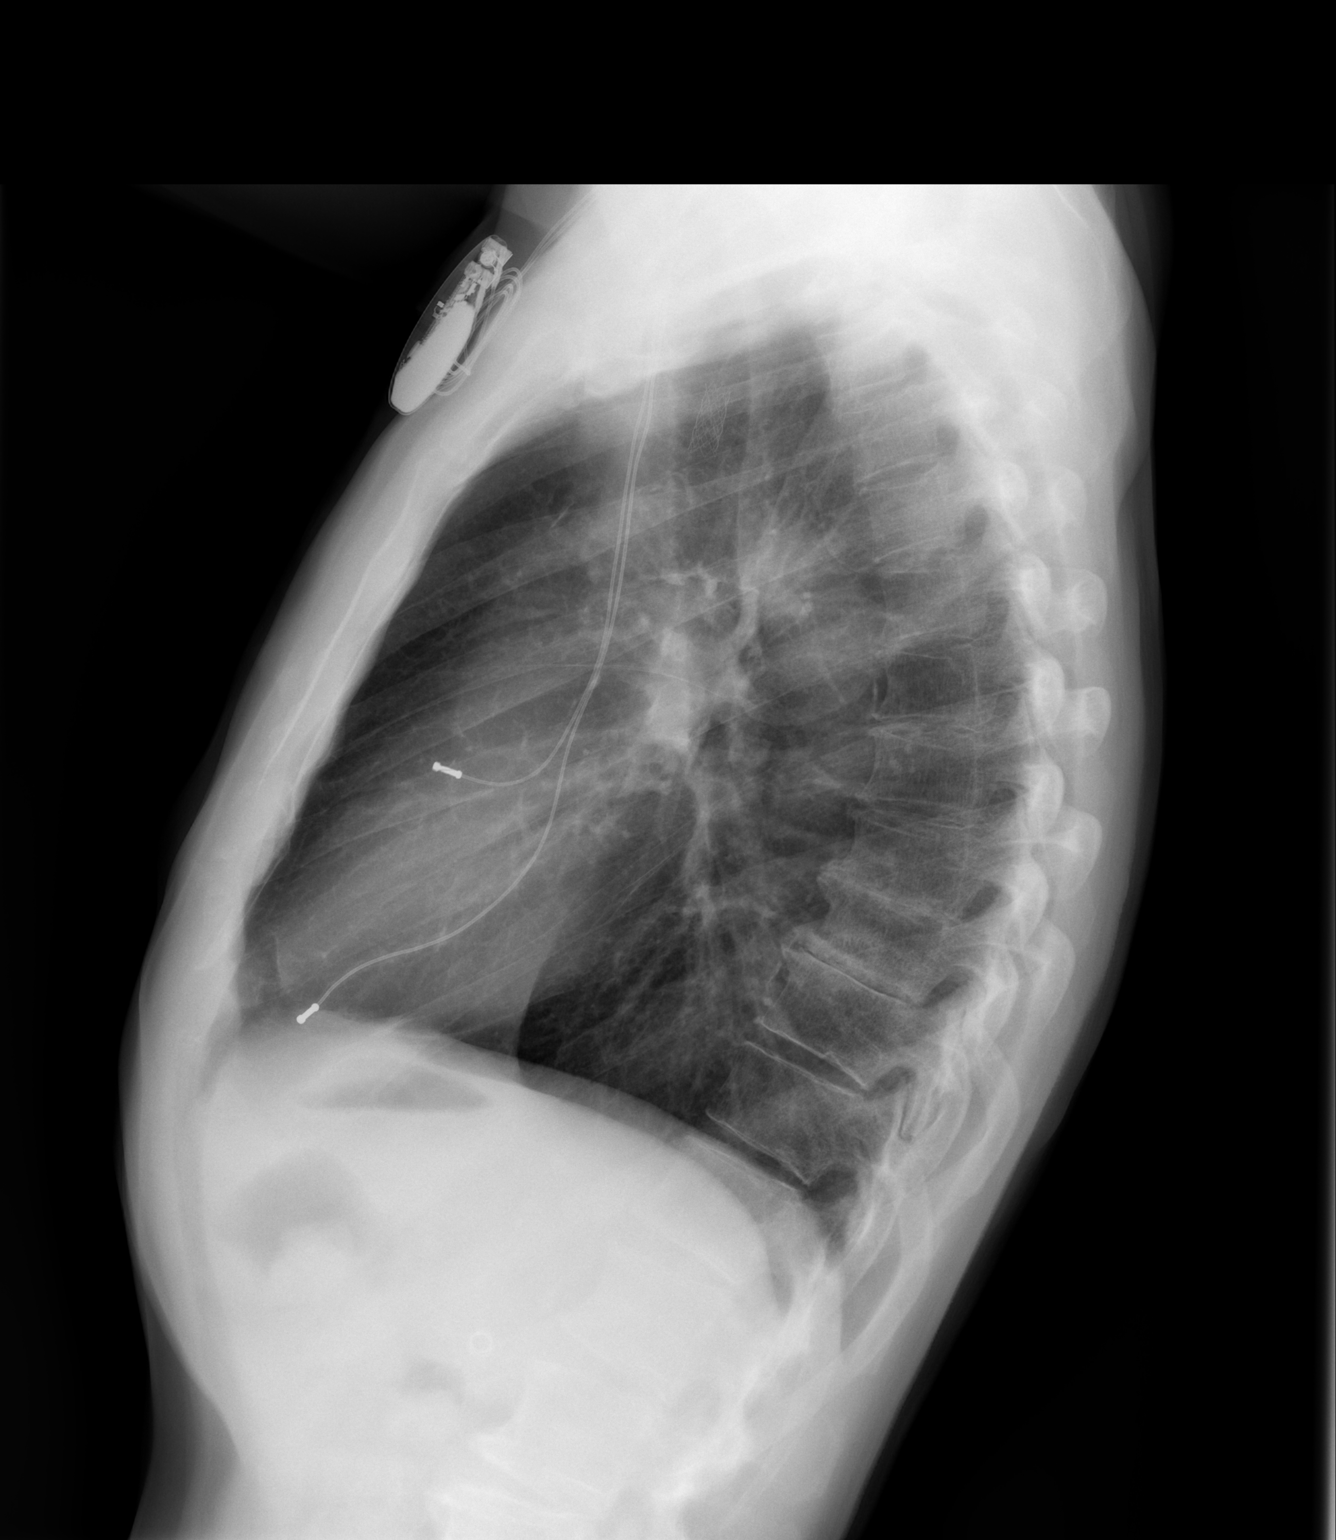

[2 of 2 positions shown; findings below may reference images not displayed]

FINDINGS: Cardiac shadow is stable. A pacing device is again noted. The
previously seen left medial lung base changes have resolved in the
interval. No focal infiltrate is noted. No acute bony abnormality is
noted.
IMPRESSION: Resolution of previously seen left basilar infiltrate.

## 2015-05-20 ENCOUNTER — Telehealth: Payer: Self-pay | Admitting: Cardiovascular Disease

## 2015-05-20 DIAGNOSIS — I6523 Occlusion and stenosis of bilateral carotid arteries: Secondary | ICD-10-CM

## 2015-05-20 NOTE — Telephone Encounter (Signed)
These were last checked though the vascular surgeon's office Set 2015 and I thought they would schedule them. But would be glad to do them in our office. Please order bilateral carotid duplex for stenosis s/p endarterectomy

## 2015-05-20 NOTE — Telephone Encounter (Signed)
New Message  Pt wife calling to speak w/ RN- pt had done doppler tests in thepast and wanted to know if he should again. Please call back and discuss.

## 2015-05-20 NOTE — Telephone Encounter (Signed)
Spoke with pt who is concerned that he did not get his follow up carotid dopplers done last year.  Could not find order in office visit notes from Dr Sallyanne Kuster. Pt is not having any complaints at this time, he just used to get the test done every year.  Please advise if pt need the test. Will route to Dr Sallyanne Kuster and [employer].

## 2015-05-23 NOTE — Telephone Encounter (Signed)
Carotid doppler ordered. Message sent to scheduling.  Left detailed message on voicemail.

## 2015-05-25 NOTE — Telephone Encounter (Signed)
Pt is scheduled for cartoids on 06/02/15.

## 2015-05-30 LAB — CUP PACEART INCLINIC DEVICE CHECK
Date Time Interrogation Session: 20170529220106
Implantable Lead Implant Date: 19850723
Implantable Lead Location: 753860
MDC IDC LEAD IMPLANT DT: 19850723
MDC IDC LEAD LOCATION: 753859
MDC IDC PG SERIAL: 1598850

## 2015-06-02 ENCOUNTER — Ambulatory Visit (HOSPITAL_COMMUNITY)
Admission: RE | Admit: 2015-06-02 | Discharge: 2015-06-02 | Disposition: A | Discharge status: Home / Self Care | Payer: PPO | Source: Ambulatory Visit | Attending: Cardiology | Admitting: Cardiology

## 2015-06-02 DIAGNOSIS — I2510 Atherosclerotic heart disease of native coronary artery without angina pectoris: Secondary | ICD-10-CM | POA: Diagnosis not present

## 2015-06-02 DIAGNOSIS — E785 Hyperlipidemia, unspecified: Secondary | ICD-10-CM | POA: Insufficient documentation

## 2015-06-02 DIAGNOSIS — Z950 Presence of cardiac pacemaker: Secondary | ICD-10-CM | POA: Insufficient documentation

## 2015-06-02 DIAGNOSIS — I6523 Occlusion and stenosis of bilateral carotid arteries: Secondary | ICD-10-CM | POA: Diagnosis not present

## 2015-06-02 DIAGNOSIS — I10 Essential (primary) hypertension: Secondary | ICD-10-CM | POA: Diagnosis not present

## 2015-06-09 ENCOUNTER — Telehealth: Payer: Self-pay | Admitting: Cardiovascular Disease

## 2015-06-09 NOTE — Telephone Encounter (Signed)
Called back and spoke to patient - explained I don't have a ROI to talk to anyone other than him. Results given to patient who verbalized understanding.

## 2015-06-09 NOTE — Telephone Encounter (Signed)
Follow Up  Pt wife called back for results.

## 2015-07-27 NOTE — Progress Notes (Signed)
Patient ID: Thomas Snow, male   DOB: 06/19/1934, 81 y.o.   MRN: [id number]     Cardiology Office Note    Date:  02/01/2015   ID:  TARIUS CONARY, DOB [birth year], MRN [id number]  PCP:  Fae Pippin  Cardiologist:   Sanda Klein, MD   Chief Complaint  Patient presents with  . Follow-up    no chest pain, no shortness of breath, no edema, occassional pain or cramping in legs, no lightheadedness or dizziness    History of Present Illness:  RANDAL WIESEHAN is a 81 y.o. male with a history of intermittent sinus node arrest and recurrent syncope that led to pacemaker implantation in 1985. He has paroxysmal atrial fibrillation and is on chronic anticoagulation with Xarelto. He also has an extensive history of atherosclerotic complications in the coronary, carotid, extremity, renal and mesenteric beds.  He presents for follow-up on pacemaker and anticoagulation. He has not had angina pectoris, focal neurological events to suggest stroke or TIA, intermittent claudication or abdominal pain. He had steady weight loss for a while, But his weight has stabilized at around the 108 pounds.. He remains significantly underweight with a BMI of only 18.   Pacemaker interrogation shows normal device function. His atrial and ventricular leads are still the original unipolar leads are placed in 1985. His generator is a Financial risk analyst DR device implanted in 2006 and still has 2.5-8 years of estimated longevity. He only has 8.5% atrial pacing and <1% ventricular pacing. The burden of atrial arrhythmia appears to be very low , under 1%. The longest episode recorded was 1-3 hours in duration. His device is not capable of recording electrograms, but the atrial rates suggest atrial flutter or atrial fibrillation.  He had cutting balloon angioplasty for a right coronary artery stenosis in 2001, without stent placement. Ischemia was detected by nuclear testing and he never had angina or dyspnea. He remains  asymptomatic and had a low-risk nuclear study in 2012. He has preserved left ventricular systolic function.  He received a stent to the left subclavian artery for subclavian steal syndrome in 2001. He is now asymptomatic from the standpoint and his most recent ultrasound shows moderate bilateral subclavian artery stenosis.  He received a stent to the left renal artery in 2005. His most recent ultrasound shows mild bilateral renal artery stenosis, less than 60%.  He had a transient ischemic attack in 2013 and underwent a right carotid endarterectomy.  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  He is intolerant to statins including both atorvastatin and rosuvastatin.   Past Medical History  Diagnosis Date  . Hyperlipidemia   . Hypertension   . Pneumonia     as a child  . Pacemaker   . Arthritis   . Coronary artery disease     prox and mid RCA atherectomy '01  . Carotid artery occlusion     left SCA stent '01, carotid occlusive dz    Past Surgical History  Procedure Laterality Date  . Kidney stone surgery    . Cardiac catheterization    . Coronary angioplasty    . Kidney stent      left RAS '05  . Tonsillectomy    . Back surgery      approx 20 years ago  . Eye surgery      bilateral cataract removal  . Knee surgery  right knee  . Cardiac pacemaker placement      [place] '85, '95; [place] '06  . Endarterectomy  09/05/2011    Procedure: ENDARTERECTOMY CAROTID;  Surgeon: Mal Misty, MD;  Location: [facility];  Service: Vascular;  Laterality: Right;  . Carotid endarterectomy Right 09-05-11    cea  . Peripheral vascular angiogram  01/22/2003    Left subclavian 85% fairly focal in-stent restenosis, dilated with a 7x2cm Powerflex balloon at 10-30 and 10-30, resulting in less than 10% residual narrowing. Left renal artery demonstrated 80-85% initially dilated with  a 4x15 mm coronary cutting balloon at 10-30 and 12-30, exchanged for a 63mmx2cm Aviator balloon inflations were done at 6-15, 10-30, and 10-30, resulting less than 10% residual.  . Peripheral vascular angiogram  07/13/1999    Left subclavian 85% dilated with a 46mmx2cm Cordis Power-Flex balloon at 6-40, a P-204 iliac stent was hand-crimped on a 43mmx2cm Cordis Power-Flex balloon, expanded at 8-40 with reduction to 0%. Left Renal artery 75-80% stenosis, dilated with a 65mmx1.5cm Cordis Power-Flex balloon at 6-40, reduction of stenosis from 80% to 0%.  . Renal doppler  09/25/2012    Celiac artery and SMA-demonstrates narrowing with increased velocities consistent with greater than 50%, right renal- 1-59% diameter reduction, left renal artery stent- 1-59% diameter reduction.  . Cardiac catheterization  03/09/1999    RCA high-grade 95% stenosis, inflated with a 3.25 IVT cutting balloon at 6-64, 6-41, 6-52, and 6-53, resulting in reductiong of less than 10%. RCA proximal 75% stenosis inflated with a 2.75x10 IVT cutting balloon at 6-65 then exchanged for a 3x10 IVT cutting balloon inflated at 6-83.  . Cardiovascular stress test  08/09/2010    Normal pattern of perfusion in all regions. No ECG changes. EKG negative for ischemia.  . Transthoracic echocardiogram  09-25-2012    EF 55-60%, mild-moderate tricuspid valve regurg  . Icd generator change  10/20/2004    Implantation of [facility], model # 5357M/s, serial # [id number]    Outpatient Prescriptions Prior to Visit  Medication Sig Dispense Refill  . acetaminophen (TYLENOL) 325 MG tablet Take 650 mg by mouth 3 (three) times daily.    . fish oil-omega-3 fatty acids 1000 MG capsule Take 1 g by mouth daily.    . rivaroxaban (XARELTO) 20 MG TABS tablet Take 1 tablet (20 mg total) by mouth daily with supper. (Patient taking differently: Take 20 mg by mouth at bedtime. ) 30 tablet 11  . cefUROXime (CEFTIN) 500 MG tablet Take 1 tablet (500 mg total) by  mouth 2 (two) times daily with a meal. (Patient not taking: Reported on 02/01/2015) 6 tablet 0  . HYDROcodone-acetaminophen (NORCO/VICODIN) 5-325 MG per tablet Take 1 tablet by mouth every 6 (six) hours as needed. (Patient not taking: Reported on 02/01/2015) 15 tablet 0  . OVER THE COUNTER MEDICATION Take by mouth daily. Reported on 02/01/2015    . phenol (CHLORASEPTIC) 1.4 % LIQD Use as directed 1 spray in the mouth or throat as needed for throat irritation / pain. (Patient not taking: Reported on 02/01/2015) 1 Bottle 0  . phenylephrine (SUDAFED PE) 10 MG TABS tablet Take 20 mg by mouth every 6 (six) hours as needed. Reported on 02/01/2015    . sodium chloride (OCEAN) 0.65 % SOLN nasal spray Place 1 spray into both nostrils as needed for congestion. Reported on 02/01/2015     No facility-administered medications prior to visit.     Allergies:   Atorvastatin and Crestor  Social History   Social History  . Marital Status: Married    Spouse Name: N/A  . Number of Children: N/A  . Years of Education: N/A   Social History Main Topics  . Smoking status: Former Smoker -- 0.50 packs/day for 25 years    Types: Cigarettes    Quit date: 08/19/1996  . Smokeless tobacco: Current User    Types: Snuff  . Alcohol Use: No  . Drug Use: No  . Sexual Activity: Not Asked   Other Topics Concern  . None   Social History Narrative     Family History:  The patient's family history includes Arthritis in his father.   ROS:   Please see the history of present illness.    ROS All other systems reviewed and are negative.   PHYSICAL EXAM:   VS:  BP 130/66 mmHg  Pulse 60  Ht 5\' 6"  (1.676 m)  Wt 49.045 kg (108 lb 2 oz)  BMI 17.46 kg/m2   GEN: Well nourished, well developed, in no acute distress  HEENT: normal  Neck: no JVD, carotid bruits, or masses Cardiac: RRR; no murmurs, rubs, or gallops,no edema; the right subclavian pacemaker site looks healthy. There is extensive collateral venous circulation  overlying the left anterior chest and shoulder, suggestive of left subclavian vein occlusion with collateral formation. Respiratory:  clear to auscultation bilaterally, normal work of breathing GI: soft, nontender, nondistended, + BS MS: no deformity or atrophy  Skin: warm and dry, no rash Neuro:  Alert and Oriented x 3, Strength and sensation are intact Psych: euthymic mood, full affect  Wt Readings from Last 3 Encounters:  02/01/15 49.045 kg (108 lb 2 oz)  08/10/14 45.768 kg (100 lb 14.4 oz)  02/08/14 50.44 kg (111 lb 3.2 oz)      Studies/Labs Reviewed:   EKG:  EKG is ordered today.  The ekg ordered today demonstrates atrial paced, ventricular sensed, otherwise normal  Recent Labs: 02/06/2014: ALT 14 02/08/2014: BUN 18; Creatinine, Ser 1.44*; Hemoglobin 12.3*; Platelets 238; Potassium 3.7; Sodium 139   Lipid Panel    Component Value Date/Time   CHOL 168 06/18/2012 1043   TRIG 183* 06/18/2012 1043   HDL 39* 06/18/2012 1043   CHOLHDL 4.3 06/18/2012 1043   VLDL 37 06/18/2012 1043   LDLCALC 92 06/18/2012 1043    ASSESSMENT:    1. Paroxysmal atrial fibrillation (HCC)   2. Pacemaker - dual chamber, implanted in 1985, last generator change 2006, St. Jude   3. Carotid stenosis, bilateral s/p R CEA 2013   4. Subclavian arterial stenosis, bilateral   5. Chronic vascular insufficiency of intestine (HCC)   6. Coronary artery disease involving native coronary artery of native heart without angina pectoris   7. Essential hypertension   8. Hyperlipidemia   9. Underweight      PLAN:  In order of problems listed above:  1. Afib: Low burden, asymptomatic. Good rate control. On Xarelto without bleeding complications. Needs renal function reviewed periodically. Get labs from his PCP. 2. PPM: Normally functioning dual-chamber device. He still has his original unipolar leads. It is highly likely that his right subclavian vein is occluded. If he requires lead change out will have to switch  to the left side. His overall pacing requirements are very low and this is likely to not be an issue for several more years. 3. Carotid stenosis: No recent neurological events. Ultrasound was being followed at the [facility] 4. Bilateral subclavian artery stenosis he seems to  have equal blood pressures in the right left upper extremity and does not have claudication or steal syndrome complaints. 5. Celiac and SMA stenoses: He never described intestinal angina or symptoms of malabsorption. His weight loss is improving. No clear association between mesenteric insufficiency and weight loss. 6. CAD: No angina or any coronary events since his angioplasty more than 10 years ago. Note that he never had angina pectoris and that the abnormality was detected by nuclear perfusion testing. His most recent functional evaluation was a nuclear stress test in 2012 that did not show ischemia. He has normal left ventricular systolic function. In the absence of symptoms, routine imaging is likely not necessary. 7. HTN: Excellent blood pressure despite no medications. His hypertension was probably "cured" by massive weight loss 8. HLP: Have not seen a lipid profile in the last couple of years and will request one from his primary care physician. Intolerant to statins. Again, due to his massive weight loss suspect this will be favorable. 9. He is no longer losing weight and has actually gained back some pounds, but remains significantly underweight.     Medication Adjustments/Labs and Tests Ordered: Current medicines are reviewed at length with the patient today.  Concerns regarding medicines are outlined above.  Medication changes, Labs and Tests ordered today are listed in the Patient Instructions below. Patient Instructions  Dr. Sallyanne Kuster recommends that you schedule a follow-up appointment in: [place] ([place])        Mikael Spray, MD  02/01/2015 4:48 PM    [address], [place], [place]  [address] Phone: [phone]; Fax: [phone]

## 2015-08-02 ENCOUNTER — Ambulatory Visit (INDEPENDENT_AMBULATORY_CARE_PROVIDER_SITE_OTHER): Payer: PPO | Admitting: Cardiovascular Disease

## 2015-08-02 ENCOUNTER — Encounter: Payer: Self-pay | Admitting: Cardiovascular Disease

## 2015-08-02 VITALS — BP 126/62 | HR 63 | Ht 65.0 in | Wt 108.2 lb

## 2015-08-02 DIAGNOSIS — I455 Other specified heart block: Secondary | ICD-10-CM

## 2015-08-02 DIAGNOSIS — I739 Peripheral vascular disease, unspecified: Secondary | ICD-10-CM | POA: Diagnosis not present

## 2015-08-02 DIAGNOSIS — E785 Hyperlipidemia, unspecified: Secondary | ICD-10-CM

## 2015-08-02 DIAGNOSIS — Z79899 Other long term (current) drug therapy: Secondary | ICD-10-CM

## 2015-08-02 DIAGNOSIS — I2510 Atherosclerotic heart disease of native coronary artery without angina pectoris: Secondary | ICD-10-CM | POA: Diagnosis not present

## 2015-08-02 DIAGNOSIS — I480 Paroxysmal atrial fibrillation: Secondary | ICD-10-CM

## 2015-08-02 LAB — CUP PACEART INCLINIC DEVICE CHECK
Date Time Interrogation Session: 20170801145930
Implantable Lead Implant Date: 19850723
Lead Channel Pacing Threshold Amplitude: 1.25 V
Lead Channel Pacing Threshold Amplitude: 1.75 V
Lead Channel Sensing Intrinsic Amplitude: 1 mV
Lead Channel Setting Pacing Amplitude: 3.5 V
Lead Channel Setting Pacing Pulse Width: 0.6 ms
Lead Channel Setting Sensing Sensitivity: 2.5 mV
MDC IDC LEAD IMPLANT DT: 19850723
MDC IDC LEAD LOCATION: 753859
MDC IDC LEAD LOCATION: 753860
MDC IDC MSMT BATTERY IMPEDANCE: 1700 Ohm
MDC IDC MSMT BATTERY VOLTAGE: 2.76 V
MDC IDC MSMT LEADCHNL RA IMPEDANCE VALUE: 440 Ohm
MDC IDC MSMT LEADCHNL RA PACING THRESHOLD PULSEWIDTH: 0.6 ms
MDC IDC MSMT LEADCHNL RV IMPEDANCE VALUE: 524 Ohm
MDC IDC MSMT LEADCHNL RV PACING THRESHOLD PULSEWIDTH: 0.6 ms
MDC IDC MSMT LEADCHNL RV SENSING INTR AMPL: 6.2 mV
MDC IDC PG SERIAL: 1598850
MDC IDC SET LEADCHNL RA PACING AMPLITUDE: 2.5 V

## 2015-08-02 NOTE — Patient Instructions (Signed)
Dr Sallyanne Kuster recommends that you continue on your current medications as directed. Please refer to the Current Medication list given to you today.  Your physician recommends that you return for lab work in September with Cyndi Bender, [place]. Please take the lab slips provided with you to that appointment. Please have them send [place] a copy of the lab work.  Dr Sallyanne Kuster recommends that you schedule a follow-up appointment in 6 months with a device check. You will receive a reminder letter in the mail two months in advance. If you don't receive a letter, please call our office to schedule the follow-up appointment.  If you need a refill on your cardiac medications before your next appointment, please call your pharmacy.

## 2015-08-03 ENCOUNTER — Encounter: Payer: Self-pay | Admitting: Cardiovascular Disease

## 2015-09-19 DIAGNOSIS — E119 Type 2 diabetes mellitus without complications: Secondary | ICD-10-CM | POA: Diagnosis not present

## 2015-09-19 DIAGNOSIS — R413 Other amnesia: Secondary | ICD-10-CM | POA: Diagnosis not present

## 2015-09-19 DIAGNOSIS — Z0000 Encounter for general adult medical examination without abnormal findings: Secondary | ICD-10-CM | POA: Diagnosis not present

## 2015-09-19 DIAGNOSIS — I10 Essential (primary) hypertension: Secondary | ICD-10-CM | POA: Diagnosis not present

## 2015-09-19 DIAGNOSIS — Z23 Encounter for immunization: Secondary | ICD-10-CM | POA: Diagnosis not present

## 2015-09-19 DIAGNOSIS — Z79899 Other long term (current) drug therapy: Secondary | ICD-10-CM | POA: Diagnosis not present

## 2015-09-19 DIAGNOSIS — Z1389 Encounter for screening for other disorder: Secondary | ICD-10-CM | POA: Diagnosis not present

## 2015-09-19 DIAGNOSIS — I480 Paroxysmal atrial fibrillation: Secondary | ICD-10-CM | POA: Diagnosis not present

## 2015-09-19 DIAGNOSIS — Z139 Encounter for screening, unspecified: Secondary | ICD-10-CM | POA: Diagnosis not present

## 2015-09-19 DIAGNOSIS — Z8673 Personal history of transient ischemic attack (TIA), and cerebral infarction without residual deficits: Secondary | ICD-10-CM | POA: Diagnosis not present

## 2015-09-19 DIAGNOSIS — E7800 Pure hypercholesterolemia, unspecified: Secondary | ICD-10-CM | POA: Diagnosis not present

## 2015-09-19 DIAGNOSIS — Z125 Encounter for screening for malignant neoplasm of prostate: Secondary | ICD-10-CM | POA: Diagnosis not present

## 2015-11-15 DIAGNOSIS — F0390 Unspecified dementia without behavioral disturbance: Secondary | ICD-10-CM | POA: Diagnosis not present

## 2015-11-15 DIAGNOSIS — Z681 Body mass index (BMI) 19 or less, adult: Secondary | ICD-10-CM | POA: Diagnosis not present

## 2016-01-24 DIAGNOSIS — R0789 Other chest pain: Secondary | ICD-10-CM | POA: Diagnosis not present

## 2016-01-24 DIAGNOSIS — I2510 Atherosclerotic heart disease of native coronary artery without angina pectoris: Secondary | ICD-10-CM | POA: Diagnosis not present

## 2016-01-24 DIAGNOSIS — R6883 Chills (without fever): Secondary | ICD-10-CM | POA: Diagnosis not present

## 2016-03-20 DIAGNOSIS — E7800 Pure hypercholesterolemia, unspecified: Secondary | ICD-10-CM | POA: Diagnosis not present

## 2016-03-20 DIAGNOSIS — G459 Transient cerebral ischemic attack, unspecified: Secondary | ICD-10-CM | POA: Diagnosis not present

## 2016-03-20 DIAGNOSIS — I480 Paroxysmal atrial fibrillation: Secondary | ICD-10-CM | POA: Diagnosis not present

## 2016-03-20 DIAGNOSIS — M7742 Metatarsalgia, left foot: Secondary | ICD-10-CM | POA: Diagnosis not present

## 2016-03-20 DIAGNOSIS — I2510 Atherosclerotic heart disease of native coronary artery without angina pectoris: Secondary | ICD-10-CM | POA: Diagnosis not present

## 2016-03-20 DIAGNOSIS — E119 Type 2 diabetes mellitus without complications: Secondary | ICD-10-CM | POA: Diagnosis not present

## 2016-03-20 DIAGNOSIS — F0390 Unspecified dementia without behavioral disturbance: Secondary | ICD-10-CM | POA: Diagnosis not present

## 2016-03-20 DIAGNOSIS — Z79899 Other long term (current) drug therapy: Secondary | ICD-10-CM | POA: Diagnosis not present

## 2016-04-09 DIAGNOSIS — M79662 Pain in left lower leg: Secondary | ICD-10-CM | POA: Diagnosis not present

## 2016-04-09 DIAGNOSIS — M79671 Pain in right foot: Secondary | ICD-10-CM | POA: Diagnosis not present

## 2016-04-11 ENCOUNTER — Telehealth: Payer: Self-pay | Admitting: Cardiology

## 2016-04-11 NOTE — Telephone Encounter (Signed)
Received records from [facility] for appointment on 04/13/16 with Kerin Ransom PA.  Records put with Luke's schedule for 04/13/16. lp

## 2016-04-13 ENCOUNTER — Ambulatory Visit (INDEPENDENT_AMBULATORY_CARE_PROVIDER_SITE_OTHER): Payer: PPO | Admitting: Cardiology

## 2016-04-13 ENCOUNTER — Encounter: Payer: Self-pay | Admitting: Cardiology

## 2016-04-13 VITALS — BP 140/70 | HR 76 | Ht 66.0 in | Wt 102.8 lb

## 2016-04-13 DIAGNOSIS — I6523 Occlusion and stenosis of bilateral carotid arteries: Secondary | ICD-10-CM | POA: Diagnosis not present

## 2016-04-13 DIAGNOSIS — I2510 Atherosclerotic heart disease of native coronary artery without angina pectoris: Secondary | ICD-10-CM

## 2016-04-13 DIAGNOSIS — I701 Atherosclerosis of renal artery: Secondary | ICD-10-CM | POA: Diagnosis not present

## 2016-04-13 DIAGNOSIS — I480 Paroxysmal atrial fibrillation: Secondary | ICD-10-CM

## 2016-04-13 DIAGNOSIS — I771 Stricture of artery: Secondary | ICD-10-CM | POA: Diagnosis not present

## 2016-04-13 DIAGNOSIS — K559 Vascular disorder of intestine, unspecified: Secondary | ICD-10-CM | POA: Diagnosis not present

## 2016-04-13 DIAGNOSIS — I739 Peripheral vascular disease, unspecified: Secondary | ICD-10-CM | POA: Diagnosis not present

## 2016-04-13 DIAGNOSIS — Z7901 Long term (current) use of anticoagulants: Secondary | ICD-10-CM | POA: Diagnosis not present

## 2016-04-13 DIAGNOSIS — Z950 Presence of cardiac pacemaker: Secondary | ICD-10-CM | POA: Diagnosis not present

## 2016-04-13 MED ORDER — CILOSTAZOL 50 MG PO TABS: 50.0000 mg | ORAL_TABLET | Freq: Two times a day (BID) | ORAL | 3 refills | Status: DC

## 2016-04-13 NOTE — Assessment & Plan Note (Signed)
Wide spread PVD with subclavian, renal, celiac, carotid, coronary disease

## 2016-04-13 NOTE — Assessment & Plan Note (Signed)
S/P Lt RA stent '05, bilateral 60% RAS on f/u dopplers

## 2016-04-13 NOTE — Assessment & Plan Note (Signed)
NSR today 

## 2016-04-13 NOTE — Progress Notes (Signed)
04/13/2016 Thomas Snow   12-03-1934  [id number]  Primary Physician Cyndi Bender, PA-C Primary Cardiologist: Dr Sallyanne Kuster  HPI:  Pleasant 82 y/o Caucasian male with wide spread vascular disease as listed below and s/p pacemaker followed by Dr Sallyanne Kuster. He is in the office today after he was seen recently at an Urgent Care for Lt foot pain and redness. His symptoms have been going on for 6 months or so and were attributed to gout. He also described Lt calf claudication after one block. ABIs done 04/09/16 showed an ABI of 1.31. On Rt and 0.35 on the Lt. He was referred here for further evaluation. He has been stable from a cardiac standpoint- no chest pain. His diet is stable, he eats several small meals a day and his wgt is stable. A Recent BUN/ SCr from his PCP- 19/1.34.    Current Outpatient Prescriptions  Medication Sig Dispense Refill  . acetaminophen (TYLENOL) 325 MG tablet Take 650 mg by mouth 3 (three) times daily.    Marland Kitchen donepezil (ARICEPT) 10 MG tablet Take 10 mg by mouth every morning.    . fish oil-omega-3 fatty acids 1000 MG capsule Take 1 g by mouth daily.    Alveda Reasons 20 MG TABS tablet Take 1 tablet by mouth at bedtime.    . cilostazol (PLETAL) 50 MG tablet Take 1 tablet (50 mg total) by mouth 2 (two) times daily. 180 tablet 3   No current facility-administered medications for this visit.     Allergies  Allergen Reactions  . Atorvastatin     Leg pain  . Crestor [Rosuvastatin]     Leg pain    Past Medical History:  Diagnosis Date  . Arthritis   . Carotid artery occlusion    left SCA stent '01, carotid occlusive dz  . Coronary artery disease    prox and mid RCA atherectomy '01  . Hyperlipidemia   . Hypertension   . Pacemaker   . Pneumonia    as a child    Social History   Social History  . Marital status: Married    Spouse name: N/A  . Number of children: N/A  . Years of education: N/A   Occupational History  . Not on file.   Social History Main  Topics  . Smoking status: Former Smoker    Packs/day: 0.50    Years: 25.00    Types: Cigarettes    Quit date: 08/19/1996  . Smokeless tobacco: Current User    Types: Snuff  . Alcohol use No  . Drug use: No  . Sexual activity: Not on file   Other Topics Concern  . Not on file   Social History Narrative  . No narrative on file     Family History  Problem Relation Age of Onset  . Arthritis Father      Review of Systems: General: negative for chills, fever, night sweats or weight changes.  Cardiovascular: negative for chest pain, dyspnea on exertion, edema, orthopnea, palpitations, paroxysmal nocturnal dyspnea or shortness of breath Dermatological: negative for rash Respiratory: negative for cough or wheezing Urologic: negative for hematuria Abdominal: negative for nausea, vomiting, diarrhea, bright red blood per rectum, melena, or hematemesis Neurologic: negative for visual changes, syncope, or dizziness All other systems reviewed and are otherwise negative except as noted above.    Blood pressure 140/70, pulse 76, height 5\' 6"  (1.676 m), weight 102 lb 12.8 oz (46.6 kg).  General appearance: alert, cooperative, cachectic and no distress  Neck: no JVD and bilateral carotid bruits Lungs: clear to auscultation bilaterally Heart: regular rate and rhythm Abdomen: bilateral abdominal bruits- Lt> Rt Extremities: Lt foot red, warm to touch, no hair growth, no palpable pulse, no sores or open wounds.  Pulses: diminnished distal pulses on Lt, good radial pulses bilateraly and intact PT pulse RLE.  Skin: Skin color, texture, turgor normal. No rashes or lesions Neurologic: Grossly normal  EKG NSR  ASSESSMENT AND PLAN:   Claudication in peripheral vascular disease ([place]) Seen recently at Urgent Care for Lt foot pain and redness, thought to be gout.  ABI's show an ABI of 0.35 on Lt  PAD (peripheral artery disease) ([place]) Wide spread PVD with subclavian, renal, celiac, carotid,  coronary disease  Carotid stenosis, bilateral s/p R CEA 2013 TIA 2013, Rt carotid endarterectomy 2013 Dr. Kellie Simmering  CAD s/p RCA angioplasty 2001 Cutting Balloon angioplasty to right coronary 2001 normal nuclear stress test 2012, normal EF by echo September 2014  Pacemaker - dual chamber, implanted in 1985, last generator change 2006, St. Jude Unipolar leads implanted in 1985 (right atrial 483-4 and right ventricular 487-5) Last generator change St. Jude Verity ADL XL DR 2006  Paroxysmal atrial fibrillation NSR today  Subclavian arterial stenosis, bilateral status post left subclavian artery stent 2001 Bilateral 50-69% stenoses ultrasound 2013  Mesenteric ischemia (HCC) Chronic mesenteric and celiac disease, his wgt is stable at about 100 lbs  Renal artery stenosis (HCC) S/P Lt RA stent '05, bilateral 60% RAS on f/u dopplers  Chronic anticoagulation CHA2DS2 VASc=5   PLAN  I have added Pletal 50 mg BID. I will arrange evaluation ASAP with Dr Gwenlyn Found but the pt does seem stable at the moment. They know to contact us if his foot goes cold or pale.   Kerin Ransom PA-C 04/13/2016 2:49 PM

## 2016-04-13 NOTE — Assessment & Plan Note (Signed)
Seen recently at Urgent Care for Lt foot pain and redness, thought to be gout.  ABI's show an ABI of 0.35 on Lt

## 2016-04-13 NOTE — Assessment & Plan Note (Signed)
Cutting Balloon angioplasty to right coronary 2001 normal nuclear stress test 2012, normal EF by echo September 2014

## 2016-04-13 NOTE — Assessment & Plan Note (Signed)
Unipolar leads implanted in 1985 (right atrial 483-4 and right ventricular 487-5) Last generator change St. Jude Verity ADL XL DR 2006

## 2016-04-13 NOTE — Assessment & Plan Note (Signed)
status post left subclavian artery stent 2001 Bilateral 50-69% stenoses ultrasound 2013

## 2016-04-13 NOTE — Assessment & Plan Note (Signed)
Chronic mesenteric and celiac disease, his wgt is stable at about 100 lbs

## 2016-04-13 NOTE — Assessment & Plan Note (Signed)
CHA2DS2 VASc=5

## 2016-04-13 NOTE — Assessment & Plan Note (Signed)
TIA 2013, Rt carotid endarterectomy 2013 Dr. Kellie Simmering

## 2016-04-13 NOTE — Patient Instructions (Signed)
Medication Instructions:  START cilostazol (Pletal) 50mg  (1 tablet) two times daily.  Labwork: NONE  Testing/Procedures: NONE  Follow-Up: Your physician recommends that you schedule a follow-up appointment in: ASAP with Dr. Gwenlyn Found (PV consult)   Any Other Special Instructions Will Be Listed Below (If Applicable).     If you need a refill on your cardiac medications before your next appointment, please call your pharmacy.

## 2016-04-18 ENCOUNTER — Telehealth: Payer: Self-pay | Admitting: Cardiovascular Disease

## 2016-04-18 NOTE — Telephone Encounter (Signed)
New message    Pt wife calling saying that pt states his leg is hurting - no swelling and thinks something is going on with his veins. He wants to be seen today with Dr. Gwenlyn Found and requests a call back asap

## 2016-04-18 NOTE — Telephone Encounter (Signed)
Returned the phone call to the patient's wife. She stated that since his last appointment that his left leg has gotten more painful (pain 9/10) and redder. He stated that it is cold to touch but that is not new. He requested an earlier appointment with Dr. Gwenlyn Found which has been moved up to 4/20 at 11:00. The patient's wife was informed that if it continued to get worse then she could go to the ED. She verbalized her understanding.

## 2016-04-20 ENCOUNTER — Encounter: Payer: Self-pay | Admitting: Cardiovascular Disease

## 2016-04-20 ENCOUNTER — Ambulatory Visit (INDEPENDENT_AMBULATORY_CARE_PROVIDER_SITE_OTHER): Payer: PPO | Admitting: Cardiovascular Disease

## 2016-04-20 VITALS — BP 138/56 | HR 74 | Ht 66.0 in | Wt 102.0 lb

## 2016-04-20 DIAGNOSIS — I739 Peripheral vascular disease, unspecified: Secondary | ICD-10-CM | POA: Diagnosis not present

## 2016-04-20 NOTE — Patient Instructions (Signed)
Medication Instructions: Your physician recommends that you continue on your current medications as directed. Please refer to the Current Medication list given to you today.  Testing/Procedures: Your physician has requested that you have a lower extremity arterial duplex. During this test, ultrasound is used to evaluate arterial blood flow in the legs. Allow one hour for this exam. There are no restrictions or special instructions.  Your physician has requested that you have an ankle brachial index (ABI). During this test an ultrasound and blood pressure cuff are used to evaluate the arteries that supply the arms and legs with blood. Allow thirty minutes for this exam. There are no restrictions or special instructions.  Follow-Up: Your physician recommends that you schedule a follow-up appointment after testing.  If you need a refill on your cardiac medications before your next appointment, please call your pharmacy.

## 2016-04-20 NOTE — Progress Notes (Signed)
04/20/2016 Thomas Snow   02-25-1934  [id number]  Primary Physician Cyndi Bender, PA-C Primary Cardiologist: Lorretta Harp MD Renae Gloss  HPI:  [name] is an 82 year old thin and frail appearing Caucasian male accompanied by his wife [name]. The father of 4 children. He is seen by Dr. Sallyanne Kuster. He has a history of peripheral arterial disease and coronary artery disease. He has a history of coronary intervention back in 2001, remote left subclavian stenting, pacemaker implantation with paroxysmal atrial fibrillation maintaining sinus rhythm on the Route oral anticoagulation. He does have a serum creatinine in the mid 1 range. ABIs performed regularly revealed a left ABI 0.35. He complains of chronic redness and pain in his left foot as well as left calf claudication.   Current Outpatient Prescriptions  Medication Sig Dispense Refill  . acetaminophen (TYLENOL) 325 MG tablet Take 650 mg by mouth 3 (three) times daily.    . cilostazol (PLETAL) 50 MG tablet Take 1 tablet (50 mg total) by mouth 2 (two) times daily. 180 tablet 3  . donepezil (ARICEPT) 10 MG tablet Take 10 mg by mouth every morning.    . fish oil-omega-3 fatty acids 1000 MG capsule Take 1 g by mouth daily.    Alveda Reasons 20 MG TABS tablet Take 1 tablet by mouth at bedtime.     No current facility-administered medications for this visit.     Allergies  Allergen Reactions  . Atorvastatin     Leg pain  . Crestor [Rosuvastatin]     Leg pain    Social History   Social History  . Marital status: Married    Spouse name: N/A  . Number of children: N/A  . Years of education: N/A   Occupational History  . Not on file.   Social History Main Topics  . Smoking status: Former Smoker    Packs/day: 0.50    Years: 25.00    Types: Cigarettes    Quit date: 08/19/1996  . Smokeless tobacco: Current User    Types: Snuff  . Alcohol use No  . Drug use: No  . Sexual activity: Not on file   Other  Topics Concern  . Not on file   Social History Narrative  . No narrative on file     Review of Systems: General: negative for chills, fever, night sweats or weight changes.  Cardiovascular: negative for chest pain, dyspnea on exertion, edema, orthopnea, palpitations, paroxysmal nocturnal dyspnea or shortness of breath Dermatological: negative for rash Respiratory: negative for cough or wheezing Urologic: negative for hematuria Abdominal: negative for nausea, vomiting, diarrhea, bright red blood per rectum, melena, or hematemesis Neurologic: negative for visual changes, syncope, or dizziness All other systems reviewed and are otherwise negative except as noted above.    Blood pressure (!) 138/56, pulse 74, height 5\' 6"  (1.676 m), weight 102 lb (46.3 kg).  General appearance: alert and no distress Neck: no adenopathy, no carotid bruit, no JVD, supple, symmetrical, trachea midline and thyroid not enlarged, symmetric, no tenderness/mass/nodules Lungs: clear to auscultation bilaterally Heart: regular rate and rhythm, S1, S2 normal, no murmur, click, rub or gallop Extremities: extremities normal, atraumatic, no cyanosis or edema  EKG Not performed today  ASSESSMENT AND PLAN:   PAD (peripheral artery disease) ([place]) Mr. [name] was referred to me by Kerin Ransom for evaluation of symptomatic resting leg pain probably related to critical limb ischemia. He has a long history of peripheral arterial disease status post remote left  subclavian stenting, carotid endarterectomy as well as mesenteric disease. He's had Dopplers in the past revealing a left ABI of 0.35. He does complain of chronic redness and pain in his left foot as well as claudication. He has moderate renal insufficiency with a creatinine at least in the mid 1 range. I'm going to get lower extremity arterial Doppler studies and see him back after that for further evaluation. He may ultimately require angiography and intervention for  relief of symptoms.      Lorretta Harp MD FACP,FACC,FAHA, [facility] 04/20/2016 11:28 AM

## 2016-04-20 NOTE — Assessment & Plan Note (Signed)
Thomas Snow was referred to me by Kerin Ransom for evaluation of symptomatic resting leg pain probably related to critical limb ischemia. He has a long history of peripheral arterial disease status post remote left subclavian stenting, carotid endarterectomy as well as mesenteric disease. He's had Dopplers in the past revealing a left ABI of 0.35. He does complain of chronic redness and pain in his left foot as well as claudication. He has moderate renal insufficiency with a creatinine at least in the mid 1 range. I'm going to get lower extremity arterial Doppler studies and see him back after that for further evaluation. He may ultimately require angiography and intervention for relief of symptoms.

## 2016-04-24 ENCOUNTER — Encounter: Payer: PPO | Admitting: Cardiovascular Disease

## 2016-05-08 ENCOUNTER — Ambulatory Visit (HOSPITAL_COMMUNITY)
Admission: RE | Admit: 2016-05-08 | Discharge: 2016-05-08 | Disposition: A | Discharge status: Home / Self Care | Payer: PPO | Source: Ambulatory Visit | Attending: Cardiology | Admitting: Cardiology

## 2016-05-08 DIAGNOSIS — I10 Essential (primary) hypertension: Secondary | ICD-10-CM | POA: Insufficient documentation

## 2016-05-08 DIAGNOSIS — I2510 Atherosclerotic heart disease of native coronary artery without angina pectoris: Secondary | ICD-10-CM | POA: Diagnosis not present

## 2016-05-08 DIAGNOSIS — I739 Peripheral vascular disease, unspecified: Secondary | ICD-10-CM | POA: Insufficient documentation

## 2016-05-08 DIAGNOSIS — Z87898 Personal history of other specified conditions: Secondary | ICD-10-CM | POA: Insufficient documentation

## 2016-05-08 DIAGNOSIS — E785 Hyperlipidemia, unspecified: Secondary | ICD-10-CM | POA: Diagnosis not present

## 2016-05-11 ENCOUNTER — Encounter (HOSPITAL_COMMUNITY): Payer: PPO

## 2016-05-16 ENCOUNTER — Encounter: Payer: Self-pay | Admitting: Cardiovascular Disease

## 2016-05-16 ENCOUNTER — Ambulatory Visit (INDEPENDENT_AMBULATORY_CARE_PROVIDER_SITE_OTHER): Payer: PPO | Admitting: Cardiovascular Disease

## 2016-05-16 VITALS — BP 124/67 | HR 64 | Ht 66.0 in | Wt 100.8 lb

## 2016-05-16 DIAGNOSIS — I6523 Occlusion and stenosis of bilateral carotid arteries: Secondary | ICD-10-CM

## 2016-05-16 DIAGNOSIS — I739 Peripheral vascular disease, unspecified: Secondary | ICD-10-CM | POA: Diagnosis not present

## 2016-05-16 DIAGNOSIS — I10 Essential (primary) hypertension: Secondary | ICD-10-CM

## 2016-05-16 DIAGNOSIS — I2510 Atherosclerotic heart disease of native coronary artery without angina pectoris: Secondary | ICD-10-CM

## 2016-05-16 NOTE — Progress Notes (Signed)
05/16/2016 Thomas Snow   10/26/1934  [id number]  Primary Physician Cyndi Bender, PA-C Primary Cardiologist: Lorretta Harp MD Renae Gloss  HPI:  Thomas Snow is an 82 year old thin and frail appearing Caucasian male accompanied by his wife Thomas Snow. The father of 4 children. He is seen by Dr. Sallyanne Kuster. I last saw him in the office 04/20/16. He has a history of peripheral arterial disease and coronary artery disease. He has a history of coronary intervention back in 2001, remote left subclavian stenting, pacemaker implantation with paroxysmal atrial fibrillation maintaining sinus rhythm on the Route oral anticoagulation. He does have a serum creatinine in the mid 1 range. ABIs performed regularly revealed a left ABI 0.35. He complains of chronic redness and pain in his left foot as well as left calf claudication. Dopplers performed 05/08/16 revealed a left ABI 0.3 with an occluded left SFA.   Current Outpatient Prescriptions  Medication Sig Dispense Refill  . acetaminophen (TYLENOL) 325 MG tablet Take 650 mg by mouth 3 (three) times daily.    . cilostazol (PLETAL) 50 MG tablet Take 1 tablet (50 mg total) by mouth 2 (two) times daily. 180 tablet 3  . donepezil (ARICEPT) 10 MG tablet Take 10 mg by mouth every morning.    . fish oil-omega-3 fatty acids 1000 MG capsule Take 1 g by mouth daily.    Alveda Reasons 20 MG TABS tablet Take 1 tablet by mouth at bedtime.     No current facility-administered medications for this visit.     Allergies  Allergen Reactions  . Atorvastatin     Leg pain  . Crestor [Rosuvastatin]     Leg pain    Social History   Social History  . Marital status: Married    Spouse name: N/A  . Number of children: N/A  . Years of education: N/A   Occupational History  . Not on file.   Social History Main Topics  . Smoking status: Former Smoker    Packs/day: 0.50    Years: 25.00    Types: Cigarettes    Quit date: 08/19/1996  . Smokeless  tobacco: Current User    Types: Snuff  . Alcohol use No  . Drug use: No  . Sexual activity: Not on file   Other Topics Concern  . Not on file   Social History Narrative  . No narrative on file     Review of Systems: General: negative for chills, fever, night sweats or weight changes.  Cardiovascular: negative for chest pain, dyspnea on exertion, edema, orthopnea, palpitations, paroxysmal nocturnal dyspnea or shortness of breath Dermatological: negative for rash Respiratory: negative for cough or wheezing Urologic: negative for hematuria Abdominal: negative for nausea, vomiting, diarrhea, bright red blood per rectum, melena, or hematemesis Neurologic: negative for visual changes, syncope, or dizziness All other systems reviewed and are otherwise negative except as noted above.    Blood pressure 124/67, pulse 64, height 5\' 6"  (1.676 m), weight 100 lb 12.8 oz (45.7 kg), SpO2 100 %.  General appearance: alert and no distress Neck: no adenopathy, no JVD, supple, symmetrical, trachea midline, thyroid not enlarged, symmetric, no tenderness/mass/nodules and Bilateral carotid bruits Lungs: clear to auscultation bilaterally Heart: regular rate and rhythm, S1, S2 normal, no murmur, click, rub or gallop Extremities: extremities normal, atraumatic, no cyanosis or edema and Erythematous left forefoot  EKG not performed today  ASSESSMENT AND PLAN:   Claudication in peripheral vascular disease [facility]) Thomas Snow returns for follow-up  of his Doppler studies performed 05/08/16. His left ABI was 0.3. He does have an occluded left SFA. He has chronic pain in his left foot as well as lifestyle limiting claudication. His serum creatinine is 1.4/checked 3 months ago. He wishes to pursue attempt at endovascular therapy. We will admit him the day before the procedure for hydration. We'll stop his Xarelto  3 days prior to admission and plan on doing left lower extremity angiography and potential percutaneous  intervention.      Lorretta Harp MD FACP,FACC,FAHA, [facility] 05/16/2016 10:56 AM

## 2016-05-16 NOTE — Progress Notes (Signed)
Thank you :)

## 2016-05-16 NOTE — Patient Instructions (Addendum)
   [facility] [address] [place] [address] Dept: [phone] Loc: [place]  05/16/2016  You are scheduled for a Peripheral Angiogram on Monday, June 4 with Dr. Quay Burow.  1. Please arrive at the [facility] (Main Entrance A) at [facility]: [address] [place], [place] [address] at 11:30 AM (two hours before your procedure to ensure your preparation). Free valet parking service is available. Someone from from Bed Control will call you sometime in the afternoon/evening of June 3 to let you know when a bed is available. You will be admitted that day for Pre-hydration   Special note: Every effort is made to have your procedure done on time. Please understand that emergencies sometimes delay scheduled procedures.  2. Diet: Do not eat or drink anything after midnight prior to your procedure except sips of water to take medications.  3. Labs: You will need to have blood drawn on Monday, May 28 at [place], [place]  Open: [place] (Lunch 12:30 - 1:30)   Phone: [phone]. You do not need to be fasting.  4. Medication instructions in preparation for your procedure:  Stop taking Xarelto (Rivaroxaban) on Friday, June 1.  On the morning of your procedure, take any morning medicines NOT listed above.  You may use sips of water.  5. Plan for one night stay--bring personal belongings. 6. Bring a current list of your medications and current insurance cards. 7. You MUST have a responsible person to drive you home. 8. Someone MUST be with you the first 24 hours after you arrive home or your discharge will be delayed. 9. Please wear clothes that are easy to get on and off and wear slip-on shoes.  Thank you for allowing [place] to care for you!   -- [facility]   Other Testing:  Your physician has  requested that you have a carotid duplex in June. This test is an ultrasound of the carotid arteries in your neck. It looks at blood flow through these arteries that supply the brain with blood. Allow one hour for this exam. There are no restrictions or special instructions.

## 2016-05-16 NOTE — Assessment & Plan Note (Signed)
Thomas Snow returns for follow-up of his Doppler studies performed 05/08/16. His left ABI was 0.3. He does have an occluded left SFA. He has chronic pain in his left foot as well as lifestyle limiting claudication. His serum creatinine is 1.4/checked 3 months ago. He wishes to pursue attempt at endovascular therapy. We will admit him the day before the procedure for hydration. We'll stop his Xarelto  3 days prior to admission and plan on doing left lower extremity angiography and potential percutaneous intervention.

## 2016-05-30 ENCOUNTER — Ambulatory Visit
Admission: RE | Admit: 2016-05-30 | Discharge: 2016-05-30 | Disposition: A | Discharge status: Home / Self Care | Payer: PPO | Source: Ambulatory Visit | Attending: Cardiovascular Disease | Admitting: Cardiovascular Disease

## 2016-05-30 ENCOUNTER — Other Ambulatory Visit: Payer: Self-pay | Admitting: Cardiovascular Disease

## 2016-05-30 DIAGNOSIS — Z01818 Encounter for other preprocedural examination: Secondary | ICD-10-CM | POA: Diagnosis not present

## 2016-05-30 DIAGNOSIS — I10 Essential (primary) hypertension: Secondary | ICD-10-CM | POA: Diagnosis not present

## 2016-05-30 DIAGNOSIS — I739 Peripheral vascular disease, unspecified: Secondary | ICD-10-CM | POA: Diagnosis not present

## 2016-05-30 DIAGNOSIS — I2510 Atherosclerotic heart disease of native coronary artery without angina pectoris: Secondary | ICD-10-CM | POA: Diagnosis not present

## 2016-05-30 DIAGNOSIS — Z7901 Long term (current) use of anticoagulants: Secondary | ICD-10-CM | POA: Diagnosis not present

## 2016-05-30 LAB — CBC WITH DIFFERENTIAL/PLATELET
BASOS PCT: 1 %
Basophils Absolute: 82 cells/uL (ref 0–200)
EOS PCT: 1 %
Eosinophils Absolute: 82 cells/uL (ref 15–500)
HCT: 44.8 % (ref 38.5–50.0)
Hemoglobin: 14.8 g/dL (ref 13.2–17.1)
Lymphocytes Relative: 20 %
Lymphs Abs: 1640 cells/uL (ref 850–3900)
MCH: 30.1 pg (ref 27.0–33.0)
MCHC: 33 g/dL (ref 32.0–36.0)
MCV: 91.2 fL (ref 80.0–100.0)
MONOS PCT: 8 %
MPV: 9.2 fL (ref 7.5–12.5)
Monocytes Absolute: 656 cells/uL (ref 200–950)
NEUTROS ABS: 5740 {cells}/uL (ref 1500–7800)
Neutrophils Relative %: 70 %
PLATELETS: 276 10*3/uL (ref 140–400)
RBC: 4.91 MIL/uL (ref 4.20–5.80)
RDW: 13.5 % (ref 11.0–15.0)
WBC: 8.2 10*3/uL (ref 3.8–10.8)

## 2016-05-31 LAB — BASIC METABOLIC PANEL WITH GFR
BUN: 17 mg/dL (ref 7–25)
CALCIUM: 9.5 mg/dL (ref 8.6–10.3)
CO2: 28 mmol/L (ref 20–31)
CREATININE: 1.6 mg/dL — AB (ref 0.70–1.11)
Chloride: 104 mmol/L (ref 98–110)
GFR, EST AFRICAN AMERICAN: 46 mL/min — AB (ref >=60)
GFR, EST NON AFRICAN AMERICAN: 40 mL/min — AB (ref >=60)
Glucose, Bld: 128 mg/dL — ABNORMAL HIGH (ref 65–99)
Potassium: 5.5 mmol/L — ABNORMAL HIGH (ref 3.5–5.3)
SODIUM: 142 mmol/L (ref 135–146)

## 2016-05-31 LAB — PROTIME-INR
INR: 1
Prothrombin Time: 10.8 s (ref 9.0–11.5)

## 2016-05-31 LAB — TSH: TSH: 3.1 m[IU]/L (ref 0.40–4.50)

## 2016-05-31 LAB — APTT: APTT: 26 s (ref 22–34)

## 2016-06-01 ENCOUNTER — Telehealth: Payer: Self-pay | Admitting: Cardiovascular Disease

## 2016-06-01 NOTE — Telephone Encounter (Signed)
Patient wife, calling states that she is confused about instructions for Carotid. Patient wife states that she was told patient needs to have a kidney flush and would like to verify if it will be on Sunday or Monday.   Patient will not be available from 1:30pm-5pm. Thanks.

## 2016-06-01 NOTE — Telephone Encounter (Signed)
Attempted to reach pt again. No answer. Left message on vm.

## 2016-06-01 NOTE — Telephone Encounter (Signed)
Pt wife wanted to confirm plan for admit 06/03 for procedure 06/04.  Contacted bed control, they will call the pt on 06/03 when bed available. Left msg for pt with this info.  Lenoard Aden 06/01/2016 6:35 PM Beeper [phone]

## 2016-06-01 NOTE — Telephone Encounter (Signed)
Spoke with pt wife, patient is scheduled for a procedure on Monday but was told to come to the hospital on Sunday for hydration. His sister has passed away and her service will be Sunday at 3 pm. They are wondering what the time line is for the admission on Sunday, they may have to reschedule the procedure. Will forward to taylor, dr berry's ma to call and discuss with the wife.

## 2016-06-01 NOTE — Telephone Encounter (Signed)
Left detailed message on home voicemail stating that I am not sure when they will call him the day before. It would just be when they had a bed available. Asked pt to call back to discuss further. May need to reschedule procedure if pt is agreeable.

## 2016-06-01 NOTE — Telephone Encounter (Signed)
Routed to Dr. Gwenlyn Found as Juluis Rainier

## 2016-06-03 ENCOUNTER — Ambulatory Visit (HOSPITAL_COMMUNITY)
Admission: RE | Admit: 2016-06-03 | Discharge: 2016-06-05 | Disposition: A | Discharge status: Home / Self Care | Payer: PPO | Source: Ambulatory Visit | Attending: Cardiovascular Disease | Admitting: Cardiovascular Disease

## 2016-06-03 DIAGNOSIS — I70222 Atherosclerosis of native arteries of extremities with rest pain, left leg: Secondary | ICD-10-CM | POA: Insufficient documentation

## 2016-06-03 DIAGNOSIS — I2510 Atherosclerotic heart disease of native coronary artery without angina pectoris: Secondary | ICD-10-CM | POA: Insufficient documentation

## 2016-06-03 DIAGNOSIS — N184 Chronic kidney disease, stage 4 (severe): Secondary | ICD-10-CM | POA: Diagnosis not present

## 2016-06-03 DIAGNOSIS — Z7901 Long term (current) use of anticoagulants: Secondary | ICD-10-CM | POA: Diagnosis not present

## 2016-06-03 DIAGNOSIS — Z950 Presence of cardiac pacemaker: Secondary | ICD-10-CM | POA: Diagnosis not present

## 2016-06-03 DIAGNOSIS — I739 Peripheral vascular disease, unspecified: Secondary | ICD-10-CM | POA: Diagnosis not present

## 2016-06-03 DIAGNOSIS — Z720 Tobacco use: Secondary | ICD-10-CM | POA: Diagnosis not present

## 2016-06-03 DIAGNOSIS — I7092 Chronic total occlusion of artery of the extremities: Secondary | ICD-10-CM | POA: Insufficient documentation

## 2016-06-03 DIAGNOSIS — I998 Other disorder of circulatory system: Secondary | ICD-10-CM | POA: Diagnosis present

## 2016-06-03 DIAGNOSIS — I70229 Atherosclerosis of native arteries of extremities with rest pain, unspecified extremity: Secondary | ICD-10-CM | POA: Diagnosis present

## 2016-06-03 DIAGNOSIS — Z955 Presence of coronary angioplasty implant and graft: Secondary | ICD-10-CM | POA: Insufficient documentation

## 2016-06-03 DIAGNOSIS — G8929 Other chronic pain: Secondary | ICD-10-CM | POA: Diagnosis not present

## 2016-06-03 DIAGNOSIS — I480 Paroxysmal atrial fibrillation: Secondary | ICD-10-CM | POA: Insufficient documentation

## 2016-06-03 DIAGNOSIS — I129 Hypertensive chronic kidney disease with stage 1 through stage 4 chronic kidney disease, or unspecified chronic kidney disease: Secondary | ICD-10-CM | POA: Diagnosis not present

## 2016-06-03 HISTORY — DX: Personal history of urinary calculi: Z87.442

## 2016-06-03 HISTORY — DX: Other chronic pain: G89.29

## 2016-06-03 HISTORY — DX: Presence of automatic (implantable) cardiac defibrillator: Z95.810

## 2016-06-03 HISTORY — DX: Gastro-esophageal reflux disease without esophagitis: K21.9

## 2016-06-03 HISTORY — DX: Unspecified dementia, unspecified severity, without behavioral disturbance, psychotic disturbance, mood disturbance, and anxiety: F03.90

## 2016-06-03 HISTORY — DX: Hesitancy of micturition: R39.11

## 2016-06-03 HISTORY — DX: Low back pain, unspecified: M54.50

## 2016-06-03 HISTORY — DX: Low back pain: M54.5

## 2016-06-03 MED ORDER — OMEGA-3-ACID ETHYL ESTERS 1 G PO CAPS
1.0000 g | ORAL_CAPSULE | Freq: Every day | ORAL | Status: DC
  Administered 2016-06-04 – 2016-06-05 (×2): 1 g via ORAL
  Filled 2016-06-03 (×2): qty 1

## 2016-06-03 MED ORDER — OMEGA-3 FATTY ACIDS 1000 MG PO CAPS: 1.0000 g | ORAL_CAPSULE | Freq: Every day | ORAL | Status: DC

## 2016-06-03 MED ORDER — CILOSTAZOL 100 MG PO TABS
50.0000 mg | ORAL_TABLET | Freq: Two times a day (BID) | ORAL | Status: DC
  Administered 2016-06-03 – 2016-06-05 (×4): 50 mg via ORAL
  Filled 2016-06-03 (×5): qty 1

## 2016-06-03 MED ORDER — ONDANSETRON HCL 4 MG/2ML IJ SOLN: 4.0000 mg | Freq: Four times a day (QID) | INTRAMUSCULAR | Status: DC | PRN

## 2016-06-03 MED ORDER — ASPIRIN EC 81 MG PO TBEC
81.0000 mg | DELAYED_RELEASE_TABLET | Freq: Every day | ORAL | Status: DC
  Filled 2016-06-03: qty 1

## 2016-06-03 MED ORDER — ACETAMINOPHEN 325 MG PO TABS: 650.0000 mg | ORAL_TABLET | ORAL | Status: DC | PRN

## 2016-06-03 MED ORDER — NITROGLYCERIN 0.4 MG SL SUBL: 0.4000 mg | SUBLINGUAL_TABLET | SUBLINGUAL | Status: DC | PRN

## 2016-06-03 MED ORDER — SODIUM CHLORIDE 0.9 % IV SOLN
INTRAVENOUS | Status: DC
  Administered 2016-06-03: 20:00:00 via INTRAVENOUS

## 2016-06-03 MED ORDER — DONEPEZIL HCL 10 MG PO TABS
10.0000 mg | ORAL_TABLET | Freq: Every day | ORAL | Status: DC
  Administered 2016-06-04 – 2016-06-05 (×2): 10 mg via ORAL
  Filled 2016-06-03 (×3): qty 1

## 2016-06-03 NOTE — H&P (Signed)
05/16/2016 CJ EDGELL   1934-10-19  [id number]  Primary Physician Cyndi Bender, PA-C Primary Cardiologist: Lorretta Harp MD Renae Gloss  HPI:  [name] is an 82 year old thin and frail appearing Caucasian male accompanied by his wife [name]. The father of 4 children. He is seen by Dr. Sallyanne Kuster. I last saw him in the office 04/20/16. He has a history of peripheral arterial disease and coronary artery disease. He has a history of coronary intervention back in 2001, remote left subclavian stenting, pacemaker implantation with paroxysmal atrial fibrillation maintaining sinus rhythm on the Route oral anticoagulation. He does have a serum creatinine in the mid 1 range. ABIs performed regularly revealed a left ABI 0.35. He complains of chronic redness and pain in his left foot as well as left calf claudication. Dopplers performed 05/08/16 revealed a left ABI 0.3 with an occluded left SFA.         Current Outpatient Prescriptions  Medication Sig Dispense Refill  . acetaminophen (TYLENOL) 325 MG tablet Take 650 mg by mouth 3 (three) times daily.    . cilostazol (PLETAL) 50 MG tablet Take 1 tablet (50 mg total) by mouth 2 (two) times daily. 180 tablet 3  . donepezil (ARICEPT) 10 MG tablet Take 10 mg by mouth every morning.    . fish oil-omega-3 fatty acids 1000 MG capsule Take 1 g by mouth daily.    Alveda Reasons 20 MG TABS tablet Take 1 tablet by mouth at bedtime.     No current facility-administered medications for this visit.          Allergies  Allergen Reactions  . Atorvastatin     Leg pain  . Crestor [Rosuvastatin]     Leg pain    Social History        Social History  . Marital status: Married    Spouse name: N/A  . Number of children: N/A  . Years of education: N/A      Occupational History  . Not on file.        Social History Main Topics  . Smoking status: Former Smoker    Packs/day: 0.50    Years: 25.00     Types: Cigarettes    Quit date: 08/19/1996  . Smokeless tobacco: Current User    Types: Snuff  . Alcohol use No  . Drug use: No  . Sexual activity: Not on file       Other Topics Concern  . Not on file      Social History Narrative  . No narrative on file     Review of Systems: General: negative for chills, fever, night sweats or weight changes.  Cardiovascular: negative for chest pain, dyspnea on exertion, edema, orthopnea, palpitations, paroxysmal nocturnal dyspnea or shortness of breath Dermatological: negative for rash Respiratory: negative for cough or wheezing Urologic: negative for hematuria Abdominal: negative for nausea, vomiting, diarrhea, bright red blood per rectum, melena, or hematemesis Neurologic: negative for visual changes, syncope, or dizziness All other systems reviewed and are otherwise negative except as noted above.    Blood pressure 124/67, pulse 64, height 5\' 6"  (1.676 m), weight 100 lb 12.8 oz (45.7 kg), SpO2 100 %.  General appearance: alert and no distress Neck: no adenopathy, no JVD, supple, symmetrical, trachea midline, thyroid not enlarged, symmetric, no tenderness/mass/nodules and Bilateral carotid bruits Lungs: clear to auscultation bilaterally Heart: regular rate and rhythm, S1, S2 normal, no murmur, click, rub or gallop Extremities: extremities normal,  atraumatic, no cyanosis or edema and Erythematous left forefoot  EKG not performed today  ASSESSMENT AND PLAN:   Claudication in peripheral vascular disease [facility]) Mr. Willert returns for follow-up of his Doppler studies performed 05/08/16. His left ABI was 0.3. He does have an occluded left SFA. He has chronic pain in his left foot as well as lifestyle limiting claudication. His serum creatinine is 1.4/checked 3 months ago. He wishes to pursue attempt at endovascular therapy. We will admit him the day before the procedure for hydration. We'll stop his Xarelto  3 days prior to  admission and plan on doing left lower extremity angiography and potential percutaneous intervention.      Lorretta Harp MD FACP,FACC,FAHA, [facility] 05/16/2016 10:56 AM    Electronically signed by Lorretta Harp, MD at 05/16/2016 11:17 AM

## 2016-06-04 ENCOUNTER — Encounter (HOSPITAL_COMMUNITY)
Admission: RE | Disposition: A | Discharge status: Home / Self Care | Payer: Self-pay | Source: Ambulatory Visit | Attending: Cardiovascular Disease

## 2016-06-04 ENCOUNTER — Encounter (HOSPITAL_COMMUNITY): Payer: Self-pay | Admitting: General Practice

## 2016-06-04 DIAGNOSIS — N184 Chronic kidney disease, stage 4 (severe): Secondary | ICD-10-CM

## 2016-06-04 DIAGNOSIS — I70212 Atherosclerosis of native arteries of extremities with intermittent claudication, left leg: Secondary | ICD-10-CM | POA: Diagnosis not present

## 2016-06-04 DIAGNOSIS — I70222 Atherosclerosis of native arteries of extremities with rest pain, left leg: Secondary | ICD-10-CM | POA: Diagnosis not present

## 2016-06-04 DIAGNOSIS — G8929 Other chronic pain: Secondary | ICD-10-CM | POA: Diagnosis not present

## 2016-06-04 DIAGNOSIS — Z950 Presence of cardiac pacemaker: Secondary | ICD-10-CM | POA: Diagnosis not present

## 2016-06-04 DIAGNOSIS — Z955 Presence of coronary angioplasty implant and graft: Secondary | ICD-10-CM | POA: Diagnosis not present

## 2016-06-04 DIAGNOSIS — I70229 Atherosclerosis of native arteries of extremities with rest pain, unspecified extremity: Secondary | ICD-10-CM | POA: Diagnosis present

## 2016-06-04 DIAGNOSIS — Z720 Tobacco use: Secondary | ICD-10-CM | POA: Diagnosis not present

## 2016-06-04 DIAGNOSIS — I480 Paroxysmal atrial fibrillation: Secondary | ICD-10-CM | POA: Diagnosis not present

## 2016-06-04 DIAGNOSIS — I2510 Atherosclerotic heart disease of native coronary artery without angina pectoris: Secondary | ICD-10-CM | POA: Diagnosis not present

## 2016-06-04 DIAGNOSIS — Z7901 Long term (current) use of anticoagulants: Secondary | ICD-10-CM | POA: Diagnosis not present

## 2016-06-04 DIAGNOSIS — I998 Other disorder of circulatory system: Secondary | ICD-10-CM | POA: Diagnosis present

## 2016-06-04 DIAGNOSIS — I129 Hypertensive chronic kidney disease with stage 1 through stage 4 chronic kidney disease, or unspecified chronic kidney disease: Secondary | ICD-10-CM | POA: Diagnosis not present

## 2016-06-04 DIAGNOSIS — I7092 Chronic total occlusion of artery of the extremities: Secondary | ICD-10-CM | POA: Diagnosis not present

## 2016-06-04 DIAGNOSIS — I739 Peripheral vascular disease, unspecified: Secondary | ICD-10-CM

## 2016-06-04 HISTORY — PX: PERIPHERAL VASCULAR INTERVENTION: CATH118257

## 2016-06-04 HISTORY — PX: LOWER EXTREMITY INTERVENTION: CATH118252

## 2016-06-04 LAB — URINALYSIS, ROUTINE W REFLEX MICROSCOPIC
BILIRUBIN URINE: NEGATIVE
Bacteria, UA: NONE SEEN
GLUCOSE, UA: NEGATIVE mg/dL
Ketones, ur: NEGATIVE mg/dL
LEUKOCYTES UA: NEGATIVE
Nitrite: NEGATIVE
PROTEIN: NEGATIVE mg/dL
Specific Gravity, Urine: 1.033 — ABNORMAL HIGH (ref 1.005–1.030)
WBC, UA: NONE SEEN WBC/hpf (ref 0–5)
pH: 7 (ref 5.0–8.0)

## 2016-06-04 LAB — BASIC METABOLIC PANEL
Anion gap: 6 (ref 5–15)
BUN: 17 mg/dL (ref 6–20)
CO2: 30 mmol/L (ref 22–32)
Calcium: 8.3 mg/dL — ABNORMAL LOW (ref 8.9–10.3)
Chloride: 103 mmol/L (ref 101–111)
Creatinine, Ser: 1.56 mg/dL — ABNORMAL HIGH (ref 0.61–1.24)
GFR calc Af Amer: 47 mL/min — ABNORMAL LOW (ref >60)
GFR calc non Af Amer: 40 mL/min — ABNORMAL LOW (ref >60)
Glucose, Bld: 138 mg/dL — ABNORMAL HIGH (ref 65–99)
Potassium: 3.6 mmol/L (ref 3.5–5.1)
Sodium: 139 mmol/L (ref 135–145)

## 2016-06-04 LAB — POCT ACTIVATED CLOTTING TIME
ACTIVATED CLOTTING TIME: 246 s
ACTIVATED CLOTTING TIME: 257 s
Activated Clotting Time: 235 seconds

## 2016-06-04 SURGERY — LOWER EXTREMITY INTERVENTION
Anesthesia: LOCAL

## 2016-06-04 MED ORDER — HEPARIN SODIUM (PORCINE) 1000 UNIT/ML IJ SOLN
INTRAMUSCULAR | Status: DC | PRN
  Administered 2016-06-04: 2000 [IU] via INTRAVENOUS
  Administered 2016-06-04: 7000 [IU] via INTRAVENOUS

## 2016-06-04 MED ORDER — SODIUM CHLORIDE 0.9 % IV SOLN: INTRAVENOUS | Status: DC

## 2016-06-04 MED ORDER — IODIXANOL 320 MG/ML IV SOLN
INTRAVENOUS | Status: DC | PRN
  Administered 2016-06-04: 160 mL via INTRA_ARTERIAL

## 2016-06-04 MED ORDER — ACETAMINOPHEN 325 MG PO TABS
650.0000 mg | ORAL_TABLET | ORAL | Status: DC | PRN
  Administered 2016-06-04 – 2016-06-05 (×3): 650 mg via ORAL
  Filled 2016-06-04 (×3): qty 2

## 2016-06-04 MED ORDER — CLOPIDOGREL BISULFATE 300 MG PO TABS
ORAL_TABLET | ORAL | Status: DC | PRN
  Administered 2016-06-04: 300 mg via ORAL

## 2016-06-04 MED ORDER — FENTANYL CITRATE (PF) 100 MCG/2ML IJ SOLN
INTRAMUSCULAR | Status: DC | PRN
  Administered 2016-06-04: 25 ug via INTRAVENOUS

## 2016-06-04 MED ORDER — ONDANSETRON HCL 4 MG/2ML IJ SOLN: 4.0000 mg | Freq: Four times a day (QID) | INTRAMUSCULAR | Status: DC | PRN

## 2016-06-04 MED ORDER — PHENAZOPYRIDINE HCL 100 MG PO TABS
100.0000 mg | ORAL_TABLET | Freq: Three times a day (TID) | ORAL | Status: DC
  Administered 2016-06-04 – 2016-06-05 (×2): 100 mg via ORAL
  Filled 2016-06-04 (×3): qty 1

## 2016-06-04 MED ORDER — ASPIRIN 81 MG PO CHEW
81.0000 mg | CHEWABLE_TABLET | ORAL | Status: AC
  Administered 2016-06-04: 81 mg via ORAL
  Filled 2016-06-04: qty 1

## 2016-06-04 MED ORDER — FENTANYL CITRATE (PF) 100 MCG/2ML IJ SOLN
INTRAMUSCULAR | Status: AC
  Filled 2016-06-04: qty 2

## 2016-06-04 MED ORDER — HEPARIN (PORCINE) IN NACL 2-0.9 UNIT/ML-% IJ SOLN
INTRAMUSCULAR | Status: AC
  Filled 2016-06-04: qty 1000

## 2016-06-04 MED ORDER — ANGIOPLASTY BOOK
Freq: Once | Status: AC
  Administered 2016-06-04: 22:00:00
  Filled 2016-06-04: qty 1

## 2016-06-04 MED ORDER — PHENAZOPYRIDINE HCL 200 MG PO TABS: 200.0000 mg | ORAL_TABLET | Freq: Three times a day (TID) | ORAL | Status: DC

## 2016-06-04 MED ORDER — HYDRALAZINE HCL 20 MG/ML IJ SOLN
10.0000 mg | INTRAMUSCULAR | Status: DC | PRN
  Filled 2016-06-04: qty 1

## 2016-06-04 MED ORDER — ASPIRIN EC 81 MG PO TBEC
81.0000 mg | DELAYED_RELEASE_TABLET | Freq: Every day | ORAL | Status: DC
  Administered 2016-06-05: 08:00:00 81 mg via ORAL
  Filled 2016-06-04 (×2): qty 1

## 2016-06-04 MED ORDER — HEPARIN SODIUM (PORCINE) 1000 UNIT/ML IJ SOLN
INTRAMUSCULAR | Status: AC
  Filled 2016-06-04: qty 1

## 2016-06-04 MED ORDER — SODIUM CHLORIDE 0.9 % WEIGHT BASED INFUSION: 3.0000 mL/kg/h | INTRAVENOUS | Status: DC

## 2016-06-04 MED ORDER — ASPIRIN 81 MG PO CHEW: 81.0000 mg | CHEWABLE_TABLET | ORAL | Status: DC

## 2016-06-04 MED ORDER — DOCUSATE SODIUM 100 MG PO CAPS
200.0000 mg | ORAL_CAPSULE | Freq: Every day | ORAL | Status: DC
  Administered 2016-06-04 – 2016-06-05 (×2): 200 mg via ORAL
  Filled 2016-06-04 (×2): qty 2

## 2016-06-04 MED ORDER — SODIUM CHLORIDE 0.9 % IV SOLN: INTRAVENOUS | Status: AC

## 2016-06-04 MED ORDER — LIDOCAINE HCL 1 % IJ SOLN
INTRAMUSCULAR | Status: AC
  Filled 2016-06-04: qty 40

## 2016-06-04 MED ORDER — LIDOCAINE HCL (PF) 1 % IJ SOLN
INTRAMUSCULAR | Status: DC | PRN
  Administered 2016-06-04: 20 mL

## 2016-06-04 MED ORDER — DEXTROSE 5 % IV SOLN
1.0000 g | INTRAVENOUS | Status: DC
  Filled 2016-06-04: qty 10

## 2016-06-04 MED ORDER — CLOPIDOGREL BISULFATE 300 MG PO TABS
ORAL_TABLET | ORAL | Status: AC
  Filled 2016-06-04: qty 1

## 2016-06-04 MED ORDER — MORPHINE SULFATE (PF) 4 MG/ML IV SOLN
2.0000 mg | INTRAVENOUS | Status: DC | PRN
  Administered 2016-06-05: 2 mg via INTRAVENOUS
  Filled 2016-06-04: qty 1

## 2016-06-04 MED ORDER — SODIUM CHLORIDE 0.9 % WEIGHT BASED INFUSION
3.0000 mL/kg/h | INTRAVENOUS | Status: DC
  Administered 2016-06-04: 3 mL/kg/h via INTRAVENOUS

## 2016-06-04 MED ORDER — HEPARIN (PORCINE) IN NACL 2-0.9 UNIT/ML-% IJ SOLN
INTRAMUSCULAR | Status: AC | PRN
  Administered 2016-06-04: 1000 mL

## 2016-06-04 MED ORDER — CLOPIDOGREL BISULFATE 75 MG PO TABS
75.0000 mg | ORAL_TABLET | Freq: Every day | ORAL | Status: DC
  Administered 2016-06-05: 08:00:00 75 mg via ORAL
  Filled 2016-06-04: qty 1

## 2016-06-04 MED ORDER — SODIUM CHLORIDE 0.9 % WEIGHT BASED INFUSION: 1.0000 mL/kg/h | INTRAVENOUS | Status: DC

## 2016-06-04 MED ORDER — SODIUM CHLORIDE 0.9% FLUSH: 3.0000 mL | INTRAVENOUS | Status: DC | PRN

## 2016-06-04 SURGICAL SUPPLY — 34 items
BALLN COYOTE OTW 2.5X220X150 (BALLOONS) ×3
BALLN IN.PACT DCB 4X150 (BALLOONS) ×3
BALLN IN.PACT DCB 5X150 (BALLOONS) ×6
BALLN MUSTANG 5X200X135 (BALLOONS) ×3
BALLOON COYOTE OTW 2.5X220X150 (BALLOONS) IMPLANT
BALLOON MUSTANG 5X200X135 (BALLOONS) IMPLANT
CATH ANGIO 5F PIGTAIL 65CM (CATHETERS) ×1 IMPLANT
CATH CROSS OVER TEMPO 5F (CATHETERS) ×1 IMPLANT
CATH HAWKONE LX EXTENDED TIP (CATHETERS) ×1 IMPLANT
CATH STRAIGHT 5FR 65CM (CATHETERS) ×1 IMPLANT
CATH VIANCE CROSS STAND 150CM (MICROCATHETER) ×3
CATH VIANCE CROSS STD 150CM (MICROCATHETER) IMPLANT
DCB IN.PACT 4X150 (BALLOONS) IMPLANT
DCB IN.PACT 5X150 (BALLOONS) IMPLANT
DEVICE CONTINUOUS FLUSH (MISCELLANEOUS) ×1 IMPLANT
DEVICE SPIDERFX EMB PROT 5MM (WIRE) ×1 IMPLANT
DEVICE TORQUE .014-.018 (MISCELLANEOUS) IMPLANT
GUIDEWIRE ANGLED .035X150CM (WIRE) ×1 IMPLANT
KIT ENCORE 26 ADVANTAGE (KITS) ×1 IMPLANT
KIT PV (KITS) ×3 IMPLANT
SHEATH HIGHFLEX ANSEL 7FR 55CM (SHEATH) ×1 IMPLANT
SHEATH PINNACLE 5F 10CM (SHEATH) ×1 IMPLANT
SHEATH PINNACLE 7F 10CM (SHEATH) ×1 IMPLANT
STENT VIABAHN 5X250X120 (Permanent Stent) ×1 IMPLANT
STENT VIABAHN 6X250X120 (Permanent Stent) ×1 IMPLANT
STOPCOCK MORSE 400PSI 3WAY (MISCELLANEOUS) ×1 IMPLANT
SYRINGE MEDRAD AVANTA MACH 7 (SYRINGE) ×1 IMPLANT
TAPE RADIOPAQUE TURBO (MISCELLANEOUS) ×2 IMPLANT
TORQUE DEVICE .014-.018 (MISCELLANEOUS) ×3
TRANSDUCER W/STOPCOCK (MISCELLANEOUS) ×3 IMPLANT
TRAY PV CATH (CUSTOM PROCEDURE TRAY) ×3 IMPLANT
TUBING CIL FLEX 10 FLL-RA (TUBING) ×1 IMPLANT
WIRE HITORQ VERSACORE ST 145CM (WIRE) ×1 IMPLANT
WIRE SPARTACORE .014X300CM (WIRE) ×1 IMPLANT

## 2016-06-04 NOTE — Care Management Note (Signed)
Case Management Note  Patient Details  Name: Thomas Snow MRN: [id number] Date of Birth: 1934/10/16  Subjective/Objective:   From home, s/p pv intervention, will be on plavix.  PCP Cyndi Bender                   Action/Plan: NCM will follow for dc needs.    Expected Discharge Date:                  Expected Discharge Plan:  Home/Self Care  In-House Referral:     Discharge planning Services  CM Consult  Post Acute Care Choice:    Choice offered to:     DME Arranged:    DME Agency:     HH Arranged:    [place] Agency:     Status of Service:  Completed, signed off  If discussed at [employer] of Stay Meetings, dates discussed:    Additional Comments:  Zenon Mayo, RN 06/04/2016, 4:29 PM

## 2016-06-04 NOTE — Progress Notes (Signed)
Pt and wife state pt is c/o dysuria. Reported to myself by Dina Rich RN. NP/PA [place] ordered rocephin iv. After urine collection paged Fransico Him to report UA is negative, and pyridium ordered. Will continue to monitor for improvement.

## 2016-06-04 NOTE — H&P (View-Only) (Signed)
05/16/2016 Thomas Snow   1934-04-02  [id number]  Primary Physician Cyndi Bender, PA-C Primary Cardiologist: Lorretta Harp MD Renae Gloss  HPI:  [name] is an 82 year old thin and frail appearing Caucasian male accompanied by his wife Thomas Snow. The father of 4 children. He is seen by Dr. Sallyanne Kuster. I last saw him in the office 04/20/16. He has a history of peripheral arterial disease and coronary artery disease. He has a history of coronary intervention back in 2001, remote left subclavian stenting, pacemaker implantation with paroxysmal atrial fibrillation maintaining sinus rhythm on the Route oral anticoagulation. He does have a serum creatinine in the mid 1 range. ABIs performed regularly revealed a left ABI 0.35. He complains of chronic redness and pain in his left foot as well as left calf claudication. Dopplers performed 05/08/16 revealed a left ABI 0.3 with an occluded left SFA.   Current Outpatient Prescriptions  Medication Sig Dispense Refill  . acetaminophen (TYLENOL) 325 MG tablet Take 650 mg by mouth 3 (three) times daily.    . cilostazol (PLETAL) 50 MG tablet Take 1 tablet (50 mg total) by mouth 2 (two) times daily. 180 tablet 3  . donepezil (ARICEPT) 10 MG tablet Take 10 mg by mouth every morning.    . fish oil-omega-3 fatty acids 1000 MG capsule Take 1 g by mouth daily.    Thomas Snow 20 MG TABS tablet Take 1 tablet by mouth at bedtime.     No current facility-administered medications for this visit.     Allergies  Allergen Reactions  . Atorvastatin     Leg pain  . Crestor [Rosuvastatin]     Leg pain    Social History   Social History  . Marital status: Married    Spouse name: N/A  . Number of children: N/A  . Years of education: N/A   Occupational History  . Not on file.   Social History Main Topics  . Smoking status: Former Smoker    Packs/day: 0.50    Years: 25.00    Types: Cigarettes    Quit date: 08/19/1996  . Smokeless  tobacco: Current User    Types: Snuff  . Alcohol use No  . Drug use: No  . Sexual activity: Not on file   Other Topics Concern  . Not on file   Social History Narrative  . No narrative on file     Review of Systems: General: negative for chills, fever, night sweats or weight changes.  Cardiovascular: negative for chest pain, dyspnea on exertion, edema, orthopnea, palpitations, paroxysmal nocturnal dyspnea or shortness of breath Dermatological: negative for rash Respiratory: negative for cough or wheezing Urologic: negative for hematuria Abdominal: negative for nausea, vomiting, diarrhea, bright red blood per rectum, melena, or hematemesis Neurologic: negative for visual changes, syncope, or dizziness All other systems reviewed and are otherwise negative except as noted above.    Blood pressure 124/67, pulse 64, height 5\' 6"  (1.676 m), weight 100 lb 12.8 oz (45.7 kg), SpO2 100 %.  General appearance: alert and no distress Neck: no adenopathy, no JVD, supple, symmetrical, trachea midline, thyroid not enlarged, symmetric, no tenderness/mass/nodules and Bilateral carotid bruits Lungs: clear to auscultation bilaterally Heart: regular rate and rhythm, S1, S2 normal, no murmur, click, rub or gallop Extremities: extremities normal, atraumatic, no cyanosis or edema and Erythematous left forefoot  EKG not performed today  ASSESSMENT AND PLAN:   Claudication in peripheral vascular disease Thomas Snow) Mr. Kempton returns for follow-up  of his Doppler studies performed 05/08/16. His left ABI was 0.3. He does have an occluded left SFA. He has chronic pain in his left foot as well as lifestyle limiting claudication. His serum creatinine is 1.4/checked 3 months ago. He wishes to pursue attempt at endovascular therapy. We will admit him the day before the procedure for hydration. We'll stop his Xarelto  3 days prior to admission and plan on doing left lower extremity angiography and potential percutaneous  intervention.      Lorretta Harp MD FACP,FACC,FAHA, [facility] 05/16/2016 10:56 AM

## 2016-06-04 NOTE — Progress Notes (Signed)
   Progress Note  Patient Name: Thomas Snow Date of Encounter: 06/04/2016  Primary Cardiologist: Gwenlyn Found  Subjective   No complaints this morning.   Inpatient Medications    Scheduled Meds: . aspirin EC  81 mg Oral Daily  . cilostazol  50 mg Oral BID  . donepezil  10 mg Oral Daily  . omega-3 acid ethyl esters  1 g Oral Daily   Continuous Infusions: . sodium chloride    . sodium chloride Stopped (06/04/16 0555)  . sodium chloride 1 mL/kg/hr (06/04/16 0651)   PRN Meds: acetaminophen, nitroGLYCERIN, ondansetron (ZOFRAN) IV, sodium chloride flush   Vital Signs    Vitals:   06/03/16 1936 06/03/16 1955 06/03/16 1957 06/04/16 0358  BP:  140/60 (!) 153/49 (!) 134/51  Pulse:  62  70  Resp:  18  18  Temp:  98.5 F (36.9 C)  97.9 F (36.6 C)  TempSrc:  Oral  Oral  SpO2:  100%  100%  Weight:      Height: 5\' 6"  (1.676 m)      No intake or output data in the 24 hours ending 06/04/16 1022 Filed Weights   06/03/16 1753  Weight: 97 lb 10.6 oz (44.3 kg)    Telemetry    A paced - Personally Reviewed  ECG    A paced - Personally Reviewed  Physical Exam   General: Well developed, well nourished, male appearing in no acute distress. Head: Normocephalic, atraumatic.  Neck: Supple without bruits, JVD. Lungs:  Resp regular and unlabored, CTA. Heart: RRR, S1, S2, no S3, S4, or murmur; no rub. Abdomen: Soft, non-tender, non-distended with normoactive bowel sounds. No hepatomegaly. No rebound/guarding. No obvious abdominal masses. Extremities: No clubbing, cyanosis, edema. Distal pedal pulses decreased on the left. Redness noted to left toes. Neuro: Alert and oriented X 3. Moves all extremities spontaneously. Psych: Normal affect.  Labs    Chemistry Recent Labs Lab 05/30/16 1122 06/04/16 0217  NA 142 139  K 5.5* 3.6  CL 104 103  CO2 28 30  GLUCOSE 128* 138*  BUN 17 17  CREATININE 1.60* 1.56*  CALCIUM 9.5 8.3*  GFRNONAA 40* 40*  GFRAA 46* 47*  ANIONGAP  --   6     Hematology Recent Labs Lab 05/30/16 1122  WBC 8.2  RBC 4.91  HGB 14.8  HCT 44.8  MCV 91.2  MCH 30.1  MCHC 33.0  RDW 13.5  PLT 276    Cardiac EnzymesNo results for input(s): TROPONINI in the last 168 hours. No results for input(s): TROPIPOC in the last 168 hours.   BNPNo results for input(s): BNP, PROBNP in the last 168 hours.   DDimer No results for input(s): DDIMER in the last 168 hours.    Radiology    No results found.  Cardiac Studies   N/A  Patient Profile     82 y.o. male with PMH of PAD, HTN, HL, carotid artery stent who presented for prehydration IVFs for PV procedure with Dr. Gwenlyn Found.   Assessment & Plan    1. PVD: Admitted for prehydration for mildly elevated Cr. 1.6>>1.56 this morning. Planned for PV intervention with Dr. Gwenlyn Found this afternoon for symptomatic claudication.   2. HTN: Stable  3. HL: statin intolerant  4. PAF: Xarelto held for PV case today.  Signed, Reino Bellis, NP  06/04/2016, 10:22 AM

## 2016-06-04 NOTE — Interval H&P Note (Signed)
History and Physical Interval Note:  06/04/2016 11:18 AM  Thomas Snow  has presented today for surgery, with the diagnosis of pad  The various methods of treatment have been discussed with the patient and family. After consideration of risks, benefits and other options for treatment, the patient has consented to  Procedure(s): Lower Extremity Intervention (N/A) as a surgical intervention .  The patient's history has been reviewed, patient examined, no change in status, stable for surgery.  I have reviewed the patient's chart and labs.  Questions were answered to the patient's satisfaction.     Quay Burow

## 2016-06-04 NOTE — Progress Notes (Signed)
Site area: right groin  Site Prior to Removal:  Level 0  Pressure Applied For 20 MINUTES    Minutes Beginning at 1440  Manual:   Yes.    Patient Status During Pull:  stable  Post Pull Groin Site:  Level 0  Post Pull Instructions Given:  Yes.    Post Pull Pulses Present:  Yes.    Dressing Applied:  Yes.    Comments:  Rechecked at 1515 and throughout shift, bleeding noted at 1700 to 1800 with no change, dressing changed and rechecked with no bleeding noted at 1900

## 2016-06-04 NOTE — Discharge Summary (Addendum)
The patient has been seen in conjunction with Harlan Stains, NP. All aspects of care have been considered and discussed. The patient has been personally interviewed, examined, and all clinical data has been reviewed.   Uncomplicated PV procedure by Dr. Gwenlyn Found yesterday.  Laboratory data stable including hemoglobin and creatinine.  Notably, triple drug therapy with aspirin, Plavix, ans Xarelto for 1 month.  Discharge with follow-up as outlined.   Discharge Summary    Patient ID: Thomas Snow,  MRN: [id number], DOB/AGE: 1934/01/07 82 y.o.  Admit date: 06/03/2016 Discharge date: 06/05/2016  Primary Care Provider: Cyndi Bender Primary Cardiologist: Gwenlyn Found   Discharge Diagnoses    Active Problems:   PVD (peripheral vascular disease) [facility])   Critical lower limb ischemia   CKD (chronic kidney disease) stage 4, GFR 15-29 ml/min (HCC)   Allergies Allergies  Allergen Reactions  . Atorvastatin     Leg pain  . Crestor [Rosuvastatin]     Leg pain    Diagnostic Studies/Procedures    PV angiogram: 06/04/16  Procedures Performed:            1. Abdominal aortogram/bilateral iliac angiogram/left lower extremity runoff            2. Contralateral access (second order catheter placement)            3. Placement of a spider distal protection device in the below-the-knee popliteal artery            4. Hawk 1 directional atherectomy, drug-eluting balloon angioplasty of a long chronic total occlusion left SFA            5. Viabahn  covered stent being entire left SFA  Final Impression: Successful Hawk 1 directional atherectomy followed by drug-eluting balloon angioplasty of the long left SFA CTO using spider distal protection with obvious thrombotic component requiring Viabahn  covered stenting. The patient will only need lifelong dual antibiotic therapy. He will be hydrated overnight. The sheath will be removed once the ACT falls below 170 and pressure held. We will get  follow-up lower extremity arterial Doppler studies in our [facility] line office next week and I will see him back in 2-3 weeks thereafter. He left the lab in stable condition.  Quay Burow. MD, [facility] _____________   History of Present Illness     82 yo male with PMH of PAD, HTN, HL, carotid artery stent who is followed by Dr. Gwenlyn Found in the outpatient related to decreased ABIs, and redness/pain in the left foot. Dopplers done on 05/08/16 showed left ABI 0.3 with occluded left SFA. He was referred for PV angiogram. He was admitted for pre hydration and Xarelto stopped 3 days prior to admission.   Hospital Course     He underwent successful Hawk 1 directional atherectomy with drug eluting balloon angioplasty to the left SFA. Viabahn stenting done to the entire left SFA with plans for lifelong DAPT with ASA/plavix. Discussed with Dr. Gwenlyn Found and will continue with triple therapy with ASA, Plavix and Xarelto for one month, then stop ASA. Post cath labs showed Cr 1. 62 and Hgb 13.1. Reported no complaints post cath.   He was seen by Dr. Tamala Julian and determined stable for discharge home. Follow up in the office, along with dopplers have been arranged. Medications are listed below. He is eligible for discharge today once ambulating. Access site is unremarkable. No evidence of right femoral hematoma.  _____________  Discharge Vitals Blood pressure (!) 136/50, pulse 98, temperature  97.7 F (36.5 C), temperature source Oral, resp. rate 18, height 5\' 6"  (1.676 m), weight 97 lb (44 kg), SpO2 97 %.  Filed Weights   06/03/16 1753 06/05/16 0211  Weight: 97 lb 10.6 oz (44.3 kg) 97 lb (44 kg)    Labs & Radiologic Studies    CBC  Recent Labs  06/05/16 0320  WBC 14.0*  HGB 13.1  HCT 39.4  MCV 87.6  PLT 809   Basic Metabolic Panel  Recent Labs  06/04/16 0217 06/05/16 0320  NA 139 140  K 3.6 3.6  CL 103 106  CO2 30 23  GLUCOSE 138* 158*  BUN 17 14  CREATININE 1.56* 1.62*  CALCIUM 8.3* 8.5*    Liver Function Tests No results for input(s): AST, ALT, ALKPHOS, BILITOT, PROT, ALBUMIN in the last 72 hours. No results for input(s): LIPASE, AMYLASE in the last 72 hours. Cardiac Enzymes No results for input(s): CKTOTAL, CKMB, CKMBINDEX, TROPONINI in the last 72 hours. BNP Invalid input(s): POCBNP D-Dimer No results for input(s): DDIMER in the last 72 hours. Hemoglobin A1C No results for input(s): HGBA1C in the last 72 hours. Fasting Lipid Panel No results for input(s): CHOL, HDL, LDLCALC, TRIG, CHOLHDL, LDLDIRECT in the last 72 hours. Thyroid Function Tests No results for input(s): TSH, T4TOTAL, T3FREE, THYROIDAB in the last 72 hours.  Invalid input(s): FREET3 _____________  Dg Chest 2 View  Result Date: 05/30/2016 CLINICAL DATA:  Preoperative evaluation for upcoming vascular procedure EXAM: CHEST  2 VIEW COMPARISON:  03/19/2014 FINDINGS: Cardiac shadow is within normal limits. Pacing device is again seen and stable. The lungs are well aerated bilaterally. No focal infiltrate or sizable effusion is seen. Degenerative changes of the thoracic spine are noted. IMPRESSION: No active cardiopulmonary disease. Electronically Signed   By: Inez Catalina M.D.   On: 05/30/2016 11:00   Disposition   Pt is being discharged home today in good condition.  Follow-up Plans & Appointments    Follow-up Information    Lorretta Harp, MD Follow up on 06/22/2016.   Specialties:  Cardiology, Radiology Why:  at [place] for your follow up appt.  Contact information: [address] [place] [address] [phone]        [facility] [address] Follow up on 06/21/2016.   Specialty:  Cardiology Why:  at 3:30pm for your follow up dopplers.  Contact information: [place] [id number] [place] [phone]         Discharge Instructions    Call MD for:  redness, tenderness, or signs of infection (pain,  swelling, redness, odor or green/yellow discharge around incision site)    Complete by:  As directed    Diet - low sodium heart healthy    Complete by:  As directed    Discharge instructions    Complete by:  As directed    Groin Site Care Refer to this sheet in the next few weeks. These instructions provide you with information on caring for yourself after your procedure. Your caregiver may also give you more specific instructions. Your treatment has been planned according to current medical practices, but problems sometimes occur. Call your caregiver if you have any problems or questions after your procedure. HOME CARE INSTRUCTIONS You may shower 24 hours after the procedure. Remove the bandage (dressing) and gently wash the site with plain soap and water. Gently pat the site dry.  Do not apply powder or lotion to the site.  Do  not sit in a bathtub, swimming pool, or whirlpool for 5 to 7 days.  No bending, squatting, or lifting anything over 10 pounds (4.5 kg) as directed by your caregiver.  Inspect the site at least twice daily.  Do not drive home if you are discharged the same day of the procedure. Have someone else drive you.  You may drive 24 hours after the procedure unless otherwise instructed by your caregiver.  What to expect: Any bruising will usually fade within 1 to 2 weeks.  Blood that collects in the tissue (hematoma) may be painful to the touch. It should usually decrease in size and tenderness within 1 to 2 weeks.  SEEK IMMEDIATE MEDICAL CARE IF: You have unusual pain at the groin site or down the affected leg.  You have redness, warmth, swelling, or pain at the groin site.  You have drainage (other than a small amount of blood on the dressing).  You have chills.  You have a fever or persistent symptoms for more than 72 hours.  You have a fever and your symptoms suddenly get worse.  Your leg becomes pale, cool, tingly, or numb.  You have heavy bleeding from the site. Hold  pressure on the site. .   You will need to be on aspirin, plavix and xarelto for one month, then stop taking aspirin. Continue to take plavix and xarelto. Please watch for any signs/symptoms of bleeding including blood in your stool/urine and contact the office.   Increase activity slowly    Complete by:  As directed       Discharge Medications   Current Discharge Medication List    START taking these medications   Details  aspirin 81 MG EC tablet Take 1 tablet (81 mg total) by mouth daily. Qty: 30 tablet    clopidogrel (PLAVIX) 75 MG tablet Take 1 tablet (75 mg total) by mouth daily with breakfast. Qty: 90 tablet, Refills: 1      CONTINUE these medications which have NOT CHANGED   Details  acetaminophen (TYLENOL) 325 MG tablet Take 650 mg by mouth 3 (three) times daily.    donepezil (ARICEPT) 10 MG tablet Take 10 mg by mouth every morning.    fish oil-omega-3 fatty acids 1000 MG capsule Take 1 g by mouth daily.    XARELTO 20 MG TABS tablet Take 1 tablet by mouth at bedtime.      STOP taking these medications     cilostazol (PLETAL) 50 MG tablet           Outstanding Labs/Studies   Follow up dopplers  Duration of Discharge Encounter   Greater than 30 minutes including physician time.  Signed, Reino Bellis NP-C 06/05/2016, 10:46 AM

## 2016-06-05 ENCOUNTER — Other Ambulatory Visit: Payer: Self-pay | Admitting: Cardiology

## 2016-06-05 DIAGNOSIS — N184 Chronic kidney disease, stage 4 (severe): Secondary | ICD-10-CM | POA: Diagnosis not present

## 2016-06-05 DIAGNOSIS — I998 Other disorder of circulatory system: Secondary | ICD-10-CM

## 2016-06-05 DIAGNOSIS — I739 Peripheral vascular disease, unspecified: Secondary | ICD-10-CM | POA: Diagnosis not present

## 2016-06-05 DIAGNOSIS — I70222 Atherosclerosis of native arteries of extremities with rest pain, left leg: Secondary | ICD-10-CM | POA: Diagnosis not present

## 2016-06-05 LAB — CBC
HEMATOCRIT: 39.4 % (ref 39.0–52.0)
Hemoglobin: 13.1 g/dL (ref 13.0–17.0)
MCH: 29.1 pg (ref 26.0–34.0)
MCHC: 33.2 g/dL (ref 30.0–36.0)
MCV: 87.6 fL (ref 78.0–100.0)
PLATELETS: 238 10*3/uL (ref 150–400)
RBC: 4.5 MIL/uL (ref 4.22–5.81)
RDW: 12.5 % (ref 11.5–15.5)
WBC: 14 10*3/uL — AB (ref 4.0–10.5)

## 2016-06-05 LAB — BASIC METABOLIC PANEL
Anion gap: 11 (ref 5–15)
BUN: 14 mg/dL (ref 6–20)
CHLORIDE: 106 mmol/L (ref 101–111)
CO2: 23 mmol/L (ref 22–32)
Calcium: 8.5 mg/dL — ABNORMAL LOW (ref 8.9–10.3)
Creatinine, Ser: 1.62 mg/dL — ABNORMAL HIGH (ref 0.61–1.24)
GFR calc non Af Amer: 38 mL/min — ABNORMAL LOW (ref >60)
GFR, EST AFRICAN AMERICAN: 45 mL/min — AB (ref >60)
Glucose, Bld: 158 mg/dL — ABNORMAL HIGH (ref 65–99)
POTASSIUM: 3.6 mmol/L (ref 3.5–5.1)
SODIUM: 140 mmol/L (ref 135–145)

## 2016-06-05 MED ORDER — CLOPIDOGREL BISULFATE 75 MG PO TABS: 75.0000 mg | ORAL_TABLET | Freq: Every day | ORAL | 1 refills | Status: DC

## 2016-06-05 MED ORDER — ASPIRIN 81 MG PO TBEC: 81.0000 mg | DELAYED_RELEASE_TABLET | Freq: Every day | ORAL | Status: DC

## 2016-06-05 NOTE — Progress Notes (Signed)
Patient continues to remove cardiac monitor. Wife states it is agitating him. Have placed cardiac monitor on several times and patient is in and out of confused state. After discussing situation and history with Fransico Him, MD cardiology we have concluded patient may remain off the cardiac monitor at this time due to his consistency pulling it off. Patients wife states at home he stays up and snacks all night.

## 2016-06-05 NOTE — Progress Notes (Signed)
Patient awake, alert, confused in conversation at times. Restless. At this time the plan is to reduce medication use such as morphine and other sedatives as per wife "last time he was sedated/put to sleep, he acted this same way." reassured patient and family that usually in the hospital setting patients with known hx of dementia tend to be increasingly confused compared to home. However staff will continue to use other measures to ensure patient safety. Patient is now in bed, eyes closed, bed alarm on, placed close to nurse station with wife at bedside.

## 2016-06-05 NOTE — Progress Notes (Signed)
Patient disoriented to place, time and situation. Pulled out 3rd IV. pts wife states "he is not like this at home."  Pt yelled at nurse tech. RN went in room and pt calmed down rather quickly, no longer yelling. Is cooperative with RN. Repositioned in bed and bed alarm set. Will leave IV out per verbal order from Fransico Him, MD since pt only has 2 more hours of fluids.

## 2016-06-05 NOTE — Progress Notes (Signed)
D/c instructions reviewed with pt/wife. Copy of instructions given to wife. Pt anxious and irritable, ready to go home not wanting to listen to instructions, wanted his wife to listen.  Pt d/c'd via wheelchair with belongings with wife, escorted by unit NT.

## 2016-06-06 ENCOUNTER — Ambulatory Visit (HOSPITAL_COMMUNITY): Payer: PPO

## 2016-06-06 NOTE — Telephone Encounter (Signed)
Patient had PV angiogram on 6/4 (final impression from procedure report copied below). Patient c/o med pain - deferred to MD  Final Impression: Successful Hawk 1 directional atherectomy followed by drug-eluting balloon angioplasty of the long left SFA CTO using spider distal protection with obvious thrombotic component requiring Viabahn  covered stenting.

## 2016-06-06 NOTE — Telephone Encounter (Signed)
Pt wife verbalized that he is still in pain when he get up to walk around   Foot back of his leg and she want to know what he can take

## 2016-06-07 ENCOUNTER — Other Ambulatory Visit: Payer: Self-pay | Admitting: Cardiology

## 2016-06-07 DIAGNOSIS — I739 Peripheral vascular disease, unspecified: Secondary | ICD-10-CM

## 2016-06-07 NOTE — Telephone Encounter (Signed)
Spoke to the wife, per the DPR. The patient had a PV angiogram on 6/4.  Pre procedure, the patient's left foot was cold, red and swollen. The patient's left foot is now red, warm and swollen. She did state that the swelling was much better today. The wife stated that the pulse is palpable.  The patient has pain from his hip down when ambulating. At rest, he stated that it does not hurt. He is currently taking Tylenol bid which does help alleviate the pain.  She stated the groin incision is not red or oozing and looks "good", no sign of hematoma per wife. Patient denies any shortness of breath or fevers.   The wife's concern is what is "normal" post procedure symptom wise. The biggest concern is the pain which radiates from the hip down when ambulating. Will route to the physician for further recommendation. The wife was informed that if he got a fever, there was no longer a pulse, redness or oozing developed at the site or if she felt more concerned then she should head to the hospital for further evaluation. She verbalized her understanding.

## 2016-06-07 NOTE — Telephone Encounter (Signed)
New Message    Pt is having a lot of pain from having stent put in , his foot is swollen and red, what can they do for the patient

## 2016-06-12 NOTE — Telephone Encounter (Signed)
I'm encouraged that foot feels better. I would have him come in to see mid-level provider this week to evaluate his foot and I will see him back at a regularly scheduled time.

## 2016-06-13 ENCOUNTER — Ambulatory Visit (INDEPENDENT_AMBULATORY_CARE_PROVIDER_SITE_OTHER): Payer: PPO | Admitting: Pharmacist Clinician (PhC)/ Clinical Pharmacy Specialist

## 2016-06-13 ENCOUNTER — Encounter: Payer: Self-pay | Admitting: Pharmacist Clinician (PhC)/ Clinical Pharmacy Specialist

## 2016-06-13 DIAGNOSIS — I480 Paroxysmal atrial fibrillation: Secondary | ICD-10-CM

## 2016-06-13 LAB — POCT ACTIVATED CLOTTING TIME: ACTIVATED CLOTTING TIME: 175 s

## 2016-06-13 MED ORDER — RIVAROXABAN 15 MG PO TABS: 15.0000 mg | ORAL_TABLET | Freq: Every day | ORAL | 5 refills | Status: DC

## 2016-06-13 NOTE — Telephone Encounter (Signed)
Spoke with the patient today after he was seen by pharmacy at the practice. He stated that he was feeling much better and the pain has decreased. He declined an appointment with a PA and stated that he would like to wait for his appointment with Dr. Gwenlyn Found on 6/22. He did ask if his LEA and carotid appointment could be moved to the 22nd because they have to drive a ways to get here. Vascular scheduling has been consulted.

## 2016-06-13 NOTE — Patient Instructions (Signed)
Switch to 15 mg tablets of Xarelto once your 20 mg tablets are gone.    New prescription sent to [facility] in [place]

## 2016-06-13 NOTE — Progress Notes (Signed)
Patient with PAF on Xarelto, now 2 weeks post stenting for PAD.  Was put on clopidogrel 75 mg and aspirin 81 mg.    Most recent BMET (June 5, at hospital), showed SCr of 1.62, GFR of 38.  CrCl calculated with age of 57 and weight of 45.7 kg.  Based on Cockcroft-Gault is 23.5.    With this, his dose should be 15 mg daily.  Currently he is on 20 mg daily, has about 5 tablets remaining.  Will have him switch to 15 mg tablets, continue with 1 tablet daily at meal time.  Gave samples as well as free 30 day card.

## 2016-06-21 ENCOUNTER — Ambulatory Visit (HOSPITAL_COMMUNITY)
Admission: RE | Admit: 2016-06-21 | Discharge: 2016-06-21 | Disposition: A | Discharge status: Home / Self Care | Payer: PPO | Source: Ambulatory Visit | Attending: Cardiology | Admitting: Cardiology

## 2016-06-21 DIAGNOSIS — I10 Essential (primary) hypertension: Secondary | ICD-10-CM | POA: Diagnosis not present

## 2016-06-21 DIAGNOSIS — I739 Peripheral vascular disease, unspecified: Secondary | ICD-10-CM | POA: Diagnosis not present

## 2016-06-21 DIAGNOSIS — Z95820 Peripheral vascular angioplasty status with implants and grafts: Secondary | ICD-10-CM | POA: Insufficient documentation

## 2016-06-21 DIAGNOSIS — I6523 Occlusion and stenosis of bilateral carotid arteries: Secondary | ICD-10-CM | POA: Diagnosis not present

## 2016-06-21 DIAGNOSIS — I2510 Atherosclerotic heart disease of native coronary artery without angina pectoris: Secondary | ICD-10-CM | POA: Insufficient documentation

## 2016-06-21 DIAGNOSIS — E785 Hyperlipidemia, unspecified: Secondary | ICD-10-CM | POA: Diagnosis not present

## 2016-06-21 DIAGNOSIS — Z87891 Personal history of nicotine dependence: Secondary | ICD-10-CM | POA: Insufficient documentation

## 2016-06-22 ENCOUNTER — Ambulatory Visit: Payer: PPO | Admitting: Cardiovascular Disease

## 2016-06-27 ENCOUNTER — Ambulatory Visit (INDEPENDENT_AMBULATORY_CARE_PROVIDER_SITE_OTHER): Payer: PPO | Admitting: Cardiovascular Disease

## 2016-06-27 ENCOUNTER — Encounter: Payer: Self-pay | Admitting: Cardiovascular Disease

## 2016-06-27 VITALS — BP 158/52 | HR 78 | Ht 66.0 in | Wt 99.2 lb

## 2016-06-27 DIAGNOSIS — I739 Peripheral vascular disease, unspecified: Secondary | ICD-10-CM

## 2016-06-27 DIAGNOSIS — I998 Other disorder of circulatory system: Secondary | ICD-10-CM | POA: Diagnosis not present

## 2016-06-27 DIAGNOSIS — I6523 Occlusion and stenosis of bilateral carotid arteries: Secondary | ICD-10-CM | POA: Diagnosis not present

## 2016-06-27 DIAGNOSIS — I70229 Atherosclerosis of native arteries of extremities with rest pain, unspecified extremity: Secondary | ICD-10-CM

## 2016-06-27 NOTE — Assessment & Plan Note (Signed)
History of critical limb ischemia with recent percutaneous intervention performed by myself 06/03/16 of a total left SFA beginning at the origin and reconstituting in the adductor canal three-vessel runoff. I performed heart quadrant atherectomy distal protection and ultimately performed Viabahn covered stenting. His ABI initially was 0.3 and rose to 0.94. His SFA is widely patent. He has no redness in his left foot and there is no longer any discomfort. He has palpable pedal pulses. He does have atrial fibrillation on Xarelto as well as low-dose aspirin and Plavix. We will discontinue his Low-dose aspirin after one month and continue his Xarelto and  Plavix. We will follow-up his lower extremity until Doppler studies in 6 months.

## 2016-06-27 NOTE — Assessment & Plan Note (Signed)
History of carotid artery disease with recent carotid Doppler performed 06/21/16 revealing mild right and moderate left ICA stenosis. He has had a right carotid endarterectomy in the past. We will repeat one year.

## 2016-06-27 NOTE — Patient Instructions (Signed)
Medication Instructions: Your physician recommends that you continue on your current medications as directed. Please refer to the Current Medication list given to you today.  STOP Aspirin on July 4. Continue Xarelto (Rivaroxaban) and Plavix (Clopidogrel).   Testing/Procedures: Your physician has requested that you have a carotid duplex in 1 year. This test is an ultrasound of the carotid arteries in your neck. It looks at blood flow through these arteries that supply the brain with blood. Allow one hour for this exam. There are no restrictions or special instructions.  In 6 months: Your physician has requested that you have a lower extremity arterial duplex. During this test, ultrasound is used to evaluate arterial blood flow in the legs. Allow one hour for this exam. There are no restrictions or special instructions.  Your physician has requested that you have an ankle brachial index (ABI). During this test an ultrasound and blood pressure cuff are used to evaluate the arteries that supply the arms and legs with blood. Allow thirty minutes for this exam. There are no restrictions or special instructions.  Follow-Up: Your physician wants you to follow-up in: 6 months with Dr. Gwenlyn Found. You will receive a reminder letter in the mail two months in advance. If you don't receive a letter, please call our office to schedule the follow-up appointment.  If you need a refill on your cardiac medications before your next appointment, please call your pharmacy.

## 2016-06-27 NOTE — Progress Notes (Signed)
06/27/2016 Thomas Snow   1934/11/14  [id number]  Primary Physician Cyndi Bender, PA-C Primary Cardiologist: Lorretta Harp MD Renae Gloss  HPI:  Thomas Snow is an 82 year old thin and frail appearing Caucasian male accompanied by his wife Thomas Snow.  He is seen by Dr. Sallyanne Kuster. I last saw him in the office 05/16/16.Marland Kitchen He has a history of peripheral arterial disease and coronary artery disease. He has a history of coronary intervention back in 2001, remote left subclavian stenting, pacemaker implantation with paroxysmal atrial fibrillation maintaining sinus rhythm on the Route oral anticoagulation. He does have a serum creatinine in the mid 1 range. ABIs performed regularly revealed a left ABI 0.35. He complains of chronic redness and pain in his left foot as well as left calf claudication. Dopplers performed 05/08/16 revealed a left ABI 0.3 with an occluded left SFA. Because of redness in his right foot and ongoing resting pain he was admitted for hydration on 06/02/16 and underwent angiography and intervention by myself the following day (06/03/16). I Ultimately put 2 Viabahn covered stents in his left SFA CTO. He had three-vessel runoff.His symptoms have completely resolved. ABI increased from 0.3 up to 0.94.   Current Outpatient Prescriptions  Medication Sig Dispense Refill  . acetaminophen (TYLENOL) 325 MG tablet Take 650 mg by mouth 3 (three) times daily.    . clopidogrel (PLAVIX) 75 MG tablet Take 1 tablet (75 mg total) by mouth daily with breakfast. 90 tablet 1  . donepezil (ARICEPT) 10 MG tablet Take 10 mg by mouth every morning.    . fish oil-omega-3 fatty acids 1000 MG capsule Take 1 g by mouth daily.    . Rivaroxaban (XARELTO) 15 MG TABS tablet Take 1 tablet (15 mg total) by mouth daily with supper. 30 tablet 5   No current facility-administered medications for this visit.     Allergies  Allergen Reactions  . Atorvastatin     Leg pain  . Crestor [Rosuvastatin]       Leg pain    Social History   Social History  . Marital status: Married    Spouse name: N/A  . Number of children: N/A  . Years of education: N/A   Occupational History  . Not on file.   Social History Main Topics  . Smoking status: Former Smoker    Packs/day: 0.50    Years: 25.00    Types: Cigarettes    Quit date: 08/19/1996  . Smokeless tobacco: Current User    Types: Snuff  . Alcohol use No  . Drug use: No  . Sexual activity: Yes   Other Topics Concern  . Not on file   Social History Narrative  . No narrative on file     Review of Systems: General: negative for chills, fever, night sweats or weight changes.  Cardiovascular: negative for chest pain, dyspnea on exertion, edema, orthopnea, palpitations, paroxysmal nocturnal dyspnea or shortness of breath Dermatological: negative for rash Respiratory: negative for cough or wheezing Urologic: negative for hematuria Abdominal: negative for nausea, vomiting, diarrhea, bright red blood per rectum, melena, or hematemesis Neurologic: negative for visual changes, syncope, or dizziness All other systems reviewed and are otherwise negative except as noted above.    Blood pressure (!) 158/52, pulse 78, height 5\' 6"  (1.676 m), weight 99 lb 3.2 oz (45 kg).  General appearance: alert and no distress Neck: no adenopathy, no JVD, supple, symmetrical, trachea midline, thyroid not enlarged, symmetric, no tenderness/mass/nodules and loud bilateral  carotid bruits Lungs: clear to auscultation bilaterally Heart: irregularly irregular rhythm Extremities: extremities normal, atraumatic, no cyanosis or edema  EKG Sinus rhythm at 78 without ST or T-wave changes. I personally reviewed this EKG.  ASSESSMENT AND PLAN:   Carotid stenosis, bilateral s/p R CEA 2013 History of carotid artery disease with recent carotid Doppler performed 06/21/16 revealing mild right and moderate left ICA stenosis. He has had a right carotid endarterectomy in  the past. We will repeat one year.  Critical lower limb ischemia History of critical limb ischemia with recent percutaneous intervention performed by myself 06/03/16 of a total left SFA beginning at the origin and reconstituting in the adductor canal three-vessel runoff. I performed heart quadrant atherectomy distal protection and ultimately performed Viabahn covered stenting. His ABI initially was 0.3 and rose to 0.94. His SFA is widely patent. He has no redness in his left foot and there is no longer any discomfort. He has palpable pedal pulses. He does have atrial fibrillation on Xarelto as well as low-dose aspirin and Plavix. We will discontinue his Low-dose aspirin after one month and continue his Xarelto and  Plavix. We will follow-up his lower extremity until Doppler studies in 6 months.      Lorretta Harp MD FACP,FACC,FAHA, [facility] 06/27/2016 2:16 PM

## 2016-07-05 ENCOUNTER — Other Ambulatory Visit: Payer: Self-pay | Admitting: Cardiovascular Disease

## 2016-07-05 DIAGNOSIS — I6521 Occlusion and stenosis of right carotid artery: Secondary | ICD-10-CM

## 2016-08-27 DIAGNOSIS — H26493 Other secondary cataract, bilateral: Secondary | ICD-10-CM | POA: Diagnosis not present

## 2016-09-24 DIAGNOSIS — E7800 Pure hypercholesterolemia, unspecified: Secondary | ICD-10-CM | POA: Diagnosis not present

## 2016-09-24 DIAGNOSIS — I2510 Atherosclerotic heart disease of native coronary artery without angina pectoris: Secondary | ICD-10-CM | POA: Diagnosis not present

## 2016-09-24 DIAGNOSIS — I739 Peripheral vascular disease, unspecified: Secondary | ICD-10-CM | POA: Diagnosis not present

## 2016-09-24 DIAGNOSIS — Z79899 Other long term (current) drug therapy: Secondary | ICD-10-CM | POA: Diagnosis not present

## 2016-09-24 DIAGNOSIS — F0390 Unspecified dementia without behavioral disturbance: Secondary | ICD-10-CM | POA: Diagnosis not present

## 2016-09-24 DIAGNOSIS — G4700 Insomnia, unspecified: Secondary | ICD-10-CM | POA: Diagnosis not present

## 2016-09-24 DIAGNOSIS — I480 Paroxysmal atrial fibrillation: Secondary | ICD-10-CM | POA: Diagnosis not present

## 2016-09-24 DIAGNOSIS — Z789 Other specified health status: Secondary | ICD-10-CM | POA: Diagnosis not present

## 2016-09-24 DIAGNOSIS — E119 Type 2 diabetes mellitus without complications: Secondary | ICD-10-CM | POA: Diagnosis not present

## 2016-10-04 DIAGNOSIS — Z23 Encounter for immunization: Secondary | ICD-10-CM | POA: Diagnosis not present

## 2016-10-29 DIAGNOSIS — Z0000 Encounter for general adult medical examination without abnormal findings: Secondary | ICD-10-CM | POA: Diagnosis not present

## 2016-10-29 DIAGNOSIS — E785 Hyperlipidemia, unspecified: Secondary | ICD-10-CM | POA: Diagnosis not present

## 2016-10-29 DIAGNOSIS — Z9181 History of falling: Secondary | ICD-10-CM | POA: Diagnosis not present

## 2016-10-29 DIAGNOSIS — Z1331 Encounter for screening for depression: Secondary | ICD-10-CM | POA: Diagnosis not present

## 2016-10-29 DIAGNOSIS — Z136 Encounter for screening for cardiovascular disorders: Secondary | ICD-10-CM | POA: Diagnosis not present

## 2016-12-19 ENCOUNTER — Telehealth: Payer: Self-pay | Admitting: Cardiovascular Disease

## 2016-12-19 MED ORDER — CLOPIDOGREL BISULFATE 75 MG PO TABS: 75.0000 mg | ORAL_TABLET | Freq: Every day | ORAL | 6 refills | Status: DC

## 2016-12-19 NOTE — Telephone Encounter (Signed)
New Message       Pt c/o medication issue:  1. Name of Medication:  plavix and xarelto   2. How are you currently taking this medication (dosage and times per day)? As prescribed   3. Are you having a reaction (difficulty breathing--STAT)?  No   4. What is your medication issue? Patient wife wants to know if he needs to continue taking both blood thinners , they are expensive and he is taking 2 of them

## 2016-12-19 NOTE — Telephone Encounter (Signed)
Returned call to patient's wife.She stated husband needs a refill on plavix.Stated he is taking both xarelto and plavix,both are expensive.She wanted to know if necessary to take 2 blood thinners.Advised to continue all medications. She is concerned, patient needs appointments with Dr.Berry and Dr.Croitoru both are past due and she never received a reminder letter.Advised scheduler will call back with lower ext doppler appointment and appointments with Dr.Berry and Dr.Croitiru.

## 2017-01-04 ENCOUNTER — Ambulatory Visit (HOSPITAL_COMMUNITY)
Admission: RE | Admit: 2017-01-04 | Discharge: 2017-01-04 | Disposition: A | Discharge status: Home / Self Care | Payer: PPO | Source: Ambulatory Visit | Attending: Cardiovascular Disease | Admitting: Cardiovascular Disease

## 2017-01-04 DIAGNOSIS — I739 Peripheral vascular disease, unspecified: Secondary | ICD-10-CM | POA: Diagnosis not present

## 2017-01-09 ENCOUNTER — Encounter: Payer: Self-pay | Admitting: Cardiovascular Disease

## 2017-01-09 ENCOUNTER — Ambulatory Visit: Payer: PPO | Admitting: Cardiovascular Disease

## 2017-01-09 VITALS — BP 116/58 | HR 92 | Ht 66.0 in | Wt 102.2 lb

## 2017-01-09 DIAGNOSIS — I6523 Occlusion and stenosis of bilateral carotid arteries: Secondary | ICD-10-CM

## 2017-01-09 DIAGNOSIS — I739 Peripheral vascular disease, unspecified: Secondary | ICD-10-CM | POA: Diagnosis not present

## 2017-01-09 DIAGNOSIS — I10 Essential (primary) hypertension: Secondary | ICD-10-CM | POA: Diagnosis not present

## 2017-01-09 NOTE — Progress Notes (Signed)
01/09/2017 TANNER YELEY   1934-06-16  [id number]  Primary Physician Cyndi Bender, PA-C Primary Cardiologist: Lorretta Harp MD Garret Reddish, [place], [place]  HPI:  Thomas Snow is a 83 y.o.  thin and frail appearing Caucasian male accompanied by his wife [name].  He is a patient of Dr. Sallyanne Kuster. I last saw him in the office 06/27/16.Marland Kitchen He has a history of peripheral arterial disease and coronary artery disease. He has a history of coronary intervention back in 2001, remote left subclavian stenting, pacemaker implantation with paroxysmal atrial fibrillation maintaining sinus rhythm on the Route oral anticoagulation. He does have a serum creatinine in the mid 1 range. ABIs performed regularly revealed a left ABI 0.35. He complains of chronic redness and pain in his left foot as well as left calf claudication. Dopplers performed 05/08/16 revealed a left ABI 0.3 with an occluded left SFA. Because of redness in his right foot and ongoing resting pain he was admitted for hydration on 06/02/16 and underwent angiography and intervention by myself the following day (06/03/16). I Ultimately put 2 Viabahn covered stents in his left SFA CTO. He had three-vessel runoff.His symptoms have completely resolved. ABI increased from 0.3 up to 0.94. Since I saw him in the office 6 months ago he's remained stable from a peripheral last report of view. He denies claudication. His recent Dopplers performed 01/05/17 were completely normal. The patent left SFA stent and normal ABIs.     Current Meds  Medication Sig  . acetaminophen (TYLENOL) 325 MG tablet Take 650 mg by mouth 3 (three) times daily.  . clopidogrel (PLAVIX) 75 MG tablet Take 1 tablet (75 mg total) by mouth daily with breakfast.  . donepezil (ARICEPT) 10 MG tablet Take 10 mg by mouth every morning.  . mirtazapine (REMERON) 15 MG tablet Take 0.5 tablets by mouth at bedtime.  . Rivaroxaban (XARELTO) 15 MG TABS tablet Take 1 tablet (15 mg total) by mouth  daily with supper.     Allergies  Allergen Reactions  . Atorvastatin     Leg pain  . Crestor [Rosuvastatin]     Leg pain    Social History   Socioeconomic History  . Marital status: Married    Spouse name: Not on file  . Number of children: Not on file  . Years of education: Not on file  . Highest education level: Not on file  Social Needs  . Financial resource strain: Not on file  . Food insecurity - worry: Not on file  . Food insecurity - inability: Not on file  . Transportation needs - medical: Not on file  . Transportation needs - non-medical: Not on file  Occupational History  . Not on file  Tobacco Use  . Smoking status: Former Smoker    Packs/day: 0.50    Years: 25.00    Pack years: 12.50    Types: Cigarettes    Last attempt to quit: 08/19/1996    Years since quitting: 20.4  . Smokeless tobacco: Current User    Types: Snuff  Substance and Sexual Activity  . Alcohol use: No  . Drug use: No  . Sexual activity: Yes  Other Topics Concern  . Not on file  Social History Narrative  . Not on file     Review of Systems: General: negative for chills, fever, night sweats or weight changes.  Cardiovascular: negative for chest pain, dyspnea on exertion, edema, orthopnea, palpitations, paroxysmal nocturnal dyspnea or shortness of breath Dermatological:  negative for rash Respiratory: negative for cough or wheezing Urologic: negative for hematuria Abdominal: negative for nausea, vomiting, diarrhea, bright red blood per rectum, melena, or hematemesis Neurologic: negative for visual changes, syncope, or dizziness All other systems reviewed and are otherwise negative except as noted above.    Blood pressure (!) 116/58, pulse 92, height 5\' 6"  (1.676 m), weight 102 lb 3.2 oz (46.4 kg).  General appearance: alert and no distress Neck: no adenopathy, no JVD, supple, symmetrical, trachea midline, thyroid not enlarged, symmetric, no tenderness/mass/nodules and Soft bilateral  carotid bruits Lungs: clear to auscultation bilaterally Heart: regular rate and rhythm, S1, S2 normal, no murmur, click, rub or gallop Extremities: extremities normal, atraumatic, no cyanosis or edema Pulses: 2+ and symmetric 2+ left pedal pulse Skin: Skin color, texture, turgor normal. No rashes or lesions Neurologic: Alert and oriented X 3, normal strength and tone. Normal symmetric reflexes. Normal coordination and gait  EKG normal sinus rhythm at 92 with low limb voltage.I  Personally reviewed this EKG.  ASSESSMENT AND PLAN:   Carotid artery stenosis History of right carotid endarterectomy back in September 2013. She also had left subclavian artery stenting 2001 with angioplasty in 2005. Carotid Dopplers performed 06/21/16 revealed a widely patent endarterectomy site, moderate left ICA stenosis moderate left subclavian stent restenosis although his upper extremity blood pressures are symmetric.  PAD (peripheral artery disease) (HCC) History of CAD with critical limb ischemia status post PTCA and stenting of a long left SFA CTO by myself 06/03/16. His symptoms resolved after that and his most recent Dopplers performed 01/05/17 revealed a widely patent left SFA stent with abnormal ABIs bilaterally. He denies claudication.      Lorretta Harp MD FACP,FACC,FAHA, [facility] 01/09/2017 11:22 AM

## 2017-01-09 NOTE — Assessment & Plan Note (Signed)
History of CAD with critical limb ischemia status post PTCA and stenting of a long left SFA CTO by myself 06/03/16. His symptoms resolved after that and his most recent Dopplers performed 01/05/17 revealed a widely patent left SFA stent with abnormal ABIs bilaterally. He denies claudication.

## 2017-01-09 NOTE — Assessment & Plan Note (Signed)
History of right carotid endarterectomy back in September 2013. She also had left subclavian artery stenting 2001 with angioplasty in 2005. Carotid Dopplers performed 06/21/16 revealed a widely patent endarterectomy site, moderate left ICA stenosis moderate left subclavian stent restenosis although his upper extremity blood pressures are symmetric.

## 2017-01-09 NOTE — Patient Instructions (Signed)
Medication Instructions: Your physician recommends that you continue on your current medications as directed. Please refer to the Current Medication list given to you today.  Testing/Procedures: Your physician has requested that you have a carotid duplex (June 2019). This test is an ultrasound of the carotid arteries in your neck. It looks at blood flow through these arteries that supply the brain with blood. Allow one hour for this exam. There are no restrictions or special instructions.  In January 2020: Your physician has requested that you have a lower extremity arterial duplex. During this test, ultrasound is used to evaluate arterial blood flow in the legs. Allow one hour for this exam. There are no restrictions or special instructions.  Your physician has requested that you have an ankle brachial index (ABI). During this test an ultrasound and blood pressure cuff are used to evaluate the arteries that supply the arms and legs with blood. Allow thirty minutes for this exam. There are no restrictions or special instructions.  Follow-Up: Your physician wants you to follow-up in: 1 year with Dr. Gwenlyn Found. You will receive a reminder letter in the mail two months in advance. If you don't receive a letter, please call our office to schedule the follow-up appointment.  If you need a refill on your cardiac medications before your next appointment, please call your pharmacy.

## 2017-01-21 ENCOUNTER — Other Ambulatory Visit: Payer: Self-pay | Admitting: Cardiovascular Disease

## 2017-01-25 ENCOUNTER — Other Ambulatory Visit: Payer: Self-pay | Admitting: Cardiovascular Disease

## 2017-01-25 DIAGNOSIS — I739 Peripheral vascular disease, unspecified: Secondary | ICD-10-CM

## 2017-02-20 ENCOUNTER — Encounter: Payer: PPO | Admitting: Cardiovascular Disease

## 2017-02-21 DIAGNOSIS — F0390 Unspecified dementia without behavioral disturbance: Secondary | ICD-10-CM | POA: Diagnosis not present

## 2017-02-21 DIAGNOSIS — J209 Acute bronchitis, unspecified: Secondary | ICD-10-CM | POA: Diagnosis not present

## 2017-02-28 ENCOUNTER — Encounter: Payer: Self-pay | Admitting: Cardiovascular Disease

## 2017-02-28 ENCOUNTER — Ambulatory Visit (INDEPENDENT_AMBULATORY_CARE_PROVIDER_SITE_OTHER): Payer: PPO | Admitting: Cardiovascular Disease

## 2017-02-28 VITALS — BP 122/60 | HR 98 | Ht 66.0 in | Wt 99.0 lb

## 2017-02-28 DIAGNOSIS — E7800 Pure hypercholesterolemia, unspecified: Secondary | ICD-10-CM

## 2017-02-28 DIAGNOSIS — I480 Paroxysmal atrial fibrillation: Secondary | ICD-10-CM | POA: Diagnosis not present

## 2017-02-28 DIAGNOSIS — I771 Stricture of artery: Secondary | ICD-10-CM

## 2017-02-28 DIAGNOSIS — I2510 Atherosclerotic heart disease of native coronary artery without angina pectoris: Secondary | ICD-10-CM

## 2017-02-28 DIAGNOSIS — K55059 Acute (reversible) ischemia of intestine, part and extent unspecified: Secondary | ICD-10-CM | POA: Diagnosis not present

## 2017-02-28 DIAGNOSIS — E43 Unspecified severe protein-calorie malnutrition: Secondary | ICD-10-CM | POA: Diagnosis not present

## 2017-02-28 DIAGNOSIS — Z950 Presence of cardiac pacemaker: Secondary | ICD-10-CM | POA: Diagnosis not present

## 2017-02-28 DIAGNOSIS — Z7901 Long term (current) use of anticoagulants: Secondary | ICD-10-CM | POA: Diagnosis not present

## 2017-02-28 DIAGNOSIS — I10 Essential (primary) hypertension: Secondary | ICD-10-CM

## 2017-02-28 DIAGNOSIS — I6523 Occlusion and stenosis of bilateral carotid arteries: Secondary | ICD-10-CM

## 2017-02-28 DIAGNOSIS — K551 Chronic vascular disorders of intestine: Secondary | ICD-10-CM

## 2017-02-28 NOTE — Progress Notes (Signed)
Patient ID: Thomas Snow, male   DOB: 23-Sep-1934, 83 y.o.   MRN: [id number]     Cardiology Office Note    Date:  02/01/2015   ID:  Thomas Snow, DOB 1934/12/11, MRN [id number]  PCP:  Thomas Snow  Cardiologist:   Thomas Snow, Thomas Snow   Chief Complaint  Patient presents with  . Follow-up    no chest pain, no shortness of breath, no edema, occassional pain or cramping in legs, no lightheadedness or dizziness    History of Present Illness:  Thomas Snow is a 83 y.o. male with a history of intermittent sinus node arrest and recurrent syncope that led to pacemaker implantation in 1985. He has paroxysmal atrial fibrillation and is on chronic anticoagulation with Xarelto. He also has an extensive history of atherosclerotic complications in the coronary, carotid, extremity, renal and mesenteric beds.  He presents for follow-up on pacemaker and anticoagulation.   According to his wife his dementia is continued to worsen.  He is also steadily losing weight and is now markedly underweight with a BMI of only 16, weight under 100 pounds.  The patient specifically denies any chest pain at rest exertion, dyspnea at rest or with exertion, orthopnea, paroxysmal nocturnal dyspnea, syncope, palpitations, focal neurological deficits, intermittent claudication, lower extremity edema, unexplained weight gain, cough, hemoptysis or wheezing.  Pacemaker interrogation shows normal device function. His atrial and ventricular leads are still the original 5 mm tip unipolar leads placed in 1985. His generator is a [employer] risk [employer] DR device implanted in 2006 and still has 2.25-6.75 years of estimated longevity. He only has 11% atrial pacing and 1.3% ventricular pacing. The burden of atrial arrhythmia appears to be very low , under 1%.  His device is not capable of recording electrograms, but the atrial rates suggest atrial flutter or atrial fibrillation.  He had cutting balloon angioplasty for a right  coronary artery stenosis in 2001, without stent placement. Ischemia was detected by nuclear testing and he never had angina or dyspnea. He remains asymptomatic and had a low-risk nuclear study in 2012. He has preserved left ventricular systolic function.  He received a stent to the left subclavian artery for subclavian steal syndrome in 2001. He is now asymptomatic from the standpoint and his most recent ultrasound shows moderate bilateral subclavian artery stenosis.  He received a stent to the left renal artery in 2005. His most recent ultrasound shows mild bilateral renal artery stenosis, less than 60%.  He had a transient ischemic attack in 2013 and underwent a right carotid endarterectomy.  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  He is intolerant to statins including both atorvastatin and rosuvastatin.   Past Medical History  Diagnosis Date  . Hyperlipidemia   . Hypertension   . Pneumonia     as a child  . Pacemaker   . Arthritis   . Coronary artery disease     prox and mid RCA atherectomy '01  . Carotid artery occlusion     left SCA stent '01, carotid occlusive dz    Past Surgical History  Procedure Laterality Date  . Kidney stone surgery    . Cardiac catheterization    . Coronary angioplasty    . Kidney stent      left RAS '05  . Tonsillectomy    . Back surgery      approx 20 years ago  . Eye  surgery      bilateral cataract removal  . Knee surgery      right knee  . Cardiac pacemaker placement      [place] '85, '95; [place] '06  . Endarterectomy  09/05/2011    Procedure: ENDARTERECTOMY CAROTID;  Surgeon: Mal Misty, Thomas Snow;  Location: [facility];  Service: Vascular;  Laterality: Right;  . Carotid endarterectomy Right 09-05-11    cea  . Peripheral vascular angiogram  01/22/2003    Left subclavian 85% fairly focal in-stent restenosis, dilated with a 7x2cm  Powerflex balloon at 10-30 and 10-30, resulting in less than 10% residual narrowing. Left renal artery demonstrated 80-85% initially dilated with a 4x15 mm coronary cutting balloon at 10-30 and 12-30, exchanged for a 50mmx2cm Aviator balloon inflations were done at 6-15, 10-30, and 10-30, resulting less than 10% residual.  . Peripheral vascular angiogram  07/13/1999    Left subclavian 85% dilated with a 59mmx2cm Cordis Power-Flex balloon at 6-40, a P-204 iliac stent was hand-crimped on a 29mmx2cm Cordis Power-Flex balloon, expanded at 8-40 with reduction to 0%. Left Renal artery 75-80% stenosis, dilated with a 76mmx1.5cm Cordis Power-Flex balloon at 6-40, reduction of stenosis from 80% to 0%.  . Renal doppler  09/25/2012    Celiac artery and SMA-demonstrates narrowing with increased velocities consistent with greater than 50%, right renal- 1-59% diameter reduction, left renal artery stent- 1-59% diameter reduction.  . Cardiac catheterization  03/09/1999    RCA high-grade 95% stenosis, inflated with a 3.25 IVT cutting balloon at 6-64, 6-41, 6-52, and 6-53, resulting in reductiong of less than 10%. RCA proximal 75% stenosis inflated with a 2.75x10 IVT cutting balloon at 6-65 then exchanged for a 3x10 IVT cutting balloon inflated at 6-83.  . Cardiovascular stress test  08/09/2010    Normal pattern of perfusion in all regions. No ECG changes. EKG negative for ischemia.  . Transthoracic echocardiogram  09-25-2012    EF 55-60%, mild-moderate tricuspid valve regurg  . Icd generator change  10/20/2004    Implantation of [facility] [place], model # 5357M/s, serial # [id number]    Outpatient Prescriptions Prior to Visit  Medication Sig Dispense Refill  . acetaminophen (TYLENOL) 325 MG tablet Take 650 mg by mouth 3 (three) times daily.    . fish oil-omega-3 fatty acids 1000 MG capsule Take 1 g by mouth daily.    . rivaroxaban (XARELTO) 20 MG TABS tablet Take 1 tablet (20 mg total) by mouth daily with supper.  (Patient taking differently: Take 20 mg by mouth at bedtime. ) 30 tablet 11  . cefUROXime (CEFTIN) 500 MG tablet Take 1 tablet (500 mg total) by mouth 2 (two) times daily with a meal. (Patient not taking: Reported on 02/01/2015) 6 tablet 0  . HYDROcodone-acetaminophen (NORCO/VICODIN) 5-325 MG per tablet Take 1 tablet by mouth every 6 (six) hours as needed. (Patient not taking: Reported on 02/01/2015) 15 tablet 0  . OVER THE COUNTER MEDICATION Take by mouth daily. Reported on 02/01/2015    . phenol (CHLORASEPTIC) 1.4 % LIQD Use as directed 1 Snow in the mouth or throat as needed for throat irritation / pain. (Patient not taking: Reported on 02/01/2015) 1 Bottle 0  . phenylephrine (SUDAFED PE) 10 MG TABS tablet Take 20 mg by mouth every 6 (six) hours as needed. Reported on 02/01/2015    . sodium chloride (OCEAN) 0.65 % SOLN nasal Snow Place 1 Snow into both nostrils as needed for congestion. Reported on 02/01/2015  No facility-administered medications prior to visit.     Allergies:   Atorvastatin and Crestor   Social History   Social History  . Marital Status: Married    Spouse Name: N/A  . Number of Children: N/A  . Years of Education: N/A   Social History Main Topics  . Smoking status: Former Smoker -- 0.50 packs/day for 25 years    Types: Cigarettes    Quit date: 08/19/1996  . Smokeless tobacco: Current User    Types: Snuff  . Alcohol Use: No  . Drug Use: No  . Sexual Activity: Not Asked   Other Topics Concern  . None   Social History Narrative     Family History:  The patient's family history includes Arthritis in his father.   ROS:   Please see the history of present illness.    ROS All other systems reviewed and are negative.   PHYSICAL EXAM:    Vitals:   02/28/17 1049  BP: 122/60  Pulse: 98  Weight: 99 lb (44.9 kg)  Height: 5\' 6"  (1.676 m)    General: Alert, oriented x3, no distress, cachectic Head: no evidence of trauma, PERRL, EOMI, no exophtalmos or lid  lag, no myxedema, no xanthelasma; normal ears, nose and oropharynx Neck: normal jugular venous pulsations and no hepatojugular reflux; brisk carotid pulses without delay and no carotid bruits Chest: clear to auscultation, no signs of consolidation by percussion or palpation, normal fremitus, symmetrical and full respiratory excursions; the right subclavian pacemaker site looks healthy. There is extensive collateral venous circulation overlying the right anterior chest and shoulder, suggestive of right subclavian vein occlusion with collateral formation. Cardiovascular: normal position and quality of the apical impulse, regular rhythm, normal first and second heart sounds, no murmurs, rubs or gallops Abdomen: no tenderness or distention, no masses by palpation, no abnormal pulsatility or arterial bruits, normal bowel sounds, no hepatosplenomegaly Extremities: no clubbing, cyanosis or edema; 2+ radial, ulnar and brachial pulses bilaterally; 2+ right femoral, posterior tibial and dorsalis pedis pulses; 2+ left femoral, posterior tibial and dorsalis pedis pulses; no subclavian or femoral bruits Neurological: grossly nonfocal Psych: Normal mood and affect   Wt Readings from Last 3 Encounters:  02/01/15 49.045 kg (108 lb 2 oz)  08/10/14 45.768 kg (100 lb 14.4 oz)  02/08/14 50.44 kg (111 lb 3.2 oz)      Studies/Labs Reviewed:   EKG:  EKG is ordered today.  The ekg ordered today sinus rhythm with premature atrial contractions, otherwise normal, QTC 421 ms  BMET    Component Value Date/Time   NA 140 06/05/2016 0320   K 3.6 06/05/2016 0320   CL 106 06/05/2016 0320   CO2 23 06/05/2016 0320   GLUCOSE 158 (H) 06/05/2016 0320   BUN 14 06/05/2016 0320   CREATININE 1.62 (H) 06/05/2016 0320   CREATININE 1.60 (H) 05/30/2016 1122   CALCIUM 8.5 (L) 06/05/2016 0320   GFRNONAA 38 (L) 06/05/2016 0320   GFRNONAA 40 (L) 05/30/2016 1122   GFRAA 45 (L) 06/05/2016 0320   GFRAA 46 (L) 05/30/2016 1122   Lipid  Panel     Component Value Date/Time   CHOL 168 06/18/2012 1043   TRIG 183 (H) 06/18/2012 1043   HDL 39 (L) 06/18/2012 1043   CHOLHDL 4.3 06/18/2012 1043   VLDL 37 06/18/2012 1043   LDLCALC 92 06/18/2012 1043     Lipid Panel    Component Value Date/Time   CHOL 168 06/18/2012 1043   TRIG 183* 06/18/2012 1043  HDL 39* 06/18/2012 1043   CHOLHDL 4.3 06/18/2012 1043   VLDL 37 06/18/2012 1043   LDLCALC 92 06/18/2012 1043    ASSESSMENT:     1. Paroxysmal atrial fibrillation (HCC)   2. Pacemaker   3. Long term (current) use of anticoagulants   4. Bilateral carotid artery stenosis   5. Subclavian arterial stenosis (HCC)   6. Mesenteric ischemia due to arterial insufficiency (HCC)   7. Coronary artery disease involving native coronary artery of native heart without angina pectoris   8. Essential hypertension   9. Hypercholesteremia   10. Severe protein-calorie malnutrition ([place])      PLAN:  In order of problems listed above:  1. Afib: arrhythmia burden appears to be low and the episodes are asymptomatic.  Rate control has not been a problem.   2. Xarelto: He is receiving adjusted the dose of Xarelto due to low GFR.  Serum creatinine overestimates his true renal function since he has lost so much muscle mass.  No bleeding problems. 3. PPM: Normally functioning dual-chamber device. He still has his original unipolar leads. It is highly likely that his right subclavian vein is occluded.  Manufacturing of generators that can take 5 mm pins has stopped.  When he will require a generator change out we will have to switch the whole system to the left side. His overall pacing requirements are very low and this is likely to not be an issue for several more years (probably >6 years).  If his problems with dementia and weight loss continue at the current pace, he may not survive that long.  His device is not amenable to remote downloads, he will be seen in the device clinic in 6  months 4. Carotid stenosis: No recent neurological symptoms 5. Bilateral subclavian artery stenosis he does not have limb claudication or symptoms of vertebral steal syndrome.  The blood pressure is equal in the right and left upper extremities and his true blood pressure may be underestimated due to bilateral stenoses.. 6. Celiac and SMA stenoses: He never described intestinal angina or symptoms of malabsorption.  No clear association between mesenteric insufficiency and weight loss.  His weight loss was improving last year, but is now recurring 7. CAD: No angina or any coronary events since his angioplasty more than 10 years ago. His most recent functional evaluation was a nuclear stress test in 2012 that did not show ischemia. He has normal left ventricular systolic function.  No plan for routine ischemia screening as long as he is asymptomatic. 8. HTN: Excellent blood pressure despite being off all of his antihypertensive medications, following massive weight loss 9. HLP:  Intolerant to statins. Again, due to his massive weight loss suspect current lipid profile will be more favorable. 10. Cachexia: His weight loss is extremely concerning.  I am afraid it may be singling a signal of terminal illness  Patient Instructions  Dr Thomas Snow recommends that you schedule a follow-up appointment in 6 months in our [place] clinic and 12 months. You will receive a reminder letter in the mail two months in advance. If you don't receive a letter, please call our office to schedule the follow-up appointment.  If you need a refill on your cardiac medications before your next appointment, please call your pharmacy.      Thomas Spray, Thomas Snow  02/01/2015 4:48 PM    [facility] [place] [address], [place], [place]  [address] Phone: [phone]; Fax: [phone]

## 2017-02-28 NOTE — Patient Instructions (Signed)
Dr Sallyanne Kuster recommends that you schedule a follow-up appointment in 6 months in our [place] clinic and 12 months. You will receive a reminder letter in the mail two months in advance. If you don't receive a letter, please call our office to schedule the follow-up appointment.  If you need a refill on your cardiac medications before your next appointment, please call your pharmacy.

## 2017-03-05 NOTE — Addendum Note (Signed)
Addended by: Diana Eves on: 03/05/2017 04:09 PM   Modules accepted: Orders

## 2017-03-13 LAB — CUP PACEART INCLINIC DEVICE CHECK
Implantable Lead Implant Date: 19850723
Implantable Lead Location: 753859
Implantable Pulse Generator Implant Date: 20061020
Lead Channel Setting Pacing Amplitude: 3.5 V
MDC IDC LEAD IMPLANT DT: 19850723
MDC IDC LEAD LOCATION: 753860
MDC IDC PG SERIAL: 1598850
MDC IDC SESS DTM: 20190313085817
MDC IDC SET LEADCHNL RA PACING AMPLITUDE: 2.5 V
MDC IDC SET LEADCHNL RV PACING PULSEWIDTH: 0.6 ms
MDC IDC SET LEADCHNL RV SENSING SENSITIVITY: 2.5 mV

## 2017-03-25 DIAGNOSIS — F0390 Unspecified dementia without behavioral disturbance: Secondary | ICD-10-CM | POA: Diagnosis not present

## 2017-03-25 DIAGNOSIS — I739 Peripheral vascular disease, unspecified: Secondary | ICD-10-CM | POA: Diagnosis not present

## 2017-03-25 DIAGNOSIS — I2510 Atherosclerotic heart disease of native coronary artery without angina pectoris: Secondary | ICD-10-CM | POA: Diagnosis not present

## 2017-03-25 DIAGNOSIS — N3941 Urge incontinence: Secondary | ICD-10-CM | POA: Diagnosis not present

## 2017-03-25 DIAGNOSIS — E119 Type 2 diabetes mellitus without complications: Secondary | ICD-10-CM | POA: Diagnosis not present

## 2017-03-25 DIAGNOSIS — I480 Paroxysmal atrial fibrillation: Secondary | ICD-10-CM | POA: Diagnosis not present

## 2017-04-12 ENCOUNTER — Encounter: Payer: Self-pay | Admitting: *Deleted

## 2017-04-15 ENCOUNTER — Encounter: Payer: Self-pay | Admitting: *Deleted

## 2017-04-16 ENCOUNTER — Telehealth: Payer: Self-pay | Admitting: Diagnostic Neuroimaging

## 2017-04-16 ENCOUNTER — Ambulatory Visit: Payer: PPO | Admitting: Diagnostic Neuroimaging

## 2017-04-16 ENCOUNTER — Encounter: Payer: Self-pay | Admitting: Diagnostic Neuroimaging

## 2017-04-16 VITALS — BP 138/58 | HR 87 | Ht 66.0 in | Wt 102.6 lb

## 2017-04-16 DIAGNOSIS — R413 Other amnesia: Secondary | ICD-10-CM

## 2017-04-16 DIAGNOSIS — F0390 Unspecified dementia without behavioral disturbance: Secondary | ICD-10-CM

## 2017-04-16 DIAGNOSIS — F03B0 Unspecified dementia, moderate, without behavioral disturbance, psychotic disturbance, mood disturbance, and anxiety: Secondary | ICD-10-CM

## 2017-04-16 MED ORDER — MEMANTINE HCL 10 MG PO TABS: 10.0000 mg | ORAL_TABLET | Freq: Two times a day (BID) | ORAL | 12 refills | Status: AC

## 2017-04-16 NOTE — Telephone Encounter (Signed)
Health team called me and informed me that the CT did not require auth.

## 2017-04-16 NOTE — Progress Notes (Signed)
[facility]  PATIENT: Thomas Snow DOB: 05-01-1934  REFERRING CLINICIAN: Ali Lowe, PA-c HISTORY FROM: patient and wife  REASON FOR VISIT: new consult    HISTORICAL  CHIEF COMPLAINT:  Chief Complaint  Patient presents with  . Dementia    rm 7, New Pt, wifeJoaquim Lai  MMSE 13    HISTORY OF PRESENT ILLNESS:   83 year old male here for evaluation of memory loss.  For past 6 months patient has gradual onset progressive short-term memory loss, confusion, decreased activity.  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  Patient does not remember what year he retired.  According to wife he retired at 62 years old.  He has had decreased energy and interest in his activities lately.  Patient having trouble with driving directions.  Patient having trouble with his medications.  He is able to maintain his own independent activities of daily living such as bathing, dressing, toileting, feeding and Katz index score of 6.  [place] instrumental activities of daily living score is 3.  He needs to be accompanied for any shopping trips.  Travel is limited to using a taxi or another driver.  He is able to heat and prepare simple meals but he is losing weight and does not eat regularly.  He is not capable of maintaining his own medications.  Patient was diagnosed with possible dementia by PCP and started on donepezil.  No side effects.  No improvement in symptoms.  No agitation, behavior changes, depression or anxiety.    REVIEW OF SYSTEMS: Full 14 system review of systems performed and negative with exception of: Weight loss fatigue memory loss restless legs feeling cold joint pain urination problems decreased energy disinterest activities.  History of pacemaker and cardiac stents.   ALLERGIES: Allergies  Allergen Reactions  . Atorvastatin     Leg pain  . Crestor  [Rosuvastatin]     Leg pain    HOME MEDICATIONS: Outpatient Medications Prior to Visit  Medication Sig Dispense Refill  . clopidogrel (PLAVIX) 75 MG tablet Take 1 tablet (75 mg total) by mouth daily with breakfast. 30 tablet 6  . donepezil (ARICEPT) 23 MG TABS tablet 23 mg daily.  6  . mirtazapine (REMERON) 15 MG tablet Take 0.5 tablets by mouth at bedtime.    Alveda Reasons 15 MG TABS tablet TAKE 1 TABLET BY MOUTH DAILY WITH SUPPER 90 tablet 1  . acetaminophen (TYLENOL) 325 MG tablet Take 650 mg by mouth 3 (three) times daily.    Marland Kitchen donepezil (ARICEPT) 10 MG tablet Take 10 mg by mouth every morning.     No facility-administered medications prior to visit.     PAST MEDICAL HISTORY: Past Medical History:  Diagnosis Date  . AICD (automatic cardioverter/defibrillator) present   . Arthritis    "my back hurts right much" (06/04/2016)  . Carotid artery occlusion    left SCA stent '01, carotid occlusive dz  . Chronic lower back pain   . Coronary artery disease    prox and mid RCA atherectomy '01  . Dementia    "don't know what stage" (06/04/2016)  . GERD (gastroesophageal reflux disease)   . History of kidney stones   . Hyperlipidemia   . Hypertension   . Pneumonia    as a child and again in 02/2014  (06/04/2016)  . Urinary hesitancy     PAST SURGICAL HISTORY: Past Surgical History:  Procedure Laterality Date  . BACK SURGERY    . CARDIAC CATHETERIZATION    . CARDIAC CATHETERIZATION  03/09/1999   RCA high-grade 95% stenosis, inflated with a 3.25 IVT cutting balloon at 6-64, 6-41, 6-52, and 6-53, resulting in reductiong of less than 10%. RCA proximal 75% stenosis inflated with a 2.75x10 IVT cutting balloon at 6-65 then exchanged for a 3x10 IVT cutting balloon inflated at 6-83.  Marland Kitchen CARDIAC PACEMAKER PLACEMENT     [place] '85, '95; [place] '06  . CARDIOVASCULAR STRESS TEST  08/09/2010   Normal pattern of perfusion in all regions. No ECG changes. EKG negative for ischemia.  Marland Kitchen CAROTID  ENDARTERECTOMY Right 09-05-11   cea  . CATARACT EXTRACTION, BILATERAL Bilateral   . CORONARY ANGIOPLASTY    . ENDARTERECTOMY  09/05/2011   Procedure: ENDARTERECTOMY CAROTID;  Surgeon: Mal Misty, MD;  Location: [place];  Service: Vascular;  Laterality: Right;  . ICD GENERATOR CHANGE  10/20/2004   Implantation of [facility], model # 5357M/s, serial # [id number]  . kidney stent Left    left RAS '05  . KIDNEY STONE SURGERY     "between kidney and bladder; cut it then had to open him up to get it back together"  . KNEE SURGERY Right    "forgot why/what they done"  . LOWER EXTREMITY INTERVENTION N/A 06/04/2016   Procedure: Lower Extremity Intervention;  Surgeon: Lorretta Harp, MD;  Location: [place] [facility];  Service: Cardiovascular;  Laterality: N/A;  . LUMBAR [place] SURGERY  1990s  . PERIPHERAL VASCULAR ANGIOGRAM  01/22/2003   Left subclavian 85% fairly focal in-stent restenosis, dilated with a 7x2cm Powerflex balloon at 10-30 and 10-30, resulting in less than 10% residual narrowing. Left renal artery demonstrated 80-85% initially dilated with a 4x15 mm coronary cutting balloon at 10-30 and 12-30, exchanged for a 78mmx2cm Aviator balloon inflations were done at 6-15, 10-30, and 10-30, resulting less than 10% residual.  . PERIPHERAL VASCULAR ANGIOGRAM  07/13/1999   Left subclavian 85% dilated with a 71mmx2cm Cordis Power-Flex balloon at 6-40, a P-204 iliac stent was hand-crimped on a 43mmx2cm Cordis Power-Flex balloon, expanded at 8-40 with reduction to 0%. Left Renal artery 75-80% stenosis, dilated with a 9mmx1.5cm Cordis Power-Flex balloon at 6-40, reduction of stenosis from 80% to 0%.  Marland Kitchen PERIPHERAL VASCULAR INTERVENTION  06/04/2016   Procedure: Peripheral Vascular Intervention;  Surgeon: Lorretta Harp, MD;  Location: [place] [facility];  Service: Cardiovascular;;  SFA  . RENAL DOPPLER  09/25/2012   Celiac artery and SMA-demonstrates narrowing with increased velocities consistent  with greater than 50%, right renal- 1-59% diameter reduction, left renal artery stent- 1-59% diameter reduction.  . TONSILLECTOMY    . TRANSTHORACIC ECHOCARDIOGRAM  09-25-2012   EF 55-60%, mild-moderate tricuspid valve regurg    FAMILY HISTORY: Family History  Problem Relation Age of Onset  . Heart disease Mother   . Arthritis Father     SOCIAL HISTORY:  Social History   Socioeconomic History  . Marital status: Married    Spouse name: [name]  . Number of children: Not on file  . Years of education: Not on file  . Highest education level: Not on file  Occupational History    Comment: retired Arboriculturist work  Social Needs  . Financial resource strain: Not on file  . Food insecurity:    Worry: Not on file    Inability: Not on file  . Transportation needs:  Medical: Not on file    Non-medical: Not on file  Tobacco Use  . Smoking status: Former Smoker    Packs/day: 0.50    Years: 25.00    Pack years: 12.50    Types: Cigarettes    Last attempt to quit: 08/19/1996    Years since quitting: 20.6  . Smokeless tobacco: Current User    Types: Snuff, Chew  Substance and Sexual Activity  . Alcohol use: No  . Drug use: No  . Sexual activity: Yes  Lifestyle  . Physical activity:    Days per week: Not on file    Minutes per session: Not on file  . Stress: Not on file  Relationships  . Social connections:    Talks on phone: Not on file    Gets together: Not on file    Attends religious service: Not on file    Active member of club or organization: Not on file    Attends meetings of clubs or organizations: Not on file    Relationship status: Not on file  . Intimate partner violence:    Fear of current or ex partner: Not on file    Emotionally abused: Not on file    Physically abused: Not on file    Forced sexual activity: Not on file  Other Topics Concern  . Not on file  Social History Narrative   Lives with wife, retired    High school   Children 3       [place] EXAM/CONSTITUTIONAL: Vitals:  Vitals:   04/16/17 1254  BP: (!) 138/58  Pulse: 87  Weight: 102 lb 9.6 oz (46.5 kg)  Height: 5\' 6"  (1.676 m)     Body mass index is 16.56 kg/m.  Visual Acuity Screening   Right eye Left eye Both eyes  Without correction:     With correction: 20/30 20/40      Patient is in no distress; well developed, nourished and groomed; neck is supple  CARDIOVASCULAR:  Examination of carotid arteries is normal; no carotid bruits  Regular rate and rhythm, no murmurs  Examination of peripheral vascular system by observation and palpation is normal  EYES:  Ophthalmoscopic exam of optic discs and posterior segments is normal; no papilledema or hemorrhages  MUSCULOSKELETAL:  Gait, strength, tone, movements noted in Neurologic exam below  NEUROLOGIC: MENTAL STATUS:  MMSE - Mini Mental State Exam 04/16/2017  Orientation to time 1  Orientation to Place 1  Registration 3  Attention/ Calculation 0  Recall 0  Language- name 2 objects 2  Language- repeat 1  Language- follow 3 step command 3  Language- read & follow direction 1  Write a sentence 1  Copy design 0  Total score 13    awake, alert, oriented to person, place and time  recent and remote memory intact  normal attention and concentration  language fluent, comprehension intact, naming intact,   fund of knowledge appropriate  CRANIAL NERVE:   2nd - no papilledema on fundoscopic exam  2nd, 3rd, 4th, 6th - pupils equal and reactive to light, visual fields full to confrontation, extraocular muscles intact, no nystagmus  5th - facial sensation symmetric  7th - facial strength symmetric  8th - hearing intact  9th - palate elevates symmetrically, uvula midline  11th - shoulder shrug symmetric  12th - tongue protrusion midline  MOTOR:   normal bulk and tone, full strength in the BUE, BLE; EXCEPT MILD RIGHT FOOT DROP  SENSORY:  normal and  symmetric to light touch, temperature, vibration; ABSENT VIB AT TOES  COORDINATION:   finger-nose-finger, fine finger movements normal  REFLEXES:   deep tendon reflexes TRACE and symmetric  GAIT/STATION:   narrow based gait; MILD RIGHT FOOT DROP    DIAGNOSTIC DATA (LABS, IMAGING, TESTING) - I reviewed patient records, labs, notes, testing and imaging myself where available.  Lab Results  Component Value Date   WBC 14.0 (H) 06/05/2016   HGB 13.1 06/05/2016   HCT 39.4 06/05/2016   MCV 87.6 06/05/2016   PLT 238 06/05/2016      Component Value Date/Time   NA 140 06/05/2016 0320   K 3.6 06/05/2016 0320   CL 106 06/05/2016 0320   CO2 23 06/05/2016 0320   GLUCOSE 158 (H) 06/05/2016 0320   BUN 14 06/05/2016 0320   CREATININE 1.62 (H) 06/05/2016 0320   CREATININE 1.60 (H) 05/30/2016 1122   CALCIUM 8.5 (L) 06/05/2016 0320   PROT 6.9 02/06/2014 1140   ALBUMIN 3.6 02/06/2014 1140   AST 36 02/06/2014 1140   ALT 14 02/06/2014 1140   ALKPHOS 58 02/06/2014 1140   BILITOT 0.6 02/06/2014 1140   GFRNONAA 38 (L) 06/05/2016 0320   GFRNONAA 40 (L) 05/30/2016 1122   GFRAA 45 (L) 06/05/2016 0320   GFRAA 46 (L) 05/30/2016 1122   Lab Results  Component Value Date   CHOL 168 06/18/2012   HDL 39 (L) 06/18/2012   LDLCALC 92 06/18/2012   TRIG 183 (H) 06/18/2012   CHOLHDL 4.3 06/18/2012   Lab Results  Component Value Date   HGBA1C 7.0 (H) 02/06/2014   No results found for: VITAMINB12 Lab Results  Component Value Date   TSH 3.10 05/30/2016     02/06/14 CT head [I reviewed images myself and agree with interpretation. -VRP]  - No acute intracranial findings. - Old high left frontal infarct as well as chronic ischemic microvascular disease. - Moderate chronic sinus inflammatory disease predominately involving the left maxillary sinus.     ASSESSMENT AND PLAN  83 y.o. year old male here with gradual onset progressive short-term memory loss and confusion consistent with  neurodegenerative dementia.  MMSE 15 out of 30.  ADLs have been affected.  Dx:  1. Moderate dementia without behavioral disturbance   2. Memory loss      PLAN:  - check CT head (rule out secondary causes) - continue donepezil 23mg  daily - add memantine 10mg  twice a day  - dementia and caregiver resources provided  Orders Placed This Encounter  Procedures  . CT HEAD WO CONTRAST   Meds ordered this encounter  Medications  . memantine (NAMENDA) 10 MG tablet    Sig: Take 1 tablet (10 mg total) by mouth 2 (two) times daily.    Dispense:  60 tablet    Refill:  12   Return if symptoms worsen or fail to improve, for return to PCP.    Penni Bombard, MD 0/10/2723, 3:66 PM Certified in Neurology, Neurophysiology and Neuroimaging  [facility] [address] [place], [place] [address] [phone]

## 2017-04-16 NOTE — Telephone Encounter (Signed)
Health team faxed clinical notes order sent to GI. They will reach out to the pt to schedule.

## 2017-04-29 ENCOUNTER — Ambulatory Visit
Admission: RE | Admit: 2017-04-29 | Discharge: 2017-04-29 | Disposition: A | Discharge status: Home / Self Care | Payer: PPO | Source: Ambulatory Visit | Attending: Diagnostic Neuroimaging | Admitting: Diagnostic Neuroimaging

## 2017-04-29 DIAGNOSIS — R413 Other amnesia: Secondary | ICD-10-CM

## 2017-04-30 ENCOUNTER — Telehealth: Payer: Self-pay | Admitting: *Deleted

## 2017-04-30 NOTE — Telephone Encounter (Signed)
LVM informing CT head shows there is nothing acute or new on CT of the head, no reversible causes of dementia seen. Advised there is volume loss in the areas of the brain we associate with memory which can be seen in dementia. Otherwise there are no changes from CT of the head in 2016. Left number for any questions.

## 2017-06-12 ENCOUNTER — Ambulatory Visit (HOSPITAL_COMMUNITY)
Admission: RE | Admit: 2017-06-12 | Discharge: 2017-06-12 | Disposition: A | Discharge status: Home / Self Care | Payer: PPO | Source: Ambulatory Visit | Attending: Cardiology | Admitting: Cardiology

## 2017-06-12 DIAGNOSIS — I6523 Occlusion and stenosis of bilateral carotid arteries: Secondary | ICD-10-CM | POA: Diagnosis not present

## 2017-06-12 DIAGNOSIS — I10 Essential (primary) hypertension: Secondary | ICD-10-CM | POA: Diagnosis not present

## 2017-06-12 DIAGNOSIS — I708 Atherosclerosis of other arteries: Secondary | ICD-10-CM | POA: Insufficient documentation

## 2017-06-12 DIAGNOSIS — E785 Hyperlipidemia, unspecified: Secondary | ICD-10-CM | POA: Insufficient documentation

## 2017-06-12 DIAGNOSIS — I739 Peripheral vascular disease, unspecified: Secondary | ICD-10-CM | POA: Insufficient documentation

## 2017-06-12 DIAGNOSIS — Z87891 Personal history of nicotine dependence: Secondary | ICD-10-CM | POA: Diagnosis not present

## 2017-06-12 DIAGNOSIS — Z9889 Other specified postprocedural states: Secondary | ICD-10-CM | POA: Insufficient documentation

## 2017-06-12 DIAGNOSIS — I2510 Atherosclerotic heart disease of native coronary artery without angina pectoris: Secondary | ICD-10-CM | POA: Insufficient documentation

## 2017-06-13 ENCOUNTER — Other Ambulatory Visit: Payer: Self-pay | Admitting: *Deleted

## 2017-06-13 DIAGNOSIS — I6523 Occlusion and stenosis of bilateral carotid arteries: Secondary | ICD-10-CM

## 2017-06-13 NOTE — Progress Notes (Signed)
vas 

## 2017-06-19 DIAGNOSIS — C44719 Basal cell carcinoma of skin of left lower limb, including hip: Secondary | ICD-10-CM | POA: Diagnosis not present

## 2017-06-19 DIAGNOSIS — C44319 Basal cell carcinoma of skin of other parts of face: Secondary | ICD-10-CM | POA: Diagnosis not present

## 2017-06-19 DIAGNOSIS — D485 Neoplasm of uncertain behavior of skin: Secondary | ICD-10-CM | POA: Diagnosis not present

## 2017-08-02 ENCOUNTER — Other Ambulatory Visit: Payer: Self-pay | Admitting: Cardiovascular Disease

## 2017-08-11 ENCOUNTER — Other Ambulatory Visit: Payer: Self-pay | Admitting: Cardiovascular Disease

## 2017-08-12 NOTE — Telephone Encounter (Signed)
Rx request sent to pharmacy.  

## 2017-09-05 ENCOUNTER — Telehealth: Payer: Self-pay | Admitting: Cardiovascular Disease

## 2017-09-05 MED ORDER — RIVAROXABAN 15 MG PO TABS: ORAL_TABLET | ORAL | 0 refills | Status: DC

## 2017-09-05 NOTE — Telephone Encounter (Signed)
Follow Up:    Patient returning call.

## 2017-09-05 NOTE — Telephone Encounter (Signed)
New message:     Pt c/o medication issue:  1. Name of Medication: Xarelto/Plavix  2. How are you currently taking this medication (dosage and times per day)?   3. Are you having a reaction (difficulty breathing--STAT)?   4. What is your medication issue? Pt's wife states they are now in the donut hole with these medications. More so with Xarelto than the plavix

## 2017-09-05 NOTE — Telephone Encounter (Signed)
Returned call to patient no answer.Unable to leave message no voice mail. 

## 2017-09-05 NOTE — Telephone Encounter (Signed)
Spoke to wife she states they will not be able to purchase Xarelto 15 mg . It cost $160.  RN informed wife --- will have patient assistance form.available to fill out and send. Also informed wife- about [facility]- for plavix. Wife aware will defer to Dr C for other options

## 2017-09-06 NOTE — Telephone Encounter (Signed)
It's very likely that the higher cost is due to the "doughnut hole" in Medicare part D and is a temporary increase in price. There are no good alternatives that would be any cheaper. Patient assistance is a good idea. MCr

## 2017-09-06 NOTE — Telephone Encounter (Signed)
Spoke with the patients wife. She is aware of Dr.Croitoru's response.  Croitoru, Mihai, MD   8:12 AM  Note    It's very likely that the higher cost is due to the "doughnut hole" in Medicare part D and is a temporary increase in price. There are no good alternatives that would be any cheaper. Patient assistance is a good idea. MCr       Pt wife sts that they have received the patient assistance form and she will complete and mail it directly to the company.  Adv her to contact us  if further assistance is needed. She voiced appreciation for the call.

## 2017-09-06 NOTE — Telephone Encounter (Signed)
When patient assistance form is completed bring to office for completeion . It will be given to  Dr C.'s medical assistant.

## 2017-09-06 NOTE — Telephone Encounter (Signed)
° ° °  Please return call to patient °

## 2017-09-06 NOTE — Telephone Encounter (Signed)
Left message to call back  

## 2017-09-10 DIAGNOSIS — C44319 Basal cell carcinoma of skin of other parts of face: Secondary | ICD-10-CM | POA: Diagnosis not present

## 2017-09-10 DIAGNOSIS — C44719 Basal cell carcinoma of skin of left lower limb, including hip: Secondary | ICD-10-CM | POA: Diagnosis not present

## 2017-09-30 DIAGNOSIS — Z1331 Encounter for screening for depression: Secondary | ICD-10-CM | POA: Diagnosis not present

## 2017-09-30 DIAGNOSIS — E7800 Pure hypercholesterolemia, unspecified: Secondary | ICD-10-CM | POA: Diagnosis not present

## 2017-09-30 DIAGNOSIS — Z23 Encounter for immunization: Secondary | ICD-10-CM | POA: Diagnosis not present

## 2017-09-30 DIAGNOSIS — Z1339 Encounter for screening examination for other mental health and behavioral disorders: Secondary | ICD-10-CM | POA: Diagnosis not present

## 2017-09-30 DIAGNOSIS — I739 Peripheral vascular disease, unspecified: Secondary | ICD-10-CM | POA: Diagnosis not present

## 2017-09-30 DIAGNOSIS — F0390 Unspecified dementia without behavioral disturbance: Secondary | ICD-10-CM | POA: Diagnosis not present

## 2017-09-30 DIAGNOSIS — I2510 Atherosclerotic heart disease of native coronary artery without angina pectoris: Secondary | ICD-10-CM | POA: Diagnosis not present

## 2017-09-30 DIAGNOSIS — E119 Type 2 diabetes mellitus without complications: Secondary | ICD-10-CM | POA: Diagnosis not present

## 2017-09-30 DIAGNOSIS — I480 Paroxysmal atrial fibrillation: Secondary | ICD-10-CM | POA: Diagnosis not present

## 2017-10-30 DIAGNOSIS — J069 Acute upper respiratory infection, unspecified: Secondary | ICD-10-CM | POA: Diagnosis not present

## 2017-10-30 DIAGNOSIS — I480 Paroxysmal atrial fibrillation: Secondary | ICD-10-CM | POA: Diagnosis not present

## 2018-01-09 ENCOUNTER — Ambulatory Visit (HOSPITAL_COMMUNITY)
Admission: RE | Admit: 2018-01-09 | Discharge: 2018-01-09 | Disposition: A | Discharge status: Home / Self Care | Payer: PPO | Source: Ambulatory Visit | Attending: Cardiovascular Disease | Admitting: Cardiovascular Disease

## 2018-01-09 ENCOUNTER — Other Ambulatory Visit: Payer: Self-pay | Admitting: *Deleted

## 2018-01-09 DIAGNOSIS — I739 Peripheral vascular disease, unspecified: Secondary | ICD-10-CM | POA: Diagnosis not present

## 2018-01-10 ENCOUNTER — Ambulatory Visit: Payer: PPO | Admitting: Cardiovascular Disease

## 2018-01-31 ENCOUNTER — Encounter: Payer: Self-pay | Admitting: Cardiovascular Disease

## 2018-01-31 ENCOUNTER — Ambulatory Visit (INDEPENDENT_AMBULATORY_CARE_PROVIDER_SITE_OTHER): Payer: PPO | Admitting: Cardiovascular Disease

## 2018-01-31 ENCOUNTER — Telehealth: Payer: Self-pay | Admitting: Cardiovascular Disease

## 2018-01-31 VITALS — BP 118/52 | HR 93 | Ht 66.0 in | Wt 116.0 lb

## 2018-01-31 DIAGNOSIS — I708 Atherosclerosis of other arteries: Secondary | ICD-10-CM | POA: Diagnosis not present

## 2018-01-31 DIAGNOSIS — I2510 Atherosclerotic heart disease of native coronary artery without angina pectoris: Secondary | ICD-10-CM

## 2018-01-31 DIAGNOSIS — I739 Peripheral vascular disease, unspecified: Secondary | ICD-10-CM

## 2018-01-31 DIAGNOSIS — I771 Stricture of artery: Secondary | ICD-10-CM | POA: Insufficient documentation

## 2018-01-31 DIAGNOSIS — I6523 Occlusion and stenosis of bilateral carotid arteries: Secondary | ICD-10-CM

## 2018-01-31 DIAGNOSIS — I10 Essential (primary) hypertension: Secondary | ICD-10-CM

## 2018-01-31 DIAGNOSIS — E119 Type 2 diabetes mellitus without complications: Secondary | ICD-10-CM | POA: Diagnosis not present

## 2018-01-31 DIAGNOSIS — I774 Celiac artery compression syndrome: Secondary | ICD-10-CM

## 2018-01-31 DIAGNOSIS — R636 Underweight: Secondary | ICD-10-CM | POA: Diagnosis not present

## 2018-01-31 DIAGNOSIS — Z950 Presence of cardiac pacemaker: Secondary | ICD-10-CM | POA: Diagnosis not present

## 2018-01-31 DIAGNOSIS — E782 Mixed hyperlipidemia: Secondary | ICD-10-CM | POA: Diagnosis not present

## 2018-01-31 DIAGNOSIS — I480 Paroxysmal atrial fibrillation: Secondary | ICD-10-CM

## 2018-01-31 DIAGNOSIS — Z7901 Long term (current) use of anticoagulants: Secondary | ICD-10-CM | POA: Diagnosis not present

## 2018-01-31 NOTE — Progress Notes (Signed)
01/31/2018 [name]   1934-02-26  [id number]  Primary Physician Thomas Bender, PA-C Primary Cardiologist: Thomas Harp MD Thomas Snow, [place], [place]  HPI:  Thomas Snow is a 84 y.o.  thin and frail appearing Caucasian male accompanied by his wife Thomas Snow. He is a patient of Dr. Sallyanne Snow. I last saw him in the office  01/09/2017.Marland Kitchen He has a history of peripheral arterial disease and coronary artery disease. He has a history of coronary intervention back in 2001, remote left subclavian stenting, pacemaker implantation with paroxysmal atrial fibrillation maintaining sinus rhythm on the Route oral anticoagulation. He does have a serum creatinine in the mid 1 range. ABIs performed regularly revealed a left ABI 0.35. He complains of chronic redness and pain in his left foot as well as left calf claudication. Dopplers performed 05/08/16 revealed a left ABI 0.3 with an occluded left SFA.Because of redness in his right foot and ongoing resting pain he was admitted for hydration on 06/02/16 and underwent angiography and intervention by myself the following day (06/03/16). I Ultimately put 2 Viabahn covered stents in his left SFA CTO. He had three-vessel runoff.His symptoms have completely resolved. ABI increased from 0.3 up to 0.94. Since I saw him in the office 6 months ago he's remained stable from a peripheral last report of view. He denies claudication. His recent Dopplers performed 01/09/2018 were completely normal. The patent left SFA stent and normal ABIs.    No outpatient medications have been marked as taking for the 01/31/18 encounter (Office Visit) with Thomas Harp, MD.     Allergies  Allergen Reactions  . Atorvastatin     Leg pain  . Crestor [Rosuvastatin]     Leg pain    Social History   Socioeconomic History  . Marital status: Married    Spouse name: Thomas Snow  . Number of children: Not on file  . Years of education: Not on file  . Highest education level: Not on  file  Occupational History    Comment: retired Arboriculturist work  Social Needs  . Financial resource strain: Not on file  . Food insecurity:    Worry: Not on file    Inability: Not on file  . Transportation needs:    Medical: Not on file    Non-medical: Not on file  Tobacco Use  . Smoking status: Former Smoker    Packs/day: 0.50    Years: 25.00    Pack years: 12.50    Types: Cigarettes    Last attempt to quit: 08/19/1996    Years since quitting: 21.4  . Smokeless tobacco: Current User    Types: Snuff, Chew  Substance and Sexual Activity  . Alcohol use: No  . Drug use: No  . Sexual activity: Yes  Lifestyle  . Physical activity:    Days per week: Not on file    Minutes per session: Not on file  . Stress: Not on file  Relationships  . Social connections:    Talks on phone: Not on file    Gets together: Not on file    Attends religious service: Not on file    Active member of club or organization: Not on file    Attends meetings of clubs or organizations: Not on file    Relationship status: Not on file  . Intimate partner violence:    Fear of current or ex partner: Not on file    Emotionally abused: Not on file  Physically abused: Not on file    Forced sexual activity: Not on file  Other Topics Concern  . Not on file  Social History Narrative   Lives with wife, retired    [employer] school   Children 3      Review of Systems: General: negative for chills, fever, night sweats or weight changes.  Cardiovascular: negative for chest pain, dyspnea on exertion, edema, orthopnea, palpitations, paroxysmal nocturnal dyspnea or shortness of breath Dermatological: negative for rash Respiratory: negative for cough or wheezing Urologic: negative for hematuria Abdominal: negative for nausea, vomiting, diarrhea, bright red blood per rectum, melena, or hematemesis Neurologic: negative for visual changes, syncope, or dizziness All other systems reviewed and are otherwise negative except  as noted above.    There were no vitals taken for this visit.  General appearance: alert and no distress Neck: no adenopathy, no JVD, supple, symmetrical, trachea midline, thyroid not enlarged, symmetric, no tenderness/mass/nodules and Left carotid bruit Lungs: clear to auscultation bilaterally Heart: regular rate and rhythm, S1, S2 normal, no murmur, click, rub or gallop Extremities: extremities normal, atraumatic, no cyanosis or edema Pulses: 2+ and symmetric Skin: Skin color, texture, turgor normal. No rashes or lesions Neurologic: Alert and oriented X 3, normal strength and tone. Normal symmetric reflexes. Normal coordination and gait  EKG not performed today  ASSESSMENT AND PLAN:   Carotid artery stenosis History of carotid artery disease with Dopplers performed 06/21/2016 revealing mild right and moderate left ICA stenosis.  He has had a left subclavian stent placed in 2001 with angioplasty in 2005.  Left subclavian velocities from moderately elevated although both vertebral artery blood flow were antegrade and blood pressures on both arms were fairly similar.  We will repeat carotid Doppler studies.  PAD (peripheral artery disease) (HCC) History of peripheral arterial disease status post left SFA PTA and covered stenting using 2 overlapping Viabahn covered stents for a CTO in the setting of critical ischemia with improvement in his left ABI initially from 0.3 up to 0.94.  His symptoms resolved.  Recent lower extremity Dopplers performed 01/09/2018 revealed a left ABI of 1.04 with widely patent stents.  The patient remains asymptomatic.      Thomas Harp MD FACP,FACC,FAHA, [facility] 01/31/2018 10:07 AM

## 2018-01-31 NOTE — Telephone Encounter (Signed)
Faxed patient assistance paperwork to [employer] for [place] (f) [phone]

## 2018-01-31 NOTE — Progress Notes (Signed)
Patient ID: Thomas Snow, male   DOB: 08-May-1934, 84 y.o.   MRN: [id number]     Cardiology Office Note    Date:  02/01/2015   ID:  Thomas Snow, DOB 09/30/1934, MRN [id number]  PCP:  Fae Pippin  Cardiologist:   Sanda Klein, MD   Chief Complaint  Patient presents with  . Follow-up    no chest pain, no shortness of breath, no edema, occassional pain or cramping in legs, no lightheadedness or dizziness    History of Present Illness:  Thomas Snow is a 84 y.o. male with a history of intermittent sinus node arrest and recurrent syncope that led to pacemaker implantation in 1985. He has paroxysmal atrial fibrillation and is on chronic anticoagulation with Xarelto. He also has an extensive history of atherosclerotic complications in the coronary, carotid, extremity, renal and mesenteric beds.  He presents for follow-up on pacemaker and anticoagulation.   Overall he seems to be doing better than a year ago.  He is gained back substantial weight although he remains underweight with a BMI of 18.  Memory problems persist, but he still pretty functional with his wife support.  He reportedly woke up at 3 AM with sharp chest pain a few nights ago but the symptoms resolved very quickly within a matter of a few seconds.  He does not remember the event.  He does not have exertional chest pain.  The patient specifically denies any chest pain at rest exertion, dyspnea at rest or with exertion, orthopnea, paroxysmal nocturnal dyspnea, syncope, palpitations, focal neurological deficits, intermittent claudication, lower extremity edema, unexplained weight gain, cough, hemoptysis or wheezing.  He denies problems with falls, injuries or bleeding.  Pacemaker interrogation shows normal device function. His atrial and ventricular leads are still the original 5 mm tip unipolar leads placed in 1985. His generator is a [employer] risk [employer] DR device implanted in 2006 and still has 2-6 years of  estimated longevity. He only has 9 % atrial pacing and 1.2     % ventricular pacing. The burden of atrial arrhythmia appears to be very low , under 1%.  His device is not capable of recording electrograms, so it is impossible to say whether he truly had brief episodes of atrial fibrillation or less of the chest electromechanical interference on his unipolar leads.  He had cutting balloon angioplasty for a right coronary artery stenosis in 2001, without stent placement. Ischemia was detected by nuclear testing and he never had angina or dyspnea. He remains asymptomatic and had a low-risk nuclear study in 2012. He has preserved left ventricular systolic function.  He received a stent to the left subclavian artery for subclavian steal syndrome in 2001. He is now asymptomatic from the standpoint and his most recent ultrasound shows moderate bilateral subclavian artery stenosis.  He received a stent to the left renal artery in 2005. His most recent ultrasound shows mild bilateral renal artery stenosis, less than 60%.  He had a transient ischemic attack in 2013 and underwent a right carotid endarterectomy.  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  He is intolerant to statins including both atorvastatin and rosuvastatin.   Past Medical History  Diagnosis Date  . Hyperlipidemia   . Hypertension   . Pneumonia     as a child  . Pacemaker   . Arthritis   . Coronary artery disease     prox  and mid RCA atherectomy '01  . Carotid artery occlusion     left SCA stent '01, carotid occlusive dz    Past Surgical History  Procedure Laterality Date  . Kidney stone surgery    . Cardiac catheterization    . Coronary angioplasty    . Kidney stent      left RAS '05  . Tonsillectomy    . Back surgery      approx 20 years ago  . Eye surgery      bilateral cataract removal  . Knee surgery        right knee  . Cardiac pacemaker placement      [place] '85, '95; [place] '06  . Endarterectomy  09/05/2011    Procedure: ENDARTERECTOMY CAROTID;  Surgeon: Mal Misty, MD;  Location: [facility];  Service: Vascular;  Laterality: Right;  . Carotid endarterectomy Right 09-05-11    cea  . Peripheral vascular angiogram  01/22/2003    Left subclavian 85% fairly focal in-stent restenosis, dilated with a 7x2cm Powerflex balloon at 10-30 and 10-30, resulting in less than 10% residual narrowing. Left renal artery demonstrated 80-85% initially dilated with a 4x15 mm coronary cutting balloon at 10-30 and 12-30, exchanged for a 53mmx2cm Aviator balloon inflations were done at 6-15, 10-30, and 10-30, resulting less than 10% residual.  . Peripheral vascular angiogram  07/13/1999    Left subclavian 85% dilated with a 70mmx2cm Cordis Power-Flex balloon at 6-40, a P-204 iliac stent was hand-crimped on a 93mmx2cm Cordis Power-Flex balloon, expanded at 8-40 with reduction to 0%. Left Renal artery 75-80% stenosis, dilated with a 32mmx1.5cm Cordis Power-Flex balloon at 6-40, reduction of stenosis from 80% to 0%.  . Renal doppler  09/25/2012    Celiac artery and SMA-demonstrates narrowing with increased velocities consistent with greater than 50%, right renal- 1-59% diameter reduction, left renal artery stent- 1-59% diameter reduction.  . Cardiac catheterization  03/09/1999    RCA high-grade 95% stenosis, inflated with a 3.25 IVT cutting balloon at 6-64, 6-41, 6-52, and 6-53, resulting in reductiong of less than 10%. RCA proximal 75% stenosis inflated with a 2.75x10 IVT cutting balloon at 6-65 then exchanged for a 3x10 IVT cutting balloon inflated at 6-83.  . Cardiovascular stress test  08/09/2010    Normal pattern of perfusion in all regions. No ECG changes. EKG negative for ischemia.  . Transthoracic echocardiogram  09-25-2012    EF 55-60%, mild-moderate tricuspid valve regurg  . Icd generator change  10/20/2004     Implantation of [facility], model # 5357M/s, serial # [id number]    Outpatient Prescriptions Prior to Visit  Medication Sig Dispense Refill  . acetaminophen (TYLENOL) 325 MG tablet Take 650 mg by mouth 3 (three) times daily.    . fish oil-omega-3 fatty acids 1000 MG capsule Take 1 g by mouth daily.    . rivaroxaban (XARELTO) 20 MG TABS tablet Take 1 tablet (20 mg total) by mouth daily with supper. (Patient taking differently: Take 20 mg by mouth at bedtime. ) 30 tablet 11  . cefUROXime (CEFTIN) 500 MG tablet Take 1 tablet (500 mg total) by mouth 2 (two) times daily with a meal. (Patient not taking: Reported on 02/01/2015) 6 tablet 0  . HYDROcodone-acetaminophen (NORCO/VICODIN) 5-325 MG per tablet Take 1 tablet by mouth every 6 (six) hours as needed. (Patient not taking: Reported on 02/01/2015) 15 tablet 0  . OVER THE COUNTER MEDICATION Take by mouth daily. Reported on 02/01/2015    .  phenol (CHLORASEPTIC) 1.4 % LIQD Use as directed 1 spray in the mouth or throat as needed for throat irritation / pain. (Patient not taking: Reported on 02/01/2015) 1 Bottle 0  . phenylephrine (SUDAFED PE) 10 MG TABS tablet Take 20 mg by mouth every 6 (six) hours as needed. Reported on 02/01/2015    . sodium chloride (OCEAN) 0.65 % SOLN nasal spray Place 1 spray into both nostrils as needed for congestion. Reported on 02/01/2015     No facility-administered medications prior to visit.     Allergies:   Atorvastatin and Crestor   Social History   Social History  . Marital Status: Married    Spouse Name: N/A  . Number of Children: N/A  . Years of Education: N/A   Social History Main Topics  . Smoking status: Former Smoker -- 0.50 packs/day for 25 years    Types: Cigarettes    Quit date: 08/19/1996  . Smokeless tobacco: Current User    Types: Snuff  . Alcohol Use: No  . Drug Use: No  . Sexual Activity: Not Asked   Other Topics Concern  . None   Social History Narrative     Family  History:  The patient's family history includes Arthritis in his father.   ROS:   Please see the history of present illness.    ROS All other systems reviewed and are negative.   PHYSICAL EXAM:    Vitals:   01/31/18 0852  BP: (!) 118/52  Pulse: 93  Weight: 116 lb (52.6 kg)  Height: 5\' 6"  (1.676 m)    General: Alert, oriented x3, no distress, very lean, borderline cachectic Head: no evidence of trauma, PERRL, EOMI, no exophtalmos or lid lag, no myxedema, no xanthelasma; normal ears, nose and oropharynx Neck: normal jugular venous pulsations and no hepatojugular reflux; brisk carotid pulses without delay but with prominent bilateral carotid bruits, scar of right carotid endarterectomy  Chest: clear to auscultation, no signs of consolidation by percussion or palpation, normal fremitus, symmetrical and full respiratory excursions Cardiovascular: normal position and quality of the apical impulse, regular rhythm, normal first and second heart sounds, no diastolic murmurs, rubs or gallops.  6-0/4 systolic murmur prominent vessels at the left lower sternal border.healthy-appearing left subclavian pacemaker site  Abdomen: no tenderness or distention, no masses by palpation, has widespread systolic bruits, normal bowel sounds, no hepatosplenomegaly Extremities: no clubbing, cyanosis or edema; 2+ radial, ulnar and brachial pulses bilaterally; 2+ right femoral, posterior tibial and dorsalis pedis pulses; 2+ left femoral, posterior tibial and dorsalis pedis pulses; bilateral subclavian and femoral bruits are present Neurological: grossly nonfocal Psych: Normal mood and affect   Wt Readings from Last 3 Encounters:  02/01/15 49.045 kg (108 lb 2 oz)  08/10/14 45.768 kg (100 lb 14.4 oz)  02/08/14 50.44 kg (111 lb 3.2 oz)      Studies/Labs Reviewed:   EKG: ECG today and shows sinus rhythm with frequent PVCs in a pattern of quadrigeminy, otherwise normal tracing.  QTc 420 ms BMET    Component  Value Date/Time   NA 140 06/05/2016 0320   K 3.6 06/05/2016 0320   CL 106 06/05/2016 0320   CO2 23 06/05/2016 0320   GLUCOSE 158 (H) 06/05/2016 0320   BUN 14 06/05/2016 0320   CREATININE 1.62 (H) 06/05/2016 0320   CREATININE 1.60 (H) 05/30/2016 1122   CALCIUM 8.5 (L) 06/05/2016 0320   GFRNONAA 38 (L) 06/05/2016 0320   GFRNONAA 40 (L) 05/30/2016 1122   GFRAA 45 (  L) 06/05/2016 0320   GFRAA 46 (L) 05/30/2016 1122   Lipid Panel     Component Value Date/Time   CHOL 168 06/18/2012 1043   TRIG 183 (H) 06/18/2012 1043   HDL 39 (L) 06/18/2012 1043   CHOLHDL 4.3 06/18/2012 1043   VLDL 37 06/18/2012 1043   LDLCALC 92 06/18/2012 1043     Lipid Panel    Component Value Date/Time   CHOL 168 06/18/2012 1043   TRIG 183* 06/18/2012 1043   HDL 39* 06/18/2012 1043   CHOLHDL 4.3 06/18/2012 1043   VLDL 37 06/18/2012 1043   LDLCALC 92 06/18/2012 1043    ASSESSMENT:     1. Paroxysmal atrial fibrillation (HCC)   2. Long term (current) use of anticoagulants   3. Pacemaker   4. Bilateral carotid artery stenosis   5. Stenosis of both subclavian arteries   6. Celiac artery stenosis (HCC)   7. Coronary artery disease involving native coronary artery of native heart without angina pectoris   8. Essential hypertension   9. Mixed hyperlipidemia   10. Diabetes mellitus type 2 in nonobese (HCC)   11. Underweight      PLAN:  In order of problems listed above:  1. Afib: arrhythmia burden is very low. 2. Xarelto: He is receiving adjusted the dose of Xarelto due to low GFR.  Serum creatinine overestimates his true renal function since he has lost so much muscle mass.  No bleeding problems.  I would have a very low threshold to discontinue his Xarelto if he starts having problems with falls or bleeding. 3. PPM: Normally functioning dual-chamber device.  He still has his original 5.6 mm unipolar leads from 1985 and there are no longer pacemaker generator is being manufactured that it attached to  the leads.  Thankfully, pacing requirements are very low and is possible that his pacemaker last for 6 years.  If we do to make him undergo a generator change out, we have to implant a whole new system from the contralateral side.  I do not think he is a candidate for lead extraction 4. Carotid stenosis: No recent neurological symptoms.  Previous right carotid endarterectomy.  On clopidogrel. 5. Bilateral subclavian artery stenosis he does not have limb claudication or symptoms of vertebral steal syndrome.  Today I recorded a blood pressure of 140 on the right side and 120 on the left side, both possibly underestimating his true blood pressure. 6. Celiac and SMA stenoses: He has never described intestinal angina.  He is actually gained quite a bit of weight since last year which is a good sign. 7. CAD: He denies angina pectoris.  Last functional study was a normal nuclear scan in 2012. 8. HTN: Only has borderline elevation in systolic blood pressure (assuming that are estimation of essential blood pressure is accurate).  He is not on any medications. 9. HLP: Despite no therapy, LDL cholesterol is pretty good at 73.  Surprisingly he has mild diabetes mellitus (hemoglobin A1c 6.6% without medications), mild hypertriglyceridemia and a low HDL cholesterol.  Would prefer continued management with diet and activity, rather than adding medications. 10. Cachexia: He remains underweight, but no longer appears frankly malnourished.  Surprisingly he has hypertriglyceridemia and hyperglycemia despite very lean body mass.  Patient Instructions  Medication Instructions:  Continue current medications If you need a refill on your cardiac medications before your next appointment, please call your pharmacy.   Follow-Up: At [facility], you and your health needs are our priority.  As  part of our continuing mission to provide you with exceptional heart care, we have created designated Provider Care Teams.  These Care  Teams include your primary Cardiologist (physician) and Advanced Practice Providers (APPs -  Physician Assistants and Nurse Practitioners) who all work together to provide you with the care you need, when you need it. You will need a follow up appointment in 6 months.  Please call our office 2 months in advance to schedule this appointment.  You may see Sanda Klein, MD or one of the following Advanced Practice Providers on your designated Care Team: [place], [place] . Fabian Sharp, PA-C  Any Other Special Instructions Will Be Listed Below (If Applicable).      Mikael Spray, MD  02/01/2015 4:48 PM    [facility] [place], [place], [place]  [address] Phone: [phone]; Fax: [phone]

## 2018-01-31 NOTE — Assessment & Plan Note (Signed)
History of carotid artery disease with Dopplers performed 06/21/2016 revealing mild right and moderate left ICA stenosis.  He has had a left subclavian stent placed in 2001 with angioplasty in 2005.  Left subclavian velocities from moderately elevated although both vertebral artery blood flow were antegrade and blood pressures on both arms were fairly similar.  We will repeat carotid Doppler studies.

## 2018-01-31 NOTE — Patient Instructions (Addendum)
Medication Instructions:  NONE If you need a refill on your cardiac medications before your next appointment, please call your pharmacy.   Lab work: NONE If you have labs (blood work) drawn today and your tests are completely normal, you will receive your results only by: Marland Kitchen MyChart Message (if you have MyChart) OR . A paper copy in the mail If you have any lab test that is abnormal or we need to change your treatment, we will call you to review the results.  Testing/Procedures: Your physician has requested that you have a carotid duplex. This test is an ultrasound of the carotid arteries in your neck. It looks at blood flow through these arteries that supply the brain with blood. Allow one hour for this exam. There are no restrictions or special instructions. DUE; SCHEDULE SOON  Your physician has requested that you have a lower or upper extremity arterial duplex. This test is an ultrasound of the arteries in the legs or arms. It looks at arterial blood flow in the legs and arms. Allow one hour for Lower and Upper Arterial scans. There are no restrictions or special instructions SCHEDULE JAN 2021  Your physician has requested that you have an ankle brachial index (ABI). During this test an ultrasound and blood pressure cuff are used to evaluate the arteries that supply the arms and legs with blood. Allow thirty minutes for this exam. There are no restrictions or special instructions. SCHEDULE JAN 2021   Follow-Up: At [facility], you and your health needs are our priority.  As part of our continuing mission to provide you with exceptional heart care, we have created designated Provider Care Teams.  These Care Teams include your primary Cardiologist (physician) and Advanced Practice Providers (APPs -  Physician Assistants and Nurse Practitioners) who all work together to provide you with the care you need, when you need it. . You MAY SCHEDULE a follow up appointment AS NEEDED.  Please call  our office 2 months in advance to schedule this appointment.  You may see Dr. Gwenlyn Found or one of the following Advanced Practice Providers on your designated Care Team:   . Kerin Ransom, [place] . Almyra Deforest, PA-C . Fabian Sharp, PA-C . Jory Sims, DNP . Rosaria Ferries, PA-C . Roby Lofts, PA-C . Sande Rives, PA-C

## 2018-01-31 NOTE — Patient Instructions (Signed)
Medication Instructions:  Continue current medications If you need a refill on your cardiac medications before your next appointment, please call your pharmacy.   Follow-Up: At [facility], you and your health needs are our priority.  As part of our continuing mission to provide you with exceptional heart care, we have created designated Provider Care Teams.  These Care Teams include your primary Cardiologist (physician) and Advanced Practice Providers (APPs -  Physician Assistants and Nurse Practitioners) who all work together to provide you with the care you need, when you need it. You will need a follow up appointment in 6 months.  Please call our office 2 months in advance to schedule this appointment.  You may see Sanda Klein, MD or one of the following Advanced Practice Providers on your designated Care Team: [place], [place] . Fabian Sharp, PA-C  Any Other Special Instructions Will Be Listed Below (If Applicable).

## 2018-01-31 NOTE — Assessment & Plan Note (Signed)
History of peripheral arterial disease status post left SFA PTA and covered stenting using 2 overlapping Viabahn covered stents for a CTO in the setting of critical ischemia with improvement in his left ABI initially from 0.3 up to 0.94.  His symptoms resolved.  Recent lower extremity Dopplers performed 01/09/2018 revealed a left ABI of 1.04 with widely patent stents.  The patient remains asymptomatic.

## 2018-02-17 ENCOUNTER — Ambulatory Visit (HOSPITAL_COMMUNITY)
Admission: RE | Admit: 2018-02-17 | Discharge: 2018-02-17 | Disposition: A | Discharge status: Home / Self Care | Payer: PPO | Source: Ambulatory Visit | Attending: Cardiology | Admitting: Cardiology

## 2018-02-17 ENCOUNTER — Other Ambulatory Visit: Payer: Self-pay | Admitting: Cardiovascular Disease

## 2018-02-17 DIAGNOSIS — I6523 Occlusion and stenosis of bilateral carotid arteries: Secondary | ICD-10-CM

## 2018-02-17 DIAGNOSIS — I739 Peripheral vascular disease, unspecified: Secondary | ICD-10-CM

## 2018-02-18 ENCOUNTER — Other Ambulatory Visit: Payer: Self-pay | Admitting: *Deleted

## 2018-02-18 DIAGNOSIS — I6523 Occlusion and stenosis of bilateral carotid arteries: Secondary | ICD-10-CM

## 2018-03-14 ENCOUNTER — Other Ambulatory Visit: Payer: Self-pay | Admitting: Cardiovascular Disease

## 2018-03-19 ENCOUNTER — Other Ambulatory Visit: Payer: Self-pay | Admitting: Cardiovascular Disease

## 2018-03-19 NOTE — Telephone Encounter (Signed)
Scr = 1.67 ([facility] @ [facility]) on 09-30-2017 CrCl = 25.76ml/min

## 2018-03-31 DIAGNOSIS — I2510 Atherosclerotic heart disease of native coronary artery without angina pectoris: Secondary | ICD-10-CM | POA: Diagnosis not present

## 2018-03-31 DIAGNOSIS — E119 Type 2 diabetes mellitus without complications: Secondary | ICD-10-CM | POA: Diagnosis not present

## 2018-03-31 DIAGNOSIS — F0390 Unspecified dementia without behavioral disturbance: Secondary | ICD-10-CM | POA: Diagnosis not present

## 2018-03-31 DIAGNOSIS — E7800 Pure hypercholesterolemia, unspecified: Secondary | ICD-10-CM | POA: Diagnosis not present

## 2018-03-31 DIAGNOSIS — I739 Peripheral vascular disease, unspecified: Secondary | ICD-10-CM | POA: Diagnosis not present

## 2018-03-31 DIAGNOSIS — I480 Paroxysmal atrial fibrillation: Secondary | ICD-10-CM | POA: Diagnosis not present

## 2018-05-12 ENCOUNTER — Other Ambulatory Visit: Payer: Self-pay | Admitting: Diagnostic Neuroimaging

## 2018-06-18 DIAGNOSIS — R300 Dysuria: Secondary | ICD-10-CM | POA: Diagnosis not present

## 2018-06-18 DIAGNOSIS — N390 Urinary tract infection, site not specified: Secondary | ICD-10-CM | POA: Diagnosis not present

## 2018-06-18 DIAGNOSIS — R05 Cough: Secondary | ICD-10-CM | POA: Diagnosis not present

## 2018-07-02 DIAGNOSIS — Z139 Encounter for screening, unspecified: Secondary | ICD-10-CM | POA: Diagnosis not present

## 2018-07-02 DIAGNOSIS — E7800 Pure hypercholesterolemia, unspecified: Secondary | ICD-10-CM | POA: Diagnosis not present

## 2018-07-02 DIAGNOSIS — E119 Type 2 diabetes mellitus without complications: Secondary | ICD-10-CM | POA: Diagnosis not present

## 2018-07-02 DIAGNOSIS — I2510 Atherosclerotic heart disease of native coronary artery without angina pectoris: Secondary | ICD-10-CM | POA: Diagnosis not present

## 2018-07-02 DIAGNOSIS — Z681 Body mass index (BMI) 19 or less, adult: Secondary | ICD-10-CM | POA: Diagnosis not present

## 2018-07-02 DIAGNOSIS — Z9181 History of falling: Secondary | ICD-10-CM | POA: Diagnosis not present

## 2018-07-02 DIAGNOSIS — I739 Peripheral vascular disease, unspecified: Secondary | ICD-10-CM | POA: Diagnosis not present

## 2018-07-02 DIAGNOSIS — F0390 Unspecified dementia without behavioral disturbance: Secondary | ICD-10-CM | POA: Diagnosis not present

## 2018-07-02 DIAGNOSIS — I480 Paroxysmal atrial fibrillation: Secondary | ICD-10-CM | POA: Diagnosis not present

## 2018-07-03 ENCOUNTER — Encounter (HOSPITAL_COMMUNITY): Payer: Self-pay | Admitting: *Deleted

## 2018-07-03 ENCOUNTER — Other Ambulatory Visit: Payer: Self-pay

## 2018-07-03 ENCOUNTER — Observation Stay (HOSPITAL_COMMUNITY)
Admission: EM | Admit: 2018-07-03 | Discharge: 2018-07-04 | Disposition: A | Discharge status: Home / Self Care | Payer: PPO | Attending: Internal Medicine | Admitting: Internal Medicine

## 2018-07-03 DIAGNOSIS — Z79899 Other long term (current) drug therapy: Secondary | ICD-10-CM | POA: Insufficient documentation

## 2018-07-03 DIAGNOSIS — I480 Paroxysmal atrial fibrillation: Secondary | ICD-10-CM

## 2018-07-03 DIAGNOSIS — E782 Mixed hyperlipidemia: Secondary | ICD-10-CM | POA: Insufficient documentation

## 2018-07-03 DIAGNOSIS — E1151 Type 2 diabetes mellitus with diabetic peripheral angiopathy without gangrene: Secondary | ICD-10-CM | POA: Insufficient documentation

## 2018-07-03 DIAGNOSIS — F0390 Unspecified dementia without behavioral disturbance: Secondary | ICD-10-CM

## 2018-07-03 DIAGNOSIS — N184 Chronic kidney disease, stage 4 (severe): Secondary | ICD-10-CM

## 2018-07-03 DIAGNOSIS — Z87891 Personal history of nicotine dependence: Secondary | ICD-10-CM | POA: Diagnosis not present

## 2018-07-03 DIAGNOSIS — M545 Low back pain: Secondary | ICD-10-CM | POA: Insufficient documentation

## 2018-07-03 DIAGNOSIS — K219 Gastro-esophageal reflux disease without esophagitis: Secondary | ICD-10-CM | POA: Insufficient documentation

## 2018-07-03 DIAGNOSIS — I2510 Atherosclerotic heart disease of native coronary artery without angina pectoris: Secondary | ICD-10-CM | POA: Diagnosis not present

## 2018-07-03 DIAGNOSIS — D500 Iron deficiency anemia secondary to blood loss (chronic): Secondary | ICD-10-CM | POA: Diagnosis present

## 2018-07-03 DIAGNOSIS — E119 Type 2 diabetes mellitus without complications: Secondary | ICD-10-CM | POA: Diagnosis not present

## 2018-07-03 DIAGNOSIS — Z7902 Long term (current) use of antithrombotics/antiplatelets: Secondary | ICD-10-CM | POA: Insufficient documentation

## 2018-07-03 DIAGNOSIS — R4182 Altered mental status, unspecified: Secondary | ICD-10-CM | POA: Diagnosis not present

## 2018-07-03 DIAGNOSIS — D649 Anemia, unspecified: Principal | ICD-10-CM | POA: Diagnosis present

## 2018-07-03 DIAGNOSIS — G8929 Other chronic pain: Secondary | ICD-10-CM | POA: Diagnosis not present

## 2018-07-03 DIAGNOSIS — Z1159 Encounter for screening for other viral diseases: Secondary | ICD-10-CM | POA: Diagnosis not present

## 2018-07-03 DIAGNOSIS — M1990 Unspecified osteoarthritis, unspecified site: Secondary | ICD-10-CM | POA: Insufficient documentation

## 2018-07-03 DIAGNOSIS — E1122 Type 2 diabetes mellitus with diabetic chronic kidney disease: Secondary | ICD-10-CM | POA: Diagnosis not present

## 2018-07-03 DIAGNOSIS — I739 Peripheral vascular disease, unspecified: Secondary | ICD-10-CM | POA: Diagnosis not present

## 2018-07-03 DIAGNOSIS — I129 Hypertensive chronic kidney disease with stage 1 through stage 4 chronic kidney disease, or unspecified chronic kidney disease: Secondary | ICD-10-CM | POA: Diagnosis not present

## 2018-07-03 DIAGNOSIS — Z950 Presence of cardiac pacemaker: Secondary | ICD-10-CM | POA: Diagnosis not present

## 2018-07-03 DIAGNOSIS — Z7901 Long term (current) use of anticoagulants: Secondary | ICD-10-CM | POA: Insufficient documentation

## 2018-07-03 DIAGNOSIS — R636 Underweight: Secondary | ICD-10-CM | POA: Insufficient documentation

## 2018-07-03 DIAGNOSIS — R5383 Other fatigue: Secondary | ICD-10-CM | POA: Diagnosis not present

## 2018-07-03 DIAGNOSIS — Z8249 Family history of ischemic heart disease and other diseases of the circulatory system: Secondary | ICD-10-CM | POA: Insufficient documentation

## 2018-07-03 DIAGNOSIS — Z95810 Presence of automatic (implantable) cardiac defibrillator: Secondary | ICD-10-CM | POA: Insufficient documentation

## 2018-07-03 DIAGNOSIS — Z20828 Contact with and (suspected) exposure to other viral communicable diseases: Secondary | ICD-10-CM | POA: Diagnosis not present

## 2018-07-03 LAB — COMPREHENSIVE METABOLIC PANEL
ALT: 10 U/L (ref 0–44)
AST: 19 U/L (ref 15–41)
Albumin: 3.6 g/dL (ref 3.5–5.0)
Alkaline Phosphatase: 60 U/L (ref 38–126)
Anion gap: 9 (ref 5–15)
BUN: 27 mg/dL — ABNORMAL HIGH (ref 8–23)
CO2: 24 mmol/L (ref 22–32)
Calcium: 8.6 mg/dL — ABNORMAL LOW (ref 8.9–10.3)
Chloride: 104 mmol/L (ref 98–111)
Creatinine, Ser: 2.54 mg/dL — ABNORMAL HIGH (ref 0.61–1.24)
GFR calc Af Amer: 26 mL/min — ABNORMAL LOW (ref >60)
GFR calc non Af Amer: 23 mL/min — ABNORMAL LOW (ref >60)
Glucose, Bld: 146 mg/dL — ABNORMAL HIGH (ref 70–99)
Potassium: 3.6 mmol/L (ref 3.5–5.1)
Sodium: 137 mmol/L (ref 135–145)
Total Bilirubin: 0.7 mg/dL (ref 0.3–1.2)
Total Protein: 7 g/dL (ref 6.5–8.1)

## 2018-07-03 LAB — CBC
HCT: 25.3 % — ABNORMAL LOW (ref 39.0–52.0)
Hemoglobin: 6.6 g/dL — CL (ref 13.0–17.0)
MCH: 17.1 pg — ABNORMAL LOW (ref 26.0–34.0)
MCHC: 26.1 g/dL — ABNORMAL LOW (ref 30.0–36.0)
MCV: 65.7 fL — ABNORMAL LOW (ref 80.0–100.0)
Platelets: 568 10*3/uL — ABNORMAL HIGH (ref 150–400)
RBC: 3.85 MIL/uL — ABNORMAL LOW (ref 4.22–5.81)
RDW: 18.6 % — ABNORMAL HIGH (ref 11.5–15.5)
WBC: 10.7 10*3/uL — ABNORMAL HIGH (ref 4.0–10.5)
nRBC: 0 % (ref 0.0–0.2)

## 2018-07-03 LAB — POC OCCULT BLOOD, ED: Fecal Occult Bld: NEGATIVE

## 2018-07-03 LAB — SARS CORONAVIRUS 2 BY RT PCR (HOSPITAL ORDER, PERFORMED IN ~~LOC~~ HOSPITAL LAB): SARS Coronavirus 2: NEGATIVE

## 2018-07-03 LAB — PREPARE RBC (CROSSMATCH)

## 2018-07-03 MED ORDER — ACETAMINOPHEN 325 MG PO TABS: 650.0000 mg | ORAL_TABLET | Freq: Four times a day (QID) | ORAL | Status: DC | PRN

## 2018-07-03 MED ORDER — ONDANSETRON HCL 4 MG/2ML IJ SOLN: 4.0000 mg | Freq: Four times a day (QID) | INTRAMUSCULAR | Status: DC | PRN

## 2018-07-03 MED ORDER — SENNOSIDES-DOCUSATE SODIUM 8.6-50 MG PO TABS: 1.0000 | ORAL_TABLET | Freq: Every evening | ORAL | Status: DC | PRN

## 2018-07-03 MED ORDER — RIVAROXABAN 15 MG PO TABS: 15.0000 mg | ORAL_TABLET | Freq: Every day | ORAL | Status: DC

## 2018-07-03 MED ORDER — SODIUM CHLORIDE 0.9 % IV BOLUS
500.0000 mL | Freq: Once | INTRAVENOUS | Status: AC
  Administered 2018-07-04: 500 mL via INTRAVENOUS

## 2018-07-03 MED ORDER — DONEPEZIL HCL 23 MG PO TABS
23.0000 mg | ORAL_TABLET | Freq: Every day | ORAL | Status: DC
  Administered 2018-07-04: 23 mg via ORAL
  Filled 2018-07-03: qty 1

## 2018-07-03 MED ORDER — CLOPIDOGREL BISULFATE 75 MG PO TABS
75.0000 mg | ORAL_TABLET | Freq: Every day | ORAL | Status: DC
  Administered 2018-07-04: 75 mg via ORAL
  Filled 2018-07-03: qty 1

## 2018-07-03 MED ORDER — ONDANSETRON HCL 4 MG PO TABS: 4.0000 mg | ORAL_TABLET | Freq: Four times a day (QID) | ORAL | Status: DC | PRN

## 2018-07-03 MED ORDER — MEMANTINE HCL 10 MG PO TABS
10.0000 mg | ORAL_TABLET | Freq: Two times a day (BID) | ORAL | Status: DC
  Administered 2018-07-04 (×2): 10 mg via ORAL
  Filled 2018-07-03 (×3): qty 1

## 2018-07-03 MED ORDER — SODIUM CHLORIDE 0.9% FLUSH: 3.0000 mL | INTRAVENOUS | Status: DC | PRN

## 2018-07-03 MED ORDER — SODIUM CHLORIDE 0.9 % IV SOLN: 250.0000 mL | INTRAVENOUS | Status: DC | PRN

## 2018-07-03 MED ORDER — SODIUM CHLORIDE 0.9% FLUSH
3.0000 mL | Freq: Two times a day (BID) | INTRAVENOUS | Status: DC
  Administered 2018-07-04: 3 mL via INTRAVENOUS

## 2018-07-03 MED ORDER — SODIUM CHLORIDE 0.9 % IV SOLN: 10.0000 mL/h | Freq: Once | INTRAVENOUS | Status: DC

## 2018-07-03 MED ORDER — ACETAMINOPHEN 650 MG RE SUPP: 650.0000 mg | Freq: Four times a day (QID) | RECTAL | Status: DC | PRN

## 2018-07-03 MED ORDER — SODIUM CHLORIDE 0.9% FLUSH: 3.0000 mL | Freq: Two times a day (BID) | INTRAVENOUS | Status: DC

## 2018-07-03 MED ORDER — PANTOPRAZOLE SODIUM 40 MG PO TBEC
40.0000 mg | DELAYED_RELEASE_TABLET | Freq: Every day | ORAL | Status: DC
  Administered 2018-07-04 (×2): 40 mg via ORAL
  Filled 2018-07-03 (×2): qty 1

## 2018-07-03 MED ORDER — MIRTAZAPINE 7.5 MG PO TABS
7.5000 mg | ORAL_TABLET | Freq: Every day | ORAL | Status: DC
  Administered 2018-07-04: 7.5 mg via ORAL
  Filled 2018-07-03 (×2): qty 1

## 2018-07-03 NOTE — ED Provider Notes (Signed)
[facility] EMERGENCY DEPARTMENT Provider Note   CSN: [id number] Arrival date & time: 07/03/18  1935     History   Chief Complaint Chief Complaint  Patient presents with  . Fatigue    HPI Thomas Snow is a 84 y.o. male who presents with symptomatic anemia. PMH significant for dementia, CAD, PAF on Eliquis, GERD, sinus node arrest s/p AICD, carotid artery disease.  The history is obtained primarily through his wife.  The patient was scheduled for a routine 6 months checkup yesterday.  He has not been complaining of anything specific.  They called them today and told him to come to the ED because his hemoglobin had dropped from normal to 7.1.  She has not noticed any blood in the stool and he has not had any specific bleeding issues.  He is on Eliquis and Plavix for heart conditions.  The patient states that he does not know why he is here and feels well.  He reported some fatigue in triage.  He denies any pain or shortness of breath.  His wife states that she thinks he has had a colonoscopy at some point but cannot recall when it was or who did this procedure. He does not take any NSAIDs.     HPI  Past Medical History:  Diagnosis Date  . AICD (automatic cardioverter/defibrillator) present   . Arthritis    "my back hurts right much" (06/04/2016)  . Carotid artery occlusion    left SCA stent '01, carotid occlusive dz  . Chronic lower back pain   . Coronary artery disease    prox and mid RCA atherectomy '01  . Dementia ([place])    "don't know what stage" (06/04/2016)  . GERD (gastroesophageal reflux disease)   . History of kidney stones   . Hyperlipidemia   . Hypertension   . Pneumonia    as a child and again in 02/2014  (06/04/2016)  . Urinary hesitancy     Patient Active Problem List   Diagnosis Date Noted  . Mixed hypertriglyceridemia 01/31/2018  . Diabetes mellitus type 2 in nonobese ([place]) 01/31/2018  . Stenosis of both subclavian arteries 01/31/2018  .  Critical lower limb ischemia 06/04/2016  . CKD (chronic kidney disease) stage 4, GFR 15-29 ml/min (HCC)   . PVD (peripheral vascular disease) ([place]) 06/03/2016  . Claudication in peripheral vascular disease ([place]) 04/13/2016  . Chronic anticoagulation 04/13/2016  . Sepsis ([place]) 02/06/2014  . Community acquired pneumonia 02/06/2014  . Essential hypertension 02/06/2014  . Elevated fasting glucose 02/06/2014  . Mesenteric ischemia ([place]) 09/29/2013  . CAD s/p RCA angioplasty 2001 04/01/2013  . PAD (peripheral artery disease) ([place]) 04/01/2013  . Celiac artery stenosis ([place]) 04/01/2013  . Renal artery stenosis ([place]) 04/01/2013  . Paroxysmal sinus arrest with syncope 04/01/2013  . Pacemaker - dual chamber, implanted in 1985, last generator change 2006, [employer] 04/01/2013  . Carotid stenosis, bilateral s/p R CEA 2013 04/01/2013  . Underweight 04/01/2013  . Hyperlipidemia 04/01/2013  . Paroxysmal atrial fibrillation ([place]) 04/01/2013  . Acute renal failure superimposed on stage 3 chronic kidney disease ([place]) 04/01/2013  . Aftercare following surgery of the circulatory system, [place] 12/23/2012  . Carotid artery stenosis 08/20/2011    Past Surgical History:  Procedure Laterality Date  . BACK SURGERY    . CARDIAC CATHETERIZATION    . CARDIAC CATHETERIZATION  03/09/1999   RCA high-grade 95% stenosis, inflated with a 3.25 IVT cutting balloon at 6-64, 6-41, 6-52, and 6-53,  resulting in reductiong of less than 10%. RCA proximal 75% stenosis inflated with a 2.75x10 IVT cutting balloon at 6-65 then exchanged for a 3x10 IVT cutting balloon inflated at 6-83.  Marland Kitchen CARDIAC PACEMAKER PLACEMENT     [place] '85, '95; [place] '06  . CARDIOVASCULAR STRESS TEST  08/09/2010   Normal pattern of perfusion in all regions. No ECG changes. EKG negative for ischemia.  Marland Kitchen CAROTID ENDARTERECTOMY Right 09-05-11   cea  . CATARACT EXTRACTION, BILATERAL Bilateral   . CORONARY ANGIOPLASTY    . ENDARTERECTOMY  09/05/2011    Procedure: ENDARTERECTOMY CAROTID;  Surgeon: Mal Misty, MD;  Location: [facility];  Service: Vascular;  Laterality: Right;  . ICD GENERATOR CHANGE  10/20/2004   Implantation of [facility] [place], model # 5357M/s, serial # [id number]  . kidney stent Left    left RAS '05  . KIDNEY STONE SURGERY     "between kidney and bladder; cut it then had to open him up to get it back together"  . KNEE SURGERY Right    "forgot why/what they done"  . LOWER EXTREMITY INTERVENTION N/A 06/04/2016   Procedure: Lower Extremity Intervention;  Surgeon: Lorretta Harp, MD;  Location: [facility];  Service: Cardiovascular;  Laterality: N/A;  . LUMBAR [place] SURGERY  1990s  . PERIPHERAL VASCULAR ANGIOGRAM  01/22/2003   Left subclavian 85% fairly focal in-stent restenosis, dilated with a 7x2cm Powerflex balloon at 10-30 and 10-30, resulting in less than 10% residual narrowing. Left renal artery demonstrated 80-85% initially dilated with a 4x15 mm coronary cutting balloon at 10-30 and 12-30, exchanged for a 47mmx2cm Aviator balloon inflations were done at 6-15, 10-30, and 10-30, resulting less than 10% residual.  . PERIPHERAL VASCULAR ANGIOGRAM  07/13/1999   Left subclavian 85% dilated with a 70mmx2cm Cordis Power-Flex balloon at 6-40, a P-204 iliac stent was hand-crimped on a 28mmx2cm Cordis Power-Flex balloon, expanded at 8-40 with reduction to 0%. Left Renal artery 75-80% stenosis, dilated with a 62mmx1.5cm Cordis Power-Flex balloon at 6-40, reduction of stenosis from 80% to 0%.  Marland Kitchen PERIPHERAL VASCULAR INTERVENTION  06/04/2016   Procedure: Peripheral Vascular Intervention;  Surgeon: Lorretta Harp, MD;  Location: [facility];  Service: Cardiovascular;;  SFA  . RENAL DOPPLER  09/25/2012   Celiac artery and SMA-demonstrates narrowing with increased velocities consistent with greater than 50%, right renal- 1-59% diameter reduction, left renal artery stent- 1-59% diameter reduction.  . TONSILLECTOMY    .  TRANSTHORACIC ECHOCARDIOGRAM  09-25-2012   EF 55-60%, mild-moderate tricuspid valve regurg        Home Medications    Prior to Admission medications   Medication Sig Start Date End Date Taking? Authorizing Provider  acetaminophen (TYLENOL) 325 MG tablet Take 650 mg by mouth 3 (three) times daily.    [provider]  clopidogrel (PLAVIX) 75 MG tablet Take 1 tablet by mouth once daily with breakfast 03/14/18   Croitoru, Mihai, MD  donepezil (ARICEPT) 23 MG TABS tablet 23 mg daily. 03/31/17   [provider]  memantine (NAMENDA) 10 MG tablet Take 1 tablet (10 mg total) by mouth 2 (two) times daily. 04/16/17   Penumalli, Earlean Polka, MD  mirtazapine (REMERON) 15 MG tablet Take 0.5 tablets by mouth at bedtime. 11/20/16   [provider]  XARELTO 15 MG TABS tablet TAKE 1 TABLET BY MOUTH ONCE DAILY WITH SUPPER 03/19/18   Lorretta Harp, MD    Family History Family History  Problem Relation Age of Onset  . Heart disease Mother   . Arthritis Father     Social History Social History   Tobacco Use  . Smoking status: Former Smoker    Packs/day: 0.50    Years: 25.00    Pack years: 12.50    Types: Cigarettes    Quit date: 08/19/1996    Years since quitting: 21.8  . Smokeless tobacco: Current User    Types: Snuff, Chew  Substance Use Topics  . Alcohol use: No  . Drug use: No     Allergies   Atorvastatin and Crestor [rosuvastatin]   Review of Systems Review of Systems  Constitutional: Positive for fatigue. Negative for fever.  Respiratory: Negative for shortness of breath.   Cardiovascular: Negative for chest pain.  Gastrointestinal: Negative for abdominal pain.  Hematological: Bruises/bleeds easily.  Psychiatric/Behavioral: Positive for confusion.  All other systems reviewed and are negative.    Physical Exam Updated Vital Signs BP 129/60 (BP Location: Right Arm)   Pulse 88   Temp 98 F (36.7 C) (Oral)   Resp 16   SpO2 100%   Physical Exam  Vitals signs and nursing note reviewed.  Constitutional:      General: He is not in acute distress.    Appearance: Normal appearance. He is well-developed. He is not ill-appearing.     Comments: Elderly male in NAD. Pleasantly confused. Underweight  HENT:     Head: Normocephalic and atraumatic.  Eyes:     General: No scleral icterus.       Right eye: No discharge.        Left eye: No discharge.     Conjunctiva/sclera: Conjunctivae normal.     Pupils: Pupils are equal, round, and reactive to light.     Comments: Pale conjunctiva  Neck:     Musculoskeletal: Normal range of motion.  Cardiovascular:     Rate and Rhythm: Normal rate and regular rhythm.  Pulmonary:     Effort: Pulmonary effort is normal. No respiratory distress.     Breath sounds: Normal breath sounds.  Abdominal:     General: There is no distension.     Palpations: Abdomen is soft.     Tenderness: There is no abdominal tenderness.  Genitourinary:    Comments: Rectal: No gross blood, hemorrhoids, fissures, redness, area of fluctuance, lesions, or tenderness. Chaperone present during exam.  Skin:    General: Skin is warm and dry.  Neurological:     Mental Status: He is alert and oriented to person, place, and time.  Psychiatric:        Behavior: Behavior normal.      ED Treatments / Results  Labs (all labs ordered are listed, but only abnormal results are displayed) Labs Reviewed  COMPREHENSIVE METABOLIC PANEL - Abnormal; Notable for the following components:      Result Value   Glucose, Bld 146 (*)    BUN 27 (*)    Creatinine, Ser 2.54 (*)    Calcium 8.6 (*)    GFR calc non Af Amer 23 (*)    GFR calc Af Amer 26 (*)    All other components within normal limits  CBC - Abnormal; Notable for the following components:   WBC 10.7 (*)    RBC 3.85 (*)    Hemoglobin 6.6 (*)    HCT 25.3 (*)    MCV 65.7 (*)    MCH 17.1 (*)    MCHC 26.1 (*)    RDW 18.6 (*)  Platelets 568 (*)    All other components  within normal limits  SARS CORONAVIRUS 2 (HOSPITAL ORDER, [facility])  POC OCCULT BLOOD, ED  TYPE AND SCREEN  PREPARE RBC (CROSSMATCH)    EKG None  Radiology No results found.  Procedures Procedures (including critical care time)  CRITICAL CARE Performed by: Recardo Evangelist   Total critical care time: 30 minutes  Critical care time was exclusive of separately billable procedures and treating other patients.  Critical care was necessary to treat or prevent imminent or life-threatening deterioration.  Critical care was time spent personally by me on the following activities: development of treatment plan with patient and/or surrogate as well as nursing, discussions with consultants, evaluation of patient's response to treatment, examination of patient, obtaining history from patient or surrogate, ordering and performing treatments and interventions, ordering and review of laboratory studies, ordering and review of radiographic studies, pulse oximetry and re-evaluation of patient's condition.   Medications Ordered in ED Medications  0.9 %  sodium chloride infusion (has no administration in time range)  sodium chloride 0.9 % bolus 500 mL (has no administration in time range)     Initial Impression / Assessment and Plan / ED Course  I have reviewed the triage vital signs and the nursing notes.  Pertinent labs & imaging results that were available during my care of the patient were reviewed by me and considered in my medical decision making (see chart for details).  84 year old male presents with low hgb - sent by PCP. He is hemodynamically stable and essentially has no complaints. Repeat CBC here shows hgb is 6.6. Will order 1 unit of blood. Abdomen is soft and non-tender. Rectal exam is negative for gross blood and hemoccult is negative. He is on Plavis and Eliquis. Shared visit with Dr. Rex Kras. Spoke with Dr. Jeronimo Greaves who will admit.  Final Clinical  Impressions(s) / ED Diagnoses   Final diagnoses:  Symptomatic anemia    ED Discharge Orders    None       Recardo Evangelist, PA-C 07/07/18 Archer, Wenda Overland, MD 07/07/18 [phone]

## 2018-07-03 NOTE — H&P (Signed)
History and Physical    Thomas Snow TFT:[id number] DOB: 02-15-1934 DOA: 07/03/2018  PCP: Cyndi Bender, PA-C   Patient coming from: Home   Chief Complaint: Fatigue, low Hgb    HPI: TANK DIFIORE is a 84 y.o. male with medical history significant for diet-controlled diabetes mellitus, coronary artery disease, peripheral arterial disease, paroxysmal atrial fibrillation on Xarelto, and dementia, now presenting to the emergency department for evaluation of low hemoglobin.  Patient is accompanied by his wife who assists with the history.  Patient has no complaints and specifically denies any chest pain, abdominal pain, melena, or hematochezia.  His wife has noted progressive fatigue in the patient, occurring over the past year or so.  There has not been any recent acute change in his condition.  He takes Xarelto and Plavix, has not been taking NSAIDs, and has not had any change in his bowel habits.  He was seen in an outpatient clinic, blood work was performed, and his wife was notified of low hemoglobin with instructions to present to the ED.  ED Course: Upon arrival to the ED, patient is found to be afebrile, saturating well on room air, and remaining vitals also normal.  Chemistry panel features a creatinine of 2.54, up from 1.6 two years ago.  CBC is notable for hemoglobin of 6.6, platelets 568,000, and MCV 65.7.  Patient was given 500 cc normal saline, 1 unit of packed red blood cells was ordered and is now infusing, and hospitalists are asked to admit.  Review of Systems:  All other systems reviewed and apart from HPI, are negative.  Past Medical History:  Diagnosis Date  . AICD (automatic cardioverter/defibrillator) present   . Arthritis    "my back hurts right much" (06/04/2016)  . Carotid artery occlusion    left SCA stent '01, carotid occlusive dz  . Chronic lower back pain   . Coronary artery disease    prox and mid RCA atherectomy '01  . Dementia ([place])    "don't know what  stage" (06/04/2016)  . GERD (gastroesophageal reflux disease)   . History of kidney stones   . Hyperlipidemia   . Hypertension   . Pneumonia    as a child and again in 02/2014  (06/04/2016)  . Urinary hesitancy     Past Surgical History:  Procedure Laterality Date  . BACK SURGERY    . CARDIAC CATHETERIZATION    . CARDIAC CATHETERIZATION  03/09/1999   RCA high-grade 95% stenosis, inflated with a 3.25 IVT cutting balloon at 6-64, 6-41, 6-52, and 6-53, resulting in reductiong of less than 10%. RCA proximal 75% stenosis inflated with a 2.75x10 IVT cutting balloon at 6-65 then exchanged for a 3x10 IVT cutting balloon inflated at 6-83.  Marland Kitchen CARDIAC PACEMAKER PLACEMENT     [place] '85, '95; [place] '06  . CARDIOVASCULAR STRESS TEST  08/09/2010   Normal pattern of perfusion in all regions. No ECG changes. EKG negative for ischemia.  Marland Kitchen CAROTID ENDARTERECTOMY Right 09-05-11   cea  . CATARACT EXTRACTION, BILATERAL Bilateral   . CORONARY ANGIOPLASTY    . ENDARTERECTOMY  09/05/2011   Procedure: ENDARTERECTOMY CAROTID;  Surgeon: Mal Misty, MD;  Location: [facility];  Service: Vascular;  Laterality: Right;  . ICD GENERATOR CHANGE  10/20/2004   Implantation of [facility], model # 5357M/s, serial # [id number]  . kidney stent Left    left RAS '05  . KIDNEY STONE SURGERY     "between kidney  and bladder; cut it then had to open him up to get it back together"  . KNEE SURGERY Right    "forgot why/what they done"  . LOWER EXTREMITY INTERVENTION N/A 06/04/2016   Procedure: Lower Extremity Intervention;  Surgeon: Lorretta Harp, MD;  Location: [place] [facility];  Service: Cardiovascular;  Laterality: N/A;  . LUMBAR [place] SURGERY  1990s  . PERIPHERAL VASCULAR ANGIOGRAM  01/22/2003   Left subclavian 85% fairly focal in-stent restenosis, dilated with a 7x2cm Powerflex balloon at 10-30 and 10-30, resulting in less than 10% residual narrowing. Left renal artery demonstrated 80-85% initially  dilated with a 4x15 mm coronary cutting balloon at 10-30 and 12-30, exchanged for a 41mmx2cm Aviator balloon inflations were done at 6-15, 10-30, and 10-30, resulting less than 10% residual.  . PERIPHERAL VASCULAR ANGIOGRAM  07/13/1999   Left subclavian 85% dilated with a 14mmx2cm Cordis Power-Flex balloon at 6-40, a P-204 iliac stent was hand-crimped on a 53mmx2cm Cordis Power-Flex balloon, expanded at 8-40 with reduction to 0%. Left Renal artery 75-80% stenosis, dilated with a 58mmx1.5cm Cordis Power-Flex balloon at 6-40, reduction of stenosis from 80% to 0%.  Marland Kitchen PERIPHERAL VASCULAR INTERVENTION  06/04/2016   Procedure: Peripheral Vascular Intervention;  Surgeon: Lorretta Harp, MD;  Location: [place] [facility];  Service: Cardiovascular;;  SFA  . RENAL DOPPLER  09/25/2012   Celiac artery and SMA-demonstrates narrowing with increased velocities consistent with greater than 50%, right renal- 1-59% diameter reduction, left renal artery stent- 1-59% diameter reduction.  . TONSILLECTOMY    . TRANSTHORACIC ECHOCARDIOGRAM  09-25-2012   EF 55-60%, mild-moderate tricuspid valve regurg     reports that he quit smoking about 21 years ago. His smoking use included cigarettes. He has a 12.50 pack-year smoking history. His smokeless tobacco use includes snuff and chew. He reports that he does not drink alcohol or use drugs.  Allergies  Allergen Reactions  . Atorvastatin     Leg pain  . Crestor [Rosuvastatin]     Leg pain    Family History  Problem Relation Age of Onset  . Heart disease Mother   . Arthritis Father      Prior to Admission medications   Medication Sig Start Date End Date Taking? Authorizing Provider  acetaminophen (TYLENOL) 325 MG tablet Take 650 mg by mouth 3 (three) times daily.    [provider]  clopidogrel (PLAVIX) 75 MG tablet Take 1 tablet by mouth once daily with breakfast 03/14/18   Croitoru, Mihai, MD  donepezil (ARICEPT) 23 MG TABS tablet 23 mg daily. 03/31/17    [provider]  memantine (NAMENDA) 10 MG tablet Take 1 tablet (10 mg total) by mouth 2 (two) times daily. 04/16/17   Penumalli, Earlean Polka, MD  mirtazapine (REMERON) 15 MG tablet Take 0.5 tablets by mouth at bedtime. 11/20/16   [provider]  XARELTO 15 MG TABS tablet TAKE 1 TABLET BY MOUTH ONCE DAILY WITH SUPPER 03/19/18   Lorretta Harp, MD    Physical Exam: Vitals:   07/03/18 2200 07/03/18 2243 07/03/18 2244 07/03/18 2306  BP: (!) 163/36 (!) 143/55 (!) 143/55 (!) 144/51  Pulse: (!) 101 87 89 96  Resp:  19 (!) 24 (!) 24  Temp:   98.4 F (36.9 C) 98.4 F (36.9 C)  TempSrc:   Oral Oral  SpO2: (!) 87% 100% 100% 100%    Constitutional: NAD, calm  Eyes: PERTLA, lids and conjunctivae normal ENMT: Mucous membranes are moist. Posterior pharynx clear  of any exudate or lesions.   Neck: normal, supple, no masses, no thyromegaly Respiratory:  no wheezing, no crackles. Normal respiratory effort. No accessory muscle use.  Cardiovascular: S1 & S2 heard, regular rate and rhythm. No extremity edema.   Abdomen: No distension, no tenderness, soft. Bowel sounds active.  Musculoskeletal: no clubbing / cyanosis. No joint deformity upper and lower extremities.   Skin: no significant rashes, lesions, ulcers. Warm, dry, well-perfused. Neurologic: CN 2-12 grossly intact. Sensation intact. Strength 5/5 in all 4 limbs.  Psychiatric: Alert and oriented to person and place only. Calm, cooperative.    Labs on Admission: I have personally reviewed following labs and imaging studies  CBC: Recent Labs  Lab 07/03/18 2038  WBC 10.7*  HGB 6.6*  HCT 25.3*  MCV 65.7*  PLT 425*   Basic Metabolic Panel: Recent Labs  Lab 07/03/18 2038  NA 137  K 3.6  CL 104  CO2 24  GLUCOSE 146*  BUN 27*  CREATININE 2.54*  CALCIUM 8.6*   GFR: CrCl cannot be calculated (Unknown ideal weight.). Liver Function Tests: Recent Labs  Lab 07/03/18 2038  AST 19  ALT 10  ALKPHOS 60  BILITOT 0.7   PROT 7.0  ALBUMIN 3.6   No results for input(s): LIPASE, AMYLASE in the last 168 hours. No results for input(s): AMMONIA in the last 168 hours. Coagulation Profile: No results for input(s): INR, PROTIME in the last 168 hours. Cardiac Enzymes: No results for input(s): CKTOTAL, CKMB, CKMBINDEX, TROPONINI in the last 168 hours. BNP (last 3 results) No results for input(s): PROBNP in the last 8760 hours. HbA1C: No results for input(s): HGBA1C in the last 72 hours. CBG: No results for input(s): GLUCAP in the last 168 hours. Lipid Profile: No results for input(s): CHOL, HDL, LDLCALC, TRIG, CHOLHDL, LDLDIRECT in the last 72 hours. Thyroid Function Tests: No results for input(s): TSH, T4TOTAL, FREET4, T3FREE, THYROIDAB in the last 72 hours. Anemia Panel: No results for input(s): VITAMINB12, FOLATE, FERRITIN, TIBC, IRON, RETICCTPCT in the last 72 hours. Urine analysis:    Component Value Date/Time   COLORURINE YELLOW 06/04/2016 2051   APPEARANCEUR CLEAR 06/04/2016 2051   LABSPEC 1.033 (H) 06/04/2016 2051   PHURINE 7.0 06/04/2016 2051   GLUCOSEU NEGATIVE 06/04/2016 2051   HGBUR LARGE (A) 06/04/2016 2051   BILIRUBINUR NEGATIVE 06/04/2016 2051   [place] NEGATIVE 06/04/2016 2051   PROTEINUR NEGATIVE 06/04/2016 2051   UROBILINOGEN 0.2 02/06/2014 1258   NITRITE NEGATIVE 06/04/2016 2051   LEUKOCYTESUR NEGATIVE 06/04/2016 2051   Sepsis Labs: @LABRCNTIP (procalcitonin:4,lacticidven:4) ) Recent Results (from the past 240 hour(s))  SARS Coronavirus 2 (CEPHEID - Performed in [place] hospital lab), Hosp Order     Status: None   Collection Time: 07/03/18 10:05 PM   Specimen: Nasopharyngeal Swab  Result Value Ref Range Status   SARS Coronavirus 2 NEGATIVE NEGATIVE Final    Comment: (NOTE) If result is NEGATIVE SARS-CoV-2 target nucleic acids are NOT DETECTED. The SARS-CoV-2 RNA is generally detectable in upper and lower  respiratory specimens during the acute phase of infection. The  lowest  concentration of SARS-CoV-2 viral copies this assay can detect is 250  copies / mL. A negative result does not preclude SARS-CoV-2 infection  and should not be used as the sole basis for treatment or other  patient management decisions.  A negative result may occur with  improper specimen collection / handling, submission of specimen other  than nasopharyngeal swab, presence of viral mutation(s) within the  areas targeted  by this assay, and inadequate number of viral copies  (<250 copies / mL). A negative result must be combined with clinical  observations, patient history, and epidemiological information. If result is POSITIVE SARS-CoV-2 target nucleic acids are DETECTED. The SARS-CoV-2 RNA is generally detectable in upper and lower  respiratory specimens dur ing the acute phase of infection.  Positive  results are indicative of active infection with SARS-CoV-2.  Clinical  correlation with patient history and other diagnostic information is  necessary to determine patient infection status.  Positive results do  not rule out bacterial infection or co-infection with other viruses. If result is PRESUMPTIVE POSTIVE SARS-CoV-2 nucleic acids MAY BE PRESENT.   A presumptive positive result was obtained on the submitted specimen  and confirmed on repeat testing.  While 2019 novel coronavirus  (SARS-CoV-2) nucleic acids may be present in the submitted sample  additional confirmatory testing may be necessary for epidemiological  and / or clinical management purposes  to differentiate between  SARS-CoV-2 and other Sarbecovirus currently known to infect humans.  If clinically indicated additional testing with an alternate test  methodology [phone]) is advised. The SARS-CoV-2 RNA is generally  detectable in upper and lower respiratory sp ecimens during the acute  phase of infection. The expected result is Negative. Fact Sheet for Patients:  StrictlyIdeas.no  Fact Sheet for Healthcare Providers: BankingDealers.co.za This test is not yet approved or cleared by the [place] FDA and has been authorized for detection and/or diagnosis of SARS-CoV-2 by FDA under an Emergency Use Authorization (EUA).  This EUA will remain in effect (meaning this test can be used) for the duration of the COVID-19 declaration under Section 564(b)(1) of the Act, 21 U.S.C. section 360bbb-3(b)(1), unless the authorization is terminated or revoked sooner. Performed at [facility], [place] [address] [place], [place] [address]      Radiological Exams on Admission: No results found.  EKG: Not performed.   Assessment/Plan   1. Symptomatic anemia  - Presents for evaluation of low Hgb on outpatient blood work, found to have Hgb of 6.6 with MCV 66 (Hgb 13.1 and MCV normal two years ago) - Patient has no complaints but his wife has noted progressive fatigue over the past year  - There has not been any melena or hematochezia, no upper GI sxs, and FOBT is negative in ED   - 1 unit of pRBC was ordered from ED and is transfusing now, will see if lab can add anemia panel to pre-transfusion draw, repeat CBC after transfusion, start daily PPI for suspected slow/chronic GI blood loss, consider iron supplementation pending iron studies   2. Chronic kidney disease stage III-IV  - Baseline is not clear; SCr is 2.54 on admission, up from 1.6 in 2018  - He has developed progressive fatigue over a year per report of spouse with no recent acute illness, and this may represent progression in his CKD  - He was given 500 cc bolus in ED and receiving 1 unit RBC  - Renally-dose medications, repeat chem panel in am   3. Paroxysmal atrial fibrillation  - In a regular rhythm on admission  - CHADS-VASc at least 35 (age x2, CAD, DM) - He is anemic, but has likely developed over several months with no acute bleeding, and stroke-prevention benefit of continued  anticoagulation is felt to outweigh risk of acute life-threatening bleed  - Continue Xarelto   4. CAD, PAD  - No angina, feet warm and pink  - He has  statin intolerance  - Continue Plavix    5. Dementia  - Continue Aricept and Namenda    6. Type II DM  - Diet-controlled, will check CBG and use a low-intensity SSI as needed    PPE: Mask, face shield. Patient wearing mask.  DVT prophylaxis: Xarelto  Code Status: Full, confirmed with patient and wife  Family Communication: Wife updated at bedside   Consults called: None  Admission status: Observation     Vianne Bulls, MD [facility] Pager [phone]  If 7PM-7AM, please contact night-coverage [email or web address] Password [email or web address]  07/03/2018, 11:18 PM

## 2018-07-03 NOTE — ED Triage Notes (Addendum)
Pt wife states the pt had a visit with his provider for a regular checkup, called today and said HGB was 7.1. Reports fatigue. Denies dizziness, SOB, CP or blood in stool. Wife says he is taking plavix at night AND xarelto in the morning.

## 2018-07-03 NOTE — ED Notes (Signed)
ED TO INPATIENT HANDOFF REPORT  ED Nurse Name and Phone #:  [phone]  S Name/Age/Gender Thomas Snow 84 y.o. male Room/Bed: 034C/034C  Code Status   Code Status: Prior  Home/SNF/Other Home Patient oriented to: self Is this baseline? Yes   Triage Complete: Triage complete  Chief Complaint needs blood transfusion dementia pt  Triage Note Pt wife states the pt had a visit with his provider for a regular checkup, called today and said HGB was 7.1. Reports fatigue. Denies dizziness, SOB, CP or blood in stool. Wife says he is taking plavix at night AND xarelto in the morning.     Allergies Allergies  Allergen Reactions  . Atorvastatin     Leg pain  . Crestor [Rosuvastatin]     Leg pain    Level of Care/Admitting Diagnosis ED Disposition    ED Disposition Condition [facility] Area: [place] [[id number]]  Level of Care: Telemetry Medical [104]  I expect the patient will be discharged within 24 hours: Yes  LOW acuity---Tx typically complete <24 hrs---ACUTE conditions typically can be evaluated <24 hours---LABS likely to return to acceptable levels <24 hours---IS near functional baseline---EXPECTED to return to current living arrangement---NOT newly hypoxic: Does not meet criteria for 5C-Observation unit  Covid Evaluation: Asymptomatic Screening Protocol (No Symptoms)  Diagnosis: Symptomatic anemia [0962836]  Admitting Physician: Vianne Bulls [[id number]]  Attending Physician: Vianne Bulls [[id number]]  PT Class (Do Not Modify): Observation [104]  PT Acc Code (Do Not Modify): Observation [10022]       B Medical/Surgery History Past Medical History:  Diagnosis Date  . AICD (automatic cardioverter/defibrillator) present   . Arthritis    "my back hurts right much" (06/04/2016)  . Carotid artery occlusion    left SCA stent '01, carotid occlusive dz  . Chronic lower back pain   . Coronary artery disease    prox and mid RCA atherectomy  '01  . Dementia ([place])    "don't know what stage" (06/04/2016)  . GERD (gastroesophageal reflux disease)   . History of kidney stones   . Hyperlipidemia   . Hypertension   . Pneumonia    as a child and again in 02/2014  (06/04/2016)  . Urinary hesitancy    Past Surgical History:  Procedure Laterality Date  . BACK SURGERY    . CARDIAC CATHETERIZATION    . CARDIAC CATHETERIZATION  03/09/1999   RCA high-grade 95% stenosis, inflated with a 3.25 IVT cutting balloon at 6-64, 6-41, 6-52, and 6-53, resulting in reductiong of less than 10%. RCA proximal 75% stenosis inflated with a 2.75x10 IVT cutting balloon at 6-65 then exchanged for a 3x10 IVT cutting balloon inflated at 6-83.  Marland Kitchen CARDIAC PACEMAKER PLACEMENT     [place] '85, '95; [place] '06  . CARDIOVASCULAR STRESS TEST  08/09/2010   Normal pattern of perfusion in all regions. No ECG changes. EKG negative for ischemia.  Marland Kitchen CAROTID ENDARTERECTOMY Right 09-05-11   cea  . CATARACT EXTRACTION, BILATERAL Bilateral   . CORONARY ANGIOPLASTY    . ENDARTERECTOMY  09/05/2011   Procedure: ENDARTERECTOMY CAROTID;  Surgeon: Mal Misty, MD;  Location: [facility] [place];  Service: Vascular;  Laterality: Right;  . ICD GENERATOR CHANGE  10/20/2004   Implantation of [facility], model # 5357M/s, serial # [id number]  . kidney stent Left    left RAS '05  . KIDNEY STONE SURGERY     "between kidney and bladder; cut  it then had to open him up to get it back together"  . KNEE SURGERY Right    "forgot why/what they done"  . LOWER EXTREMITY INTERVENTION N/A 06/04/2016   Procedure: Lower Extremity Intervention;  Surgeon: Lorretta Harp, MD;  Location: [place] [facility];  Service: Cardiovascular;  Laterality: N/A;  . LUMBAR [place] SURGERY  1990s  . PERIPHERAL VASCULAR ANGIOGRAM  01/22/2003   Left subclavian 85% fairly focal in-stent restenosis, dilated with a 7x2cm Powerflex balloon at 10-30 and 10-30, resulting in less than 10% residual narrowing. Left renal  artery demonstrated 80-85% initially dilated with a 4x15 mm coronary cutting balloon at 10-30 and 12-30, exchanged for a 35mmx2cm Aviator balloon inflations were done at 6-15, 10-30, and 10-30, resulting less than 10% residual.  . PERIPHERAL VASCULAR ANGIOGRAM  07/13/1999   Left subclavian 85% dilated with a 54mmx2cm Cordis Power-Flex balloon at 6-40, a P-204 iliac stent was hand-crimped on a 17mmx2cm Cordis Power-Flex balloon, expanded at 8-40 with reduction to 0%. Left Renal artery 75-80% stenosis, dilated with a 31mmx1.5cm Cordis Power-Flex balloon at 6-40, reduction of stenosis from 80% to 0%.  Marland Kitchen PERIPHERAL VASCULAR INTERVENTION  06/04/2016   Procedure: Peripheral Vascular Intervention;  Surgeon: Lorretta Harp, MD;  Location: [facility];  Service: Cardiovascular;;  SFA  . RENAL DOPPLER  09/25/2012   Celiac artery and SMA-demonstrates narrowing with increased velocities consistent with greater than 50%, right renal- 1-59% diameter reduction, left renal artery stent- 1-59% diameter reduction.  . TONSILLECTOMY    . TRANSTHORACIC ECHOCARDIOGRAM  09-25-2012   EF 55-60%, mild-moderate tricuspid valve regurg     A IV Location/Drains/Wounds Patient Lines/Drains/Airways Status   Active Line/Drains/Airways    Name:   Placement date:   Placement time:   Site:   Days:   Peripheral IV 07/03/18 Right Antecubital   07/03/18    2245    Antecubital   less than 1   Incision 09/05/11 Neck Right   09/05/11    1018     2493          Intake/Output Last 24 hours  Intake/Output Summary (Last 24 hours) at 07/03/2018 2315 Last data filed at 07/03/2018 2306 Gross per 24 hour  Intake 815 ml  Output -  Net 815 ml    Labs/Imaging Results for orders placed or performed during the hospital encounter of 07/03/18 (from the past 48 hour(s))  Comprehensive metabolic panel     Status: Abnormal   Collection Time: 07/03/18  8:38 PM  Result Value Ref Range   Sodium 137 135 - 145 mmol/L   Potassium 3.6 3.5 - 5.1  mmol/L   Chloride 104 98 - 111 mmol/L   CO2 24 22 - 32 mmol/L   Glucose, Bld 146 (H) 70 - 99 mg/dL   BUN 27 (H) 8 - 23 mg/dL   Creatinine, Ser 2.54 (H) 0.61 - 1.24 mg/dL   Calcium 8.6 (L) 8.9 - 10.3 mg/dL   Total Protein 7.0 6.5 - 8.1 g/dL   Albumin 3.6 3.5 - 5.0 g/dL   AST 19 15 - 41 U/L   ALT 10 0 - 44 U/L   Alkaline Phosphatase 60 38 - 126 U/L   Total Bilirubin 0.7 0.3 - 1.2 mg/dL   GFR calc non Af Amer 23 (L) >60 mL/min   GFR calc Af Amer 26 (L) >60 mL/min   Anion gap 9 5 - 15    Comment: Performed at [facility], [address], [place], [place] [address]  CBC     Status: Abnormal   Collection Time: 07/03/18  8:38 PM  Result Value Ref Range   WBC 10.7 (H) 4.0 - 10.5 K/uL   RBC 3.85 (L) 4.22 - 5.81 MIL/uL   Hemoglobin 6.6 (LL) 13.0 - 17.0 g/dL    Comment: Reticulocyte Hemoglobin testing may be clinically indicated, consider ordering this additional test [id number] THIS CRITICAL RESULT HAS VERIFIED AND BEEN CALLED TO J GORSER,RN BY WALTER BOND ON 07 02 2020 AT 2118, AND HAS BEEN READ BACK.  REPEATED TO VERIFY CORRECTED ON 07/02 AT 2202: PREVIOUSLY REPORTED AS 6.6 Reticulocyte Hemoglobin testing may be clinically indicated, consider ordering this additional test [id number] THIS CRITICAL RESULT HAS VERIFIED AND BEEN CALLED TO J GORSER,RN BY WALTER BOND ON 07 02  2020 AT 2118, AND HAS BEEN READ BACK.     HCT 25.3 (L) 39.0 - 52.0 %   MCV 65.7 (L) 80.0 - 100.0 fL   MCH 17.1 (L) 26.0 - 34.0 pg   MCHC 26.1 (L) 30.0 - 36.0 g/dL   RDW 18.6 (H) 11.5 - 15.5 %   Platelets 568 (H) 150 - 400 K/uL   nRBC 0.0 0.0 - 0.2 %    Comment: Performed at [facility] [address], [place], [place] [address]  Type and screen [place]     Status: None (Preliminary result)   Collection Time: 07/03/18  8:39 PM  Result Value Ref Range   ABO/RH(D) B POS    Antibody Screen NEG    Sample Expiration 07/06/2018,2359    Unit Number [id number]    Blood Component Type  RED CELLS,LR    Unit division 00    Status of Unit ISSUED    Transfusion Status OK TO TRANSFUSE    Crossmatch Result      Compatible Performed at [facility], [place] [address] [place], [place] [address]   Prepare RBC     Status: None   Collection Time: 07/03/18  9:55 PM  Result Value Ref Range   Order Confirmation      ORDER PROCESSED BY BLOOD BANK Performed at [facility], [place] [address] [place], [place] [address]   POC occult blood, ED     Status: None   Collection Time: 07/03/18 10:03 PM  Result Value Ref Range   Fecal Occult Bld NEGATIVE NEGATIVE   No results found.  Pending Labs Unresulted Labs (From admission, onward)    Start     Ordered   07/03/18 2157  SARS Coronavirus 2 (CEPHEID - Performed in [place] hospital lab), Hosp Order  (Asymptomatic Patients Labs)  Once,   STAT    Question:  Rule Out  Answer:  Yes   07/03/18 2156          Vitals/Pain Today's Vitals   07/03/18 2200 07/03/18 2243 07/03/18 2244 07/03/18 2306  BP: (!) 163/36 (!) 143/55 (!) 143/55 (!) 144/51  Pulse: (!) 101 87 89 96  Resp:  19 (!) 24 (!) 24  Temp:   98.4 F (36.9 C) 98.4 F (36.9 C)  TempSrc:   Oral Oral  SpO2: (!) 87% 100% 100% 100%  PainSc:        Isolation Precautions No active isolations  Medications Medications  0.9 %  sodium chloride infusion (has no administration in time range)  sodium chloride 0.9 % bolus 500 mL (has no administration in time range)    Mobility walks Moderate  fall risk   Focused Assessments Cardiac Assessment Handoff:  Cardiac Rhythm: Normal sinus rhythm No results found for: CKTOTAL, CKMB, CKMBINDEX, TROPONINI No results found for: DDIMER Does the Patient currently have chest pain? No      R Recommendations: See Admitting Provider Note  Report given to:   Additional Notes:

## 2018-07-04 DIAGNOSIS — I2510 Atherosclerotic heart disease of native coronary artery without angina pectoris: Secondary | ICD-10-CM | POA: Diagnosis not present

## 2018-07-04 DIAGNOSIS — N184 Chronic kidney disease, stage 4 (severe): Secondary | ICD-10-CM | POA: Diagnosis not present

## 2018-07-04 DIAGNOSIS — F0390 Unspecified dementia without behavioral disturbance: Secondary | ICD-10-CM | POA: Diagnosis not present

## 2018-07-04 DIAGNOSIS — E119 Type 2 diabetes mellitus without complications: Secondary | ICD-10-CM | POA: Diagnosis not present

## 2018-07-04 DIAGNOSIS — D500 Iron deficiency anemia secondary to blood loss (chronic): Secondary | ICD-10-CM | POA: Diagnosis present

## 2018-07-04 DIAGNOSIS — I739 Peripheral vascular disease, unspecified: Secondary | ICD-10-CM | POA: Diagnosis not present

## 2018-07-04 DIAGNOSIS — D649 Anemia, unspecified: Secondary | ICD-10-CM | POA: Diagnosis not present

## 2018-07-04 DIAGNOSIS — I480 Paroxysmal atrial fibrillation: Secondary | ICD-10-CM | POA: Diagnosis not present

## 2018-07-04 LAB — TYPE AND SCREEN
ABO/RH(D): B POS
Antibody Screen: NEGATIVE
Unit division: 0

## 2018-07-04 LAB — COMPREHENSIVE METABOLIC PANEL
ALT: 10 U/L (ref 0–44)
AST: 16 U/L (ref 15–41)
Albumin: 3.1 g/dL — ABNORMAL LOW (ref 3.5–5.0)
Alkaline Phosphatase: 53 U/L (ref 38–126)
Anion gap: 10 (ref 5–15)
BUN: 23 mg/dL (ref 8–23)
CO2: 19 mmol/L — ABNORMAL LOW (ref 22–32)
Calcium: 8 mg/dL — ABNORMAL LOW (ref 8.9–10.3)
Chloride: 109 mmol/L (ref 98–111)
Creatinine, Ser: 2.22 mg/dL — ABNORMAL HIGH (ref 0.61–1.24)
GFR calc Af Amer: 31 mL/min — ABNORMAL LOW (ref >60)
GFR calc non Af Amer: 27 mL/min — ABNORMAL LOW (ref >60)
Glucose, Bld: 176 mg/dL — ABNORMAL HIGH (ref 70–99)
Potassium: 4.3 mmol/L (ref 3.5–5.1)
Sodium: 138 mmol/L (ref 135–145)
Total Bilirubin: 0.7 mg/dL (ref 0.3–1.2)
Total Protein: 5.7 g/dL — ABNORMAL LOW (ref 6.5–8.1)

## 2018-07-04 LAB — RETICULOCYTES
Immature Retic Fract: 22.1 % — ABNORMAL HIGH (ref 2.3–15.9)
RBC.: 4.47 MIL/uL (ref 4.22–5.81)
Retic Count, Absolute: 52.7 10*3/uL (ref 19.0–186.0)
Retic Ct Pct: 1.2 % (ref 0.4–3.1)

## 2018-07-04 LAB — CBC
HCT: 30.5 % — ABNORMAL LOW (ref 39.0–52.0)
Hemoglobin: 8.6 g/dL — ABNORMAL LOW (ref 13.0–17.0)
MCH: 19.2 pg — ABNORMAL LOW (ref 26.0–34.0)
MCHC: 28.2 g/dL — ABNORMAL LOW (ref 30.0–36.0)
MCV: 68.2 fL — ABNORMAL LOW (ref 80.0–100.0)
Platelets: 455 10*3/uL — ABNORMAL HIGH (ref 150–400)
RBC: 4.47 MIL/uL (ref 4.22–5.81)
RDW: 20.4 % — ABNORMAL HIGH (ref 11.5–15.5)
WBC: 9.5 10*3/uL (ref 4.0–10.5)
nRBC: 0 % (ref 0.0–0.2)

## 2018-07-04 LAB — IRON AND TIBC
Iron: 15 ug/dL — ABNORMAL LOW (ref 45–182)
Saturation Ratios: 4 % — ABNORMAL LOW (ref 17.9–39.5)
TIBC: 395 ug/dL (ref 250–450)
UIBC: 380 ug/dL

## 2018-07-04 LAB — GLUCOSE, CAPILLARY: Glucose-Capillary: 125 mg/dL — ABNORMAL HIGH (ref 70–99)

## 2018-07-04 LAB — VITAMIN B12: Vitamin B-12: 491 pg/mL (ref 180–914)

## 2018-07-04 LAB — BPAM RBC
Blood Product Expiration Date: 202008042359
ISSUE DATE / TIME: 202007022221
Unit Type and Rh: 7300

## 2018-07-04 LAB — FERRITIN: Ferritin: 8 ng/mL — ABNORMAL LOW (ref 24–336)

## 2018-07-04 LAB — FOLATE: Folate: 6.3 ng/mL (ref >5.9)

## 2018-07-04 MED ORDER — ADULT MULTIVITAMIN W/MINERALS CH
1.0000 | ORAL_TABLET | Freq: Every day | ORAL | Status: DC
  Administered 2018-07-04: 1 via ORAL
  Filled 2018-07-04: qty 1

## 2018-07-04 MED ORDER — ENSURE ENLIVE PO LIQD
237.0000 mL | Freq: Two times a day (BID) | ORAL | Status: DC
  Administered 2018-07-04: 237 mL via ORAL

## 2018-07-04 MED ORDER — FERROUS GLUCONATE 324 (38 FE) MG PO TABS: 324.0000 mg | ORAL_TABLET | Freq: Every day | ORAL | 3 refills | Status: DC

## 2018-07-04 MED ORDER — PANTOPRAZOLE SODIUM 40 MG PO TBEC: 40.0000 mg | DELAYED_RELEASE_TABLET | Freq: Every day | ORAL | 0 refills | Status: AC

## 2018-07-04 MED ORDER — DOCUSATE SODIUM 100 MG PO CAPS: 100.0000 mg | ORAL_CAPSULE | Freq: Two times a day (BID) | ORAL | 2 refills | Status: AC

## 2018-07-04 MED ORDER — SODIUM CHLORIDE 0.9 % IV SOLN
510.0000 mg | Freq: Once | INTRAVENOUS | Status: AC
  Administered 2018-07-04: 510 mg via INTRAVENOUS
  Filled 2018-07-04: qty 17

## 2018-07-04 NOTE — Progress Notes (Signed)
Initial Nutrition Assessment   RD working remotely.  DOCUMENTATION CODES:   Underweight  INTERVENTION:  Ensure Enlive po BID, each supplement provides 350 kcal and 20 grams of protein MVI Provide patient with Iron Rich Nutrition Edu handout via mail   NUTRITION DIAGNOSIS:   Predicted suboptimal nutrient intake related to lethargy/confusion, chronic illness as evidenced by meal completion < 25%, other (comment)(pt wife reports increasing fatigue over past year).   GOAL:   Patient will meet greater than or equal to 90% of their needs   MONITOR:   PO intake, Supplement acceptance, Weight trends, I & O's, Labs  REASON FOR ASSESSMENT:   Malnutrition Screening Tool    ASSESSMENT:  84 year old male with medical history significant for T2DM, CAD, PAD, a-fib, pacemaker, dementia, CKD4, admitted for evaluation of low hemoglobin  Upon admission pt hemoglobin at 6.6 - given 1 unit of packed RBCs. Per MD note; Suspected slow/chronic GI blood loss, likely to d/c with iron supplement. RD to mail iron rich food education to patient home  Per chart review, wife of pt reports progressive fatigue over the year. Pt has no complaints and denies chest pain, abdominal pain, or recent changes in BMs.  Patient eating  25% of 2 recorded meals  Weights reviewed - pt with 1.8lb wt loss x 3 months (insignificant for time frame) Patient has significant history of being underweight. Suspect patient with some degree of malnutrition.  Unable to reach patient in room or via cell   Medications reviewed and include: plavix, remeron, protonix, rivaroxaban  Labs current labs for review  NUTRITION - FOCUSED PHYSICAL EXAM:    Diet Order:   Diet Order            Diet Heart Room service appropriate? Yes; Fluid consistency: Thin  Diet effective now              EDUCATION NEEDS:   Not appropriate for education at this time  Skin:  Skin Assessment: Reviewed RN Assessment  Last BM:   unknown  Height:   Ht Readings from Last 1 Encounters:  07/04/18 5\' 6"  (1.676 m)    Weight:   Wt Readings from Last 1 Encounters:  07/04/18 45.7 kg    Ideal Body Weight:  64.5 kg  BMI:  Body mass index is 16.26 kg/m.  Estimated Nutritional Needs:   Kcal:  1200-1400  Protein:  55-65g  Fluid:  1.2L/day     Lajuan Lines, RD, LDN  After Hours/Weekend Pager: [phone]

## 2018-07-04 NOTE — Progress Notes (Signed)
Thomas Snow to be discharged home per MD order. Discussed prescriptions and follow up appointments with the patient. Prescriptions given to patient; medication list explained in detail. Patient verbalized understanding.  Skin clean, dry and intact without evidence of skin break down, no evidence of skin tears noted. IV catheter discontinued intact. Site without signs and symptoms of complications. Dressing and pressure applied. Pt denies pain at the site currently. No complaints noted.  Patient free of lines, drains, and wounds.   An After Visit Summary (AVS) was printed and given to the patient. Patient escorted via wheelchair, and discharged home via private auto.  Shela Commons, RN

## 2018-07-04 NOTE — Discharge Summary (Addendum)
Physician Discharge Summary  Thomas Snow AJG:[id number] DOB: [birth year] DOA: 07/03/2018  PCP: Cyndi Bender, PA-C  Admit date: 07/03/2018 Discharge date: 07/04/2018  Admitted From: home  Disposition:  home  Recommendations for Outpatient Follow-up:  1. Follow up with PCP in 1-2 weeks 2. Please obtain BMP/CBC in one week  Home Health: Equipment/Devices:  Discharge Condition: stable CODE STATUS:full code Diet recommendation: heart healthy  Brief/Interim Summary: 84 year old male with a history of chronic kidney disease stage IV, paroxysmal atrial fibrillation, coronary artery disease and peripheral arterial disease, on anticoagulation and Plavix, was brought to the hospital with progressive fatigue, shortness of breath on exertion.  Initial work-up indicates a hemoglobin of 6.6.  Stool for occult blood was found to be negative.  His wife provides history due to his dementia.  She has not noted any black, tarry stools.  Patient was admitted to the hospital overnight.  He was transfused 1 unit of PRBC with follow-up hemoglobin improved to 8.6.  Symptomatically, he is feeling better.  He is able to ambulate and does not have any shortness of breath or dizziness.  Anemia panel indicated severe iron deficiency.  This would explain his microcytosis.  If patient's anemia is related to chronic blood loss, this is likely a very slow, chronic process.  He does not appear to have any acute large-volume bleeding.  He is on Xarelto and Plavix.  Discussed with his primary cardiologist Dr. Sallyanne Kuster, who recommended to discontinue Xarelto for now and continue plavix.  He should have a repeat CBC in 1 week.  He received a dose of intravenous iron in the hospital and will be discharged on oral iron replacement therapy.  We will also continue on PPI to cover any element of gastritis leading to chronic blood loss.  Further work-up for iron deficiency anemia to be considered as an outpatient with GI evaluation as  needed.  Patient and his wife feel ready for discharge home.  Discharge Diagnoses:  Principal Problem:   Symptomatic anemia Active Problems:   CAD s/p RCA angioplasty 2001   PAD (peripheral artery disease) (HCC)   Paroxysmal atrial fibrillation (HCC)   CKD (chronic kidney disease) stage 4, GFR 15-29 ml/min (HCC)   Diabetes mellitus type II, non insulin dependent (HCC)   Dementia (HCC)   Iron deficiency anemia due to chronic blood loss    Discharge Instructions  Discharge Instructions    Diet - low sodium heart healthy   Complete by: As directed    Increase activity slowly   Complete by: As directed      Allergies as of 07/04/2018      Reactions   Atorvastatin    Leg pain   Crestor [rosuvastatin]    Leg pain      Medication List    STOP taking these medications   acetaminophen 325 MG tablet Commonly known as: TYLENOL   Xarelto 15 MG Tabs tablet Generic drug: Rivaroxaban     TAKE these medications   clopidogrel 75 MG tablet Commonly known as: PLAVIX Take 1 tablet by mouth once daily with breakfast   docusate sodium 100 MG capsule Commonly known as: Colace Take 1 capsule (100 mg total) by mouth 2 (two) times daily.   donepezil 23 MG Tabs tablet Commonly known as: ARICEPT 23 mg daily.   ferrous gluconate 324 MG tablet Commonly known as: FERGON Take 1 tablet (324 mg total) by mouth daily with breakfast.   memantine 10 MG tablet Commonly known as: NAMENDA Take 1 tablet (  10 mg total) by mouth 2 (two) times daily.   mirtazapine 15 MG tablet Commonly known as: REMERON Take 15 mg by mouth at bedtime.   pantoprazole 40 MG tablet Commonly known as: PROTONIX Take 1 tablet (40 mg total) by mouth daily. Start taking on: July 05, 2018       Allergies  Allergen Reactions  . Atorvastatin     Leg pain  . Crestor [Rosuvastatin]     Leg pain    Consultations:     Procedures/Studies: No results found.    Subjective: Patient is feeling better  today.  He was able to ambulate with staff without shortness of breath or dizziness.  Discharge Exam: Vitals:   07/04/18 0130 07/04/18 0158 07/04/18 0540 07/04/18 0932  BP:  (!) 155/58 (!) 148/63 (!) 123/49  Pulse:  92 78 71  Resp:   19 18  Temp:  98.7 F (37.1 C) 97.7 F (36.5 C) 97.6 F (36.4 C)  TempSrc:  Oral Oral Oral  SpO2:  99% 97% 100%  Weight:      Height: 5\' 6"  (1.676 m)       General: Pt is alert, awake, not in acute distress Cardiovascular: RRR, S1/S2 +, no rubs, no gallops Respiratory: CTA bilaterally, no wheezing, no rhonchi Abdominal: Soft, NT, ND, bowel sounds + Extremities: no edema, no cyanosis    The results of significant diagnostics from this hospitalization (including imaging, microbiology, ancillary and laboratory) are listed below for reference.     Microbiology: Recent Results (from the past 240 hour(s))  SARS Coronavirus 2 (CEPHEID - Performed in [place] hospital lab), Hosp Order     Status: None   Collection Time: 07/03/18 10:05 PM   Specimen: Nasopharyngeal Swab  Result Value Ref Range Status   SARS Coronavirus 2 NEGATIVE NEGATIVE Final    Comment: (NOTE) If result is NEGATIVE SARS-CoV-2 target nucleic acids are NOT DETECTED. The SARS-CoV-2 RNA is generally detectable in upper and lower  respiratory specimens during the acute phase of infection. The lowest  concentration of SARS-CoV-2 viral copies this assay can detect is 250  copies / mL. A negative result does not preclude SARS-CoV-2 infection  and should not be used as the sole basis for treatment or other  patient management decisions.  A negative result may occur with  improper specimen collection / handling, submission of specimen other  than nasopharyngeal swab, presence of viral mutation(s) within the  areas targeted by this assay, and inadequate number of viral copies  (<250 copies / mL). A negative result must be combined with clinical  observations, patient history, and  epidemiological information. If result is POSITIVE SARS-CoV-2 target nucleic acids are DETECTED. The SARS-CoV-2 RNA is generally detectable in upper and lower  respiratory specimens dur ing the acute phase of infection.  Positive  results are indicative of active infection with SARS-CoV-2.  Clinical  correlation with patient history and other diagnostic information is  necessary to determine patient infection status.  Positive results do  not rule out bacterial infection or co-infection with other viruses. If result is PRESUMPTIVE POSTIVE SARS-CoV-2 nucleic acids MAY BE PRESENT.   A presumptive positive result was obtained on the submitted specimen  and confirmed on repeat testing.  While 2019 novel coronavirus  (SARS-CoV-2) nucleic acids may be present in the submitted sample  additional confirmatory testing may be necessary for epidemiological  and / or clinical management purposes  to differentiate between  SARS-CoV-2 and other Sarbecovirus currently known to infect  humans.  If clinically indicated additional testing with an alternate test  methodology [phone]) is advised. The SARS-CoV-2 RNA is generally  detectable in upper and lower respiratory sp ecimens during the acute  phase of infection. The expected result is Negative. Fact Sheet for Patients:  StrictlyIdeas.no Fact Sheet for Healthcare Providers: BankingDealers.co.za This test is not yet approved or cleared by the [place] FDA and has been authorized for detection and/or diagnosis of SARS-CoV-2 by FDA under an Emergency Use Authorization (EUA).  This EUA will remain in effect (meaning this test can be used) for the duration of the COVID-19 declaration under Section 564(b)(1) of the Act, 21 U.S.C. section 360bbb-3(b)(1), unless the authorization is terminated or revoked sooner. Performed at [facility], [place] [address], [place], [place] [address]       Labs: BNP (last 3 results) No results for input(s): BNP in the last 8760 hours. Basic Metabolic Panel: Recent Labs  Lab 07/03/18 2038 07/04/18 1120  NA 137 138  K 3.6 4.3  CL 104 109  CO2 24 19*  GLUCOSE 146* 176*  BUN 27* 23  CREATININE 2.54* 2.22*  CALCIUM 8.6* 8.0*   Liver Function Tests: Recent Labs  Lab 07/03/18 2038 07/04/18 1120  AST 19 16  ALT 10 10  ALKPHOS 60 53  BILITOT 0.7 0.7  PROT 7.0 5.7*  ALBUMIN 3.6 3.1*   No results for input(s): LIPASE, AMYLASE in the last 168 hours. No results for input(s): AMMONIA in the last 168 hours. CBC: Recent Labs  Lab 07/03/18 2038 07/04/18 1120  WBC 10.7* 9.5  HGB 6.6* 8.6*  HCT 25.3* 30.5*  MCV 65.7* 68.2*  PLT 568* 455*   Cardiac Enzymes: No results for input(s): CKTOTAL, CKMB, CKMBINDEX, TROPONINI in the last 168 hours. BNP: Invalid input(s): POCBNP CBG: Recent Labs  Lab 07/04/18 0705  GLUCAP 125*   D-Dimer No results for input(s): DDIMER in the last 72 hours. Hgb A1c No results for input(s): HGBA1C in the last 72 hours. Lipid Profile No results for input(s): CHOL, HDL, LDLCALC, TRIG, CHOLHDL, LDLDIRECT in the last 72 hours. Thyroid function studies No results for input(s): TSH, T4TOTAL, T3FREE, THYROIDAB in the last 72 hours.  Invalid input(s): FREET3 Anemia work up Recent Labs    07/04/18 1120  VITAMINB12 491  FOLATE 6.3  FERRITIN 8*  TIBC 395  IRON 15*  RETICCTPCT 1.2   Urinalysis    Component Value Date/Time   COLORURINE YELLOW 06/04/2016 2051   APPEARANCEUR CLEAR 06/04/2016 2051   LABSPEC 1.033 (H) 06/04/2016 2051   PHURINE 7.0 06/04/2016 2051   GLUCOSEU NEGATIVE 06/04/2016 2051   HGBUR LARGE (A) 06/04/2016 2051   [place] NEGATIVE 06/04/2016 2051   [place] NEGATIVE 06/04/2016 2051   PROTEINUR NEGATIVE 06/04/2016 2051   UROBILINOGEN 0.2 02/06/2014 1258   NITRITE NEGATIVE 06/04/2016 2051   LEUKOCYTESUR NEGATIVE 06/04/2016 2051   Sepsis Labs Invalid input(s):  PROCALCITONIN,  WBC,  LACTICIDVEN Microbiology Recent Results (from the past 240 hour(s))  SARS Coronavirus 2 (CEPHEID - Performed in [place] hospital lab), Hosp Order     Status: None   Collection Time: 07/03/18 10:05 PM   Specimen: Nasopharyngeal Swab  Result Value Ref Range Status   SARS Coronavirus 2 NEGATIVE NEGATIVE Final    Comment: (NOTE) If result is NEGATIVE SARS-CoV-2 target nucleic acids are NOT DETECTED. The SARS-CoV-2 RNA is generally detectable in upper and lower  respiratory specimens during the acute phase of infection. The lowest  concentration of SARS-CoV-2  viral copies this assay can detect is 250  copies / mL. A negative result does not preclude SARS-CoV-2 infection  and should not be used as the sole basis for treatment or other  patient management decisions.  A negative result may occur with  improper specimen collection / handling, submission of specimen other  than nasopharyngeal swab, presence of viral mutation(s) within the  areas targeted by this assay, and inadequate number of viral copies  (<250 copies / mL). A negative result must be combined with clinical  observations, patient history, and epidemiological information. If result is POSITIVE SARS-CoV-2 target nucleic acids are DETECTED. The SARS-CoV-2 RNA is generally detectable in upper and lower  respiratory specimens dur ing the acute phase of infection.  Positive  results are indicative of active infection with SARS-CoV-2.  Clinical  correlation with patient history and other diagnostic information is  necessary to determine patient infection status.  Positive results do  not rule out bacterial infection or co-infection with other viruses. If result is PRESUMPTIVE POSTIVE SARS-CoV-2 nucleic acids MAY BE PRESENT.   A presumptive positive result was obtained on the submitted specimen  and confirmed on repeat testing.  While 2019 novel coronavirus  (SARS-CoV-2) nucleic acids may be present in the  submitted sample  additional confirmatory testing may be necessary for epidemiological  and / or clinical management purposes  to differentiate between  SARS-CoV-2 and other Sarbecovirus currently known to infect humans.  If clinically indicated additional testing with an alternate test  methodology [phone]) is advised. The SARS-CoV-2 RNA is generally  detectable in upper and lower respiratory sp ecimens during the acute  phase of infection. The expected result is Negative. Fact Sheet for Patients:  StrictlyIdeas.no Fact Sheet for Healthcare Providers: BankingDealers.co.za This test is not yet approved or cleared by the [place] FDA and has been authorized for detection and/or diagnosis of SARS-CoV-2 by FDA under an Emergency Use Authorization (EUA).  This EUA will remain in effect (meaning this test can be used) for the duration of the COVID-19 declaration under Section 564(b)(1) of the Act, 21 U.S.C. section 360bbb-3(b)(1), unless the authorization is terminated or revoked sooner. Performed at [facility], [place] [address], [place], [place] [address]      Time coordinating discharge: 78mins  SIGNED:   Kathie Dike, MD  [facility] 07/04/2018, 2:21 PM   If 7PM-7AM, please contact night-coverage [email or web address]

## 2018-07-04 NOTE — Plan of Care (Signed)
  Problem: Education: Goal: Knowledge of General Education information will improve Description Including pain rating scale, medication(s)/side effects and non-pharmacologic comfort measures Outcome: Progressing   

## 2018-07-04 NOTE — Progress Notes (Signed)
New Admission Note:   Arrival Method: via stretcher from ED Mental Orientation: alert to seft Telemetry: ON box12 Assessment: Completed Skin: Intact, refer to flowsheet IV: right AC Pain: denies Tubes: None Safety Measures: Safety Fall Prevention Plan has been discussed  Admission: conpleted 5 [place] Orientation: Patient has been orientated to the room, unit and staff.   Family: wife  Orders to be reviewed and implemented. Will continue to monitor the patient. Call light has been placed within reach and bed alarm has been activated.

## 2018-07-07 ENCOUNTER — Telehealth: Payer: Self-pay | Admitting: *Deleted

## 2018-07-07 NOTE — Telephone Encounter (Signed)
The patient's wife was already aware and it was changed in epic.  Per Dr. Sallyanne Kuster:  Please tell him I reviewed the records of his recent hospitalization and I think it is best to stop his xarelto. Continue clopidogrel please.

## 2018-07-10 DIAGNOSIS — Z681 Body mass index (BMI) 19 or less, adult: Secondary | ICD-10-CM | POA: Diagnosis not present

## 2018-07-10 DIAGNOSIS — Z09 Encounter for follow-up examination after completed treatment for conditions other than malignant neoplasm: Secondary | ICD-10-CM | POA: Diagnosis not present

## 2018-07-10 DIAGNOSIS — D509 Iron deficiency anemia, unspecified: Secondary | ICD-10-CM | POA: Diagnosis not present

## 2018-08-01 ENCOUNTER — Telehealth: Payer: Self-pay | Admitting: *Deleted

## 2018-08-01 NOTE — Telephone Encounter (Signed)
Left a message to confirm the patient's appointment and to see if he could move his appointment to 12:40

## 2018-08-04 ENCOUNTER — Other Ambulatory Visit: Payer: Self-pay

## 2018-08-04 ENCOUNTER — Ambulatory Visit (INDEPENDENT_AMBULATORY_CARE_PROVIDER_SITE_OTHER): Payer: PPO | Admitting: Cardiovascular Disease

## 2018-08-04 ENCOUNTER — Encounter: Payer: Self-pay | Admitting: Cardiovascular Disease

## 2018-08-04 VITALS — BP 99/57 | HR 86 | Temp 97.2°F | Wt 109.6 lb

## 2018-08-04 DIAGNOSIS — I774 Celiac artery compression syndrome: Secondary | ICD-10-CM

## 2018-08-04 DIAGNOSIS — I10 Essential (primary) hypertension: Secondary | ICD-10-CM | POA: Diagnosis not present

## 2018-08-04 DIAGNOSIS — Z950 Presence of cardiac pacemaker: Secondary | ICD-10-CM | POA: Diagnosis not present

## 2018-08-04 DIAGNOSIS — I480 Paroxysmal atrial fibrillation: Secondary | ICD-10-CM | POA: Diagnosis not present

## 2018-08-04 DIAGNOSIS — I771 Stricture of artery: Secondary | ICD-10-CM

## 2018-08-04 DIAGNOSIS — E119 Type 2 diabetes mellitus without complications: Secondary | ICD-10-CM

## 2018-08-04 DIAGNOSIS — R64 Cachexia: Secondary | ICD-10-CM | POA: Diagnosis not present

## 2018-08-04 DIAGNOSIS — I2510 Atherosclerotic heart disease of native coronary artery without angina pectoris: Secondary | ICD-10-CM

## 2018-08-04 DIAGNOSIS — I6523 Occlusion and stenosis of bilateral carotid arteries: Secondary | ICD-10-CM

## 2018-08-04 DIAGNOSIS — I495 Sick sinus syndrome: Secondary | ICD-10-CM

## 2018-08-04 DIAGNOSIS — E781 Pure hyperglyceridemia: Secondary | ICD-10-CM | POA: Diagnosis not present

## 2018-08-04 NOTE — Patient Instructions (Signed)
Medication Instructions:  Your physician recommends that you continue on your current medications as directed. Please refer to the Current Medication list given to you today.  If you need a refill on your cardiac medications before your next appointment, please call your pharmacy.   Lab work: None ordered If you have labs (blood work) drawn today and your tests are completely normal, you will receive your results only by: [place] (if you have MyChart) OR A paper copy in the mail If you have any lab test that is abnormal or we need to change your treatment, we will call you to review the results.  Testing/Procedures: None ordered  Follow-Up: At [facility], you and your health needs are our priority.  As part of our continuing mission to provide you with exceptional heart care, we have created designated Provider Care Teams.  These Care Teams include your primary Cardiologist (physician) and Advanced Practice Providers (APPs -  Physician Assistants and Nurse Practitioners) who all work together to provide you with the care you need, when you need it. You will need a follow up appointment in 6 months for an in office device check.  Please call our office 2 months in advance to schedule this appointment.  You may see Sanda Klein, MD or one of the following Advanced Practice Providers on your designated Care Team: Almyra Deforest, PA-C Fabian Sharp, [place]

## 2018-08-04 NOTE — Progress Notes (Signed)
Patient ID: Thomas Snow, male   DOB: 09-05-1934, 84 y.o.   MRN: [id number]     Cardiology Office Note    Date:  02/01/2015   ID:  Thomas Snow, DOB [birth year], MRN [id number]  PCP:  Fae Pippin  Cardiologist:   Sanda Klein, MD   Chief Complaint  Pacemaker check           History of Present Illness:  Thomas Snow is a 84 y.o. male with a history of intermittent sinus node arrest and recurrent syncope that led to pacemaker implantation in 1985. He has a history paroxysmal atrial fibrillation and is on chronic anticoagulation with Xarelto. He also has an extensive history of atherosclerotic complications in the coronary, carotid, extremity, renal and mesenteric beds.  He presents for follow-up on pacemaker.  He has slowly worsening dementia.  He is generally calm and his wife handles him well.  Over the last few years he has lost a lot of weight but this has now stabilized and he has a BMI in the 17-18 range.  He occasionally complains of leg pain in the middle of the night but does not have classic intermittent claudication.  He denies chest pain, dyspnea, leg edema, focal neurological deficits, palpitations or syncope.  Pacemaker interrogation shows normal device function. His atrial and ventricular leads are still the original 5 mm tip unipolar leads placed in 1985. His generator is a [employer] risk analyst DR device implanted in 2006 and still has 2.5-5.75 years of estimated longevity. He only has 4 % atrial pacing and < 1% ventricular pacing. The burden of atrial arrhythmia appears to be very low , under 1%.  His device is not capable of recording electrograms, so it is impossible to say whether he truly had brief episodes of atrial fibrillation or less of the chest electromechanical interference on his unipolar leads.  Judging by easily reproducible electromechanical interference in the office today, I suspect that it is mostly if not entirely artifact.  I have never seen  convincing evidence of atrial fibrillation.  Comprehensive testing of sensing, pacing and capture threshold and impedance is performed on both the atrial and the ventricular lead.  He had cutting balloon angioplasty for a right coronary artery stenosis in 2001, without stent placement. Ischemia was detected by nuclear testing and he never had angina or dyspnea. He remains asymptomatic and had a low-risk nuclear study in 2012. He has preserved left ventricular systolic function.  He received a stent to the left subclavian artery for subclavian steal syndrome in 2001. He is now asymptomatic from the standpoint and his most recent ultrasound shows moderate bilateral subclavian artery stenosis.  He received a stent to the left renal artery in 2005. His most recent ultrasound shows mild bilateral renal artery stenosis, less than 60%.  He had a transient ischemic attack in 2013 and underwent a right carotid endarterectomy.  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  He is intolerant to statins including both atorvastatin and rosuvastatin.   Past Medical History  Diagnosis Date  . Hyperlipidemia   . Hypertension   . Pneumonia     as a child  . Pacemaker   . Arthritis   . Coronary artery disease     prox and mid RCA atherectomy '01  . Carotid artery occlusion     left SCA stent '01, carotid occlusive dz    Past Surgical  History  Procedure Laterality Date  . Kidney stone surgery    . Cardiac catheterization    . Coronary angioplasty    . Kidney stent      left RAS '05  . Tonsillectomy    . Back surgery      approx 20 years ago  . Eye surgery      bilateral cataract removal  . Knee surgery      right knee  . Cardiac pacemaker placement      [place] '85, '95; [place] '06  . Endarterectomy  09/05/2011    Procedure: ENDARTERECTOMY CAROTID;  Surgeon: Mal Misty,  MD;  Location: [facility];  Service: Vascular;  Laterality: Right;  . Carotid endarterectomy Right 09-05-11    cea  . Peripheral vascular angiogram  01/22/2003    Left subclavian 85% fairly focal in-stent restenosis, dilated with a 7x2cm Powerflex balloon at 10-30 and 10-30, resulting in less than 10% residual narrowing. Left renal artery demonstrated 80-85% initially dilated with a 4x15 mm coronary cutting balloon at 10-30 and 12-30, exchanged for a 53mmx2cm Aviator balloon inflations were done at 6-15, 10-30, and 10-30, resulting less than 10% residual.  . Peripheral vascular angiogram  07/13/1999    Left subclavian 85% dilated with a 2mmx2cm Cordis Power-Flex balloon at 6-40, a P-204 iliac stent was hand-crimped on a 33mmx2cm Cordis Power-Flex balloon, expanded at 8-40 with reduction to 0%. Left Renal artery 75-80% stenosis, dilated with a 11mmx1.5cm Cordis Power-Flex balloon at 6-40, reduction of stenosis from 80% to 0%.  . Renal doppler  09/25/2012    Celiac artery and SMA-demonstrates narrowing with increased velocities consistent with greater than 50%, right renal- 1-59% diameter reduction, left renal artery stent- 1-59% diameter reduction.  . Cardiac catheterization  03/09/1999    RCA high-grade 95% stenosis, inflated with a 3.25 IVT cutting balloon at 6-64, 6-41, 6-52, and 6-53, resulting in reductiong of less than 10%. RCA proximal 75% stenosis inflated with a 2.75x10 IVT cutting balloon at 6-65 then exchanged for a 3x10 IVT cutting balloon inflated at 6-83.  . Cardiovascular stress test  08/09/2010    Normal pattern of perfusion in all regions. No ECG changes. EKG negative for ischemia.  . Transthoracic echocardiogram  09-25-2012    EF 55-60%, mild-moderate tricuspid valve regurg  . Icd generator change  10/20/2004    Implantation of [facility], model # 5357M/s, serial # [id number]    Outpatient Prescriptions Prior to Visit  Medication Sig Dispense Refill  . acetaminophen (TYLENOL)  325 MG tablet Take 650 mg by mouth 3 (three) times daily.    . fish oil-omega-3 fatty acids 1000 MG capsule Take 1 g by mouth daily.    . rivaroxaban (XARELTO) 20 MG TABS tablet Take 1 tablet (20 mg total) by mouth daily with supper. (Patient taking differently: Take 20 mg by mouth at bedtime. ) 30 tablet 11  . cefUROXime (CEFTIN) 500 MG tablet Take 1 tablet (500 mg total) by mouth 2 (two) times daily with a meal. (Patient not taking: Reported on 02/01/2015) 6 tablet 0  . HYDROcodone-acetaminophen (NORCO/VICODIN) 5-325 MG per tablet Take 1 tablet by mouth every 6 (six) hours as needed. (Patient not taking: Reported on 02/01/2015) 15 tablet 0  . OVER THE COUNTER MEDICATION Take by mouth daily. Reported on 02/01/2015    . phenol (CHLORASEPTIC) 1.4 % LIQD Use as directed 1 spray in the mouth or throat as needed for throat irritation / pain. (Patient not  taking: Reported on 02/01/2015) 1 Bottle 0  . phenylephrine (SUDAFED PE) 10 MG TABS tablet Take 20 mg by mouth every 6 (six) hours as needed. Reported on 02/01/2015    . sodium chloride (OCEAN) 0.65 % SOLN nasal spray Place 1 spray into both nostrils as needed for congestion. Reported on 02/01/2015     No facility-administered medications prior to visit.     Allergies:   Atorvastatin and Crestor   Social History   Social History  . Marital Status: Married    Spouse Name: N/A  . Number of Children: N/A  . Years of Education: N/A   Social History Main Topics  . Smoking status: Former Smoker -- 0.50 packs/day for 25 years    Types: Cigarettes    Quit date: 08/19/1996  . Smokeless tobacco: Current User    Types: Snuff  . Alcohol Use: No  . Drug Use: No  . Sexual Activity: Not Asked   Other Topics Concern  . None   Social History Narrative     Family History:  The patient's family history includes Arthritis in his father.   ROS:   Please see the history of present illness.    ROS All other systems reviewed and are negative.   PHYSICAL  EXAM:    Vitals:   08/04/18 1046  BP: (!) 99/57  Pulse: 86  Temp: (!) 97.2 F (36.2 C)  SpO2: 97%  Weight: 109 lb 9.6 oz (49.7 kg)     General: Alert, oriented x3, no distress, underweight Head: no evidence of trauma, PERRL, EOMI, no exophtalmos or lid lag, no myxedema, no xanthelasma; normal ears, nose and oropharynx Neck: normal jugular venous pulsations and no hepatojugular reflux; brisk carotid pulses without delay and no carotid bruits Chest: clear to auscultation, no signs of consolidation by percussion or palpation, normal fremitus, symmetrical and full respiratory excursions Cardiovascular: normal position and quality of the apical impulse, regular rhythm, normal first and second heart sounds, 2/6 holosystolic murmur at the left lower sternal border, no diastolic murmurs, rubs or gallops.  Healthy pacemaker site in the right subclavian area Abdomen: no tenderness or distention, no masses by palpation, no abnormal pulsatility or arterial bruits, normal bowel sounds, no hepatosplenomegaly Extremities: no clubbing, cyanosis or edema; 2+ radial, ulnar and brachial pulses bilaterally; 2+ right femoral, posterior tibial and dorsalis pedis pulses; 2+ left femoral, posterior tibial and dorsalis pedis pulses; no subclavian or femoral bruits Neurological: grossly nonfocal Psych: Normal mood and affect, clear evidence of short-term memory problems   Wt Readings from Last 3 Encounters:  02/01/15 49.045 kg (108 lb 2 oz)  08/10/14 45.768 kg (100 lb 14.4 oz)  02/08/14 50.44 kg (111 lb 3.2 oz)      Studies/Labs Reviewed:   EKG: The intracardiac electrogram demonstrates atrial sensed, ventricular sensed rhythm.  BMET    Component Value Date/Time   NA 138 07/04/2018 1120   K 4.3 07/04/2018 1120   CL 109 07/04/2018 1120   CO2 19 (L) 07/04/2018 1120   GLUCOSE 176 (H) 07/04/2018 1120   BUN 23 07/04/2018 1120   CREATININE 2.22 (H) 07/04/2018 1120   CREATININE 1.60 (H) 05/30/2016 1122    CALCIUM 8.0 (L) 07/04/2018 1120   GFRNONAA 27 (L) 07/04/2018 1120   GFRNONAA 40 (L) 05/30/2016 1122   GFRAA 31 (L) 07/04/2018 1120   GFRAA 46 (L) 05/30/2016 1122   Lipid Panel     Component Value Date/Time   CHOL 168 06/18/2012 1043   TRIG 183 (  H) 06/18/2012 1043   HDL 39 (L) 06/18/2012 1043   CHOLHDL 4.3 06/18/2012 1043   VLDL 37 06/18/2012 1043   LDLCALC 92 06/18/2012 1043     Lipid Panel    Component Value Date/Time   CHOL 168 06/18/2012 1043   TRIG 183* 06/18/2012 1043   HDL 39* 06/18/2012 1043   CHOLHDL 4.3 06/18/2012 1043   VLDL 37 06/18/2012 1043   LDLCALC 92 06/18/2012 1043    ASSESSMENT:     1. Paroxysmal atrial fibrillation (HCC)   2. SSS (sick sinus syndrome) ([place])   3. Pacemaker - dual chamber, implanted in 1985, last generator change 2006, [employer]   4. Carotid stenosis, bilateral s/p R CEA 2013   5. Subclavian arterial stenosis (HCC)   6. Celiac artery stenosis (HCC)   7. Coronary artery disease involving native coronary artery of native heart without angina pectoris   8. Hypertriglyceridemia   9. Essential hypertension   10. Diabetes mellitus type II, non insulin dependent ([place])   11. Cachexia ([place])      PLAN:  In order of problems listed above:  1. Afib: Questionable diagnosis.  Suspect most of his episodes of "A. fib" are related to oversensing on the unipolar atrial lead.  Anticoagulants have been discontinued. 2. SSS/PPM: Normally functioning dual-chamber device.  He still has his original 5.6 mm unipolar leads from [place] and there are no longer pacemaker generators being manufactured that can be attached to the leads.  Thankfully he reports very little pacing.  He is not a candidate for lead extraction.  If necessary, he should undergo implantation of a new device from the contralateral side. 3. Carotid stenosis: Asymptomatic.  Previous right carotid endarterectomy.   4. Bilateral subclavian artery stenosis he does not have limb claudication or  symptoms of vertebral steal syndrome.  He typically has a 20 mmHg gradient between the right (higher) and left (lower) upper extremity blood pressures. 5. Celiac and SMA stenoses: He has never described intestinal angina.  Although he lost a lot of weight in years past, his weight is been stable for the last 3 years or so.  No clear evidence to support malabsorption. 6. CAD: No angina pectoris.  Also very sedentary..  Last functional study was a normal nuclear scan in 2012. 7. HTN: Normal values recorded for his blood pressure.  Hard to say whether this is truly representative of a central aortic pressure since he has stenoses in all his extremities. 8. HLP: Excellent LDL cholesterol at 60, recently checked.  It is quite surprising that despite his very lean body habitus he still has metabolic parameters consistent with diabetes mellitus (A1c 7.3%) and hypertriglyceridemia. 9. Underweight: He is clearly underweight but does not appear to be truly malnourished.  Patient Instructions  Medication Instructions:  Your physician recommends that you continue on your current medications as directed. Please refer to the Current Medication list given to you today.  If you need a refill on your cardiac medications before your next appointment, please call your pharmacy.   Lab work: None ordered If you have labs (blood work) drawn today and your tests are completely normal, you will receive your results only by: [place] (if you have MyChart) OR A paper copy in the mail If you have any lab test that is abnormal or we need to change your treatment, we will call you to review the results.  Testing/Procedures: None ordered  Follow-Up: At [facility], you and your health needs are our  priority.  As part of our continuing mission to provide you with exceptional heart care, we have created designated Provider Care Teams.  These Care Teams include your primary Cardiologist (physician) and Advanced  Practice Providers (APPs -  Physician Assistants and Nurse Practitioners) who all work together to provide you with the care you need, when you need it. You will need a follow up appointment in 6 months for an in office device check.  Please call our office 2 months in advance to schedule this appointment.  You may see Sanda Klein, MD or one of the following Advanced Practice Providers on your designated Care Team: Almyra Deforest, [place] Fabian Sharp, Heide Guile, MD  02/01/2015 4:48 PM    [facility] [place], [place], [place]  [address] Phone: [phone]; Fax: [phone]

## 2018-08-21 DIAGNOSIS — D509 Iron deficiency anemia, unspecified: Secondary | ICD-10-CM | POA: Diagnosis not present

## 2018-08-21 DIAGNOSIS — F0390 Unspecified dementia without behavioral disturbance: Secondary | ICD-10-CM | POA: Diagnosis not present

## 2018-08-21 DIAGNOSIS — I2510 Atherosclerotic heart disease of native coronary artery without angina pectoris: Secondary | ICD-10-CM | POA: Diagnosis not present

## 2018-08-21 DIAGNOSIS — Z681 Body mass index (BMI) 19 or less, adult: Secondary | ICD-10-CM | POA: Diagnosis not present

## 2018-08-21 DIAGNOSIS — I480 Paroxysmal atrial fibrillation: Secondary | ICD-10-CM | POA: Diagnosis not present

## 2018-10-09 DIAGNOSIS — Z23 Encounter for immunization: Secondary | ICD-10-CM | POA: Diagnosis not present

## 2018-11-21 DIAGNOSIS — Z681 Body mass index (BMI) 19 or less, adult: Secondary | ICD-10-CM | POA: Diagnosis not present

## 2018-11-21 DIAGNOSIS — D509 Iron deficiency anemia, unspecified: Secondary | ICD-10-CM | POA: Diagnosis not present

## 2018-11-21 DIAGNOSIS — I480 Paroxysmal atrial fibrillation: Secondary | ICD-10-CM | POA: Diagnosis not present

## 2018-11-21 DIAGNOSIS — F0390 Unspecified dementia without behavioral disturbance: Secondary | ICD-10-CM | POA: Diagnosis not present

## 2018-11-21 DIAGNOSIS — E119 Type 2 diabetes mellitus without complications: Secondary | ICD-10-CM | POA: Diagnosis not present

## 2018-11-21 DIAGNOSIS — I2510 Atherosclerotic heart disease of native coronary artery without angina pectoris: Secondary | ICD-10-CM | POA: Diagnosis not present

## 2018-11-21 DIAGNOSIS — Z1331 Encounter for screening for depression: Secondary | ICD-10-CM | POA: Diagnosis not present

## 2018-12-17 DIAGNOSIS — R05 Cough: Secondary | ICD-10-CM | POA: Diagnosis not present

## 2018-12-19 DIAGNOSIS — H26493 Other secondary cataract, bilateral: Secondary | ICD-10-CM | POA: Diagnosis not present

## 2019-01-01 DIAGNOSIS — E119 Type 2 diabetes mellitus without complications: Secondary | ICD-10-CM | POA: Diagnosis not present

## 2019-01-01 DIAGNOSIS — D509 Iron deficiency anemia, unspecified: Secondary | ICD-10-CM | POA: Diagnosis not present

## 2019-01-01 DIAGNOSIS — I2510 Atherosclerotic heart disease of native coronary artery without angina pectoris: Secondary | ICD-10-CM | POA: Diagnosis not present

## 2019-01-01 DIAGNOSIS — F0390 Unspecified dementia without behavioral disturbance: Secondary | ICD-10-CM | POA: Diagnosis not present

## 2019-01-01 DIAGNOSIS — R05 Cough: Secondary | ICD-10-CM | POA: Diagnosis not present

## 2019-01-01 DIAGNOSIS — Z681 Body mass index (BMI) 19 or less, adult: Secondary | ICD-10-CM | POA: Diagnosis not present

## 2019-01-08 DIAGNOSIS — Z9181 History of falling: Secondary | ICD-10-CM | POA: Diagnosis not present

## 2019-01-08 DIAGNOSIS — Z1331 Encounter for screening for depression: Secondary | ICD-10-CM | POA: Diagnosis not present

## 2019-01-08 DIAGNOSIS — Z0000 Encounter for general adult medical examination without abnormal findings: Secondary | ICD-10-CM | POA: Diagnosis not present

## 2019-01-08 DIAGNOSIS — E785 Hyperlipidemia, unspecified: Secondary | ICD-10-CM | POA: Diagnosis not present

## 2019-01-19 ENCOUNTER — Other Ambulatory Visit: Payer: Self-pay

## 2019-01-19 ENCOUNTER — Other Ambulatory Visit: Payer: Self-pay | Admitting: Cardiovascular Disease

## 2019-01-19 ENCOUNTER — Ambulatory Visit (HOSPITAL_COMMUNITY)
Admission: RE | Admit: 2019-01-19 | Discharge: 2019-01-19 | Disposition: A | Discharge status: Home / Self Care | Payer: PPO | Source: Ambulatory Visit | Attending: Cardiovascular Disease | Admitting: Cardiovascular Disease

## 2019-01-19 ENCOUNTER — Encounter (INDEPENDENT_AMBULATORY_CARE_PROVIDER_SITE_OTHER): Payer: Self-pay

## 2019-01-19 DIAGNOSIS — Z95820 Peripheral vascular angioplasty status with implants and grafts: Secondary | ICD-10-CM

## 2019-01-19 DIAGNOSIS — I739 Peripheral vascular disease, unspecified: Secondary | ICD-10-CM

## 2019-01-20 DIAGNOSIS — I739 Peripheral vascular disease, unspecified: Secondary | ICD-10-CM

## 2019-02-18 ENCOUNTER — Ambulatory Visit (HOSPITAL_COMMUNITY)
Admission: RE | Admit: 2019-02-18 | Payer: PPO | Source: Ambulatory Visit | Attending: Cardiovascular Disease | Admitting: Cardiovascular Disease

## 2019-02-26 ENCOUNTER — Other Ambulatory Visit: Payer: Self-pay

## 2019-02-26 ENCOUNTER — Ambulatory Visit (HOSPITAL_COMMUNITY)
Admission: RE | Admit: 2019-02-26 | Discharge: 2019-02-26 | Disposition: A | Discharge status: Home / Self Care | Payer: PPO | Source: Ambulatory Visit | Attending: Cardiology | Admitting: Cardiology

## 2019-02-26 ENCOUNTER — Other Ambulatory Visit (HOSPITAL_COMMUNITY): Payer: Self-pay | Admitting: Cardiovascular Disease

## 2019-02-26 DIAGNOSIS — I6523 Occlusion and stenosis of bilateral carotid arteries: Secondary | ICD-10-CM | POA: Insufficient documentation

## 2019-02-26 DIAGNOSIS — Z9889 Other specified postprocedural states: Secondary | ICD-10-CM

## 2019-02-27 DIAGNOSIS — I6523 Occlusion and stenosis of bilateral carotid arteries: Secondary | ICD-10-CM

## 2019-03-02 ENCOUNTER — Encounter: Payer: Self-pay | Admitting: Cardiovascular Disease

## 2019-03-02 ENCOUNTER — Other Ambulatory Visit: Payer: Self-pay

## 2019-03-02 ENCOUNTER — Ambulatory Visit (INDEPENDENT_AMBULATORY_CARE_PROVIDER_SITE_OTHER): Payer: PPO | Admitting: Cardiovascular Disease

## 2019-03-02 VITALS — BP 138/78 | HR 89 | Temp 97.0°F | Ht 66.0 in | Wt 111.2 lb

## 2019-03-02 DIAGNOSIS — I480 Paroxysmal atrial fibrillation: Secondary | ICD-10-CM | POA: Diagnosis not present

## 2019-03-02 DIAGNOSIS — N184 Chronic kidney disease, stage 4 (severe): Secondary | ICD-10-CM

## 2019-03-02 DIAGNOSIS — Z950 Presence of cardiac pacemaker: Secondary | ICD-10-CM

## 2019-03-02 DIAGNOSIS — Z9889 Other specified postprocedural states: Secondary | ICD-10-CM

## 2019-03-02 DIAGNOSIS — I771 Stricture of artery: Secondary | ICD-10-CM

## 2019-03-02 DIAGNOSIS — R636 Underweight: Secondary | ICD-10-CM

## 2019-03-02 DIAGNOSIS — I10 Essential (primary) hypertension: Secondary | ICD-10-CM

## 2019-03-02 DIAGNOSIS — I6523 Occlusion and stenosis of bilateral carotid arteries: Secondary | ICD-10-CM

## 2019-03-02 DIAGNOSIS — I495 Sick sinus syndrome: Secondary | ICD-10-CM | POA: Diagnosis not present

## 2019-03-02 DIAGNOSIS — I708 Atherosclerosis of other arteries: Secondary | ICD-10-CM

## 2019-03-02 DIAGNOSIS — K559 Vascular disorder of intestine, unspecified: Secondary | ICD-10-CM | POA: Diagnosis not present

## 2019-03-02 DIAGNOSIS — I2510 Atherosclerotic heart disease of native coronary artery without angina pectoris: Secondary | ICD-10-CM | POA: Diagnosis not present

## 2019-03-02 DIAGNOSIS — E782 Mixed hyperlipidemia: Secondary | ICD-10-CM

## 2019-03-02 NOTE — Progress Notes (Addendum)
Patient ID: Thomas Snow, male   DOB: 02-17-1934, 85 y.o.   MRN: [id number]     Cardiology Office Note    Date:  02/01/2015   ID:  Thomas Snow, DOB 1934-03-20, MRN [id number]  PCP:  Fae Pippin  Cardiologist:   Sanda Klein, MD   Chief Complaint  Pacemaker check           History of Present Illness:  Thomas Snow is a 85 y.o. male with a history of intermittent sinus node arrest and recurrent syncope that led to pacemaker implantation in 1985.  He still has the original 5 mm tip unipolar leads and has undergone several generator change outs.  He has a questionable history of paroxysmal atrial fibrillation (versus oversensing due to electromechanical interference with unipolar leads). He also has an extensive history of atherosclerotic complications in the coronary, carotid, extremity, renal and mesenteric beds.  All his chronic cardiac problems have been remarkably quiescent sent for many years.  He is completely asymptomatic.  Unfortunately he has slowly progressive dementia.  He continues to live at home with his wife.  The patient specifically denies any chest pain at rest exertion, dyspnea at rest or with exertion, orthopnea, paroxysmal nocturnal dyspnea, syncope, palpitations, focal neurological deficits, intermittent claudication, lower extremity edema, unexplained weight gain, cough, hemoptysis or wheezing.  Hospitalized in July 2020 with symptomatic iron deficiency anemia (hemoglobin 6.6) but occult blood stool testing was negative.  Xarelto was stopped.  The clopidogrel was continued.  Hemoglobin in November was back up to 15.  Pacemaker interrogation shows normal device function. His atrial and ventricular leads are still the original 5 mm tip unipolar leads placed in 1985. His generator is a [employer] risk [employer] DR device implanted in 2006 and still has 1.5-4.75 years of estimated longevity. He only has 9 % atrial pacing and < 1% ventricular pacing.  As before,  he has frequent but extremely brief episodes of atrial mode switch the burden of atrial arrhythmia appears to be very low , under 1%.  His device is not capable of recording electrograms, so it is impossible to say whether he truly had brief episodes of atrial fibrillation or (much more likely) electromechanical interference on his unipolar leads.  Electromechanical interference can easily be reproduced today. Comprehensive testing of sensing, pacing and capture threshold and impedance is performed today on both the atrial and the ventricular lead.  Atrial lead capture threshold has become a little higher and the appropriate adjustment in atrial pacing output was made today.  He had cutting balloon angioplasty for a right coronary artery stenosis in 2001, without stent placement. Ischemia was detected by nuclear testing and he never had angina or dyspnea. He remains asymptomatic and had a low-risk nuclear study in 2012. He has preserved left ventricular systolic function.  He received a stent to the left subclavian artery for subclavian steal syndrome in 2001. He is now asymptomatic from the standpoint and his most recent ultrasound shows moderate bilateral subclavian artery stenosis.  He received a stent to the left renal artery in 2005. His most recent ultrasound shows mild bilateral renal artery stenosis, less than 60%.  He had a transient ischemic attack in 2013 and underwent a right carotid endarterectomy.  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  He is intolerant to statins including both atorvastatin and rosuvastatin.   Past Medical History  Diagnosis Date  .  Hyperlipidemia   . Hypertension   . Pneumonia     as a child  . Pacemaker   . Arthritis   . Coronary artery disease     prox and mid RCA atherectomy '01  . Carotid artery occlusion     left SCA stent '01, carotid  occlusive dz    Past Surgical History  Procedure Laterality Date  . Kidney stone surgery    . Cardiac catheterization    . Coronary angioplasty    . Kidney stent      left RAS '05  . Tonsillectomy    . Back surgery      approx 20 years ago  . Eye surgery      bilateral cataract removal  . Knee surgery      right knee  . Cardiac pacemaker placement      [place] '85, '95; [place] '06  . Endarterectomy  09/05/2011    Procedure: ENDARTERECTOMY CAROTID;  Surgeon: Mal Misty, MD;  Location: [facility];  Service: Vascular;  Laterality: Right;  . Carotid endarterectomy Right 09-05-11    cea  . Peripheral vascular angiogram  01/22/2003    Left subclavian 85% fairly focal in-stent restenosis, dilated with a 7x2cm Powerflex balloon at 10-30 and 10-30, resulting in less than 10% residual narrowing. Left renal artery demonstrated 80-85% initially dilated with a 4x15 mm coronary cutting balloon at 10-30 and 12-30, exchanged for a 59mmx2cm Aviator balloon inflations were done at 6-15, 10-30, and 10-30, resulting less than 10% residual.  . Peripheral vascular angiogram  07/13/1999    Left subclavian 85% dilated with a 50mmx2cm Cordis Power-Flex balloon at 6-40, a P-204 iliac stent was hand-crimped on a 63mmx2cm Cordis Power-Flex balloon, expanded at 8-40 with reduction to 0%. Left Renal artery 75-80% stenosis, dilated with a 59mmx1.5cm Cordis Power-Flex balloon at 6-40, reduction of stenosis from 80% to 0%.  . Renal doppler  09/25/2012    Celiac artery and SMA-demonstrates narrowing with increased velocities consistent with greater than 50%, right renal- 1-59% diameter reduction, left renal artery stent- 1-59% diameter reduction.  . Cardiac catheterization  03/09/1999    RCA high-grade 95% stenosis, inflated with a 3.25 IVT cutting balloon at 6-64, 6-41, 6-52, and 6-53, resulting in reductiong of less than 10%. RCA proximal 75% stenosis inflated with a 2.75x10 IVT cutting balloon at 6-65 then exchanged for a  3x10 IVT cutting balloon inflated at 6-83.  . Cardiovascular stress test  08/09/2010    Normal pattern of perfusion in all regions. No ECG changes. EKG negative for ischemia.  . Transthoracic echocardiogram  09-25-2012    EF 55-60%, mild-moderate tricuspid valve regurg  . Icd generator change  10/20/2004    Implantation of [facility] Gans, model # 5357M/s, serial # [id number]    Outpatient Prescriptions Prior to Visit  Medication Sig Dispense Refill  . acetaminophen (TYLENOL) 325 MG tablet Take 650 mg by mouth 3 (three) times daily.    . fish oil-omega-3 fatty acids 1000 MG capsule Take 1 g by mouth daily.    . rivaroxaban (XARELTO) 20 MG TABS tablet Take 1 tablet (20 mg total) by mouth daily with supper. (Patient taking differently: Take 20 mg by mouth at bedtime. ) 30 tablet 11  . cefUROXime (CEFTIN) 500 MG tablet Take 1 tablet (500 mg total) by mouth 2 (two) times daily with a meal. (Patient not taking: Reported on 02/01/2015) 6 tablet 0  . HYDROcodone-acetaminophen (NORCO/VICODIN) 5-325 MG per tablet Take  1 tablet by mouth every 6 (six) hours as needed. (Patient not taking: Reported on 02/01/2015) 15 tablet 0  . OVER THE COUNTER MEDICATION Take by mouth daily. Reported on 02/01/2015    . phenol (CHLORASEPTIC) 1.4 % LIQD Use as directed 1 spray in the mouth or throat as needed for throat irritation / pain. (Patient not taking: Reported on 02/01/2015) 1 Bottle 0  . phenylephrine (SUDAFED PE) 10 MG TABS tablet Take 20 mg by mouth every 6 (six) hours as needed. Reported on 02/01/2015    . sodium chloride (OCEAN) 0.65 % SOLN nasal spray Place 1 spray into both nostrils as needed for congestion. Reported on 02/01/2015     No facility-administered medications prior to visit.     Allergies:   Atorvastatin and Crestor   Social History   Social History  . Marital Status: Married    Spouse Name: N/A  . Number of Children: N/A  . Years of Education: N/A   Social History Main Topics  .  Smoking status: Former Smoker -- 0.50 packs/day for 25 years    Types: Cigarettes    Quit date: 08/19/1996  . Smokeless tobacco: Current User    Types: Snuff  . Alcohol Use: No  . Drug Use: No  . Sexual Activity: Not Asked   Other Topics Concern  . None   Social History Narrative     Family History:  The patient's family history includes Arthritis in his father.   ROS:   Please see the history of present illness.    ROS All other systems are reviewed and are negative.   PHYSICAL EXAM:    Vitals:   03/02/19 0939  BP: 138/78  Pulse: 89  Temp: (!) 97 F (36.1 C)  SpO2: 98%  Weight: 111 lb 3.2 oz (50.4 kg)  Height: 5\' 6"  (1.676 m)  Body mass index is 17.95 kg/m.   General: Alert, oriented x3, no distress, healthy right subclavian pacemaker site.  He remains very lean. Head: no evidence of trauma, PERRL, EOMI, no exophtalmos or lid lag, no myxedema, no xanthelasma; normal ears, nose and oropharynx Neck: normal jugular venous pulsations and no hepatojugular reflux; brisk carotid pulses without delay and no carotid bruits Chest: clear to auscultation, no signs of consolidation by percussion or palpation, normal fremitus, symmetrical and full respiratory excursions Cardiovascular: normal position and quality of the apical impulse, regular rhythm, normal first and second heart sounds, no murmurs, rubs or gallops Abdomen: no tenderness or distention, no masses by palpation, no abnormal pulsatility or arterial bruits, normal bowel sounds, no hepatosplenomegaly Extremities: no clubbing, cyanosis or edema; 2+ radial, ulnar and brachial pulses bilaterally; 2+ right femoral, posterior tibial and dorsalis pedis pulses; 2+ left femoral, posterior tibial and dorsalis pedis pulses; no subclavian or femoral bruits Neurological: grossly nonfocal Psych: Normal mood and affect   Wt Readings from Last 3 Encounters:  02/01/15 49.045 kg (108 lb 2 oz)  08/10/14 45.768 kg (100 lb 14.4 oz)    02/08/14 50.44 kg (111 lb 3.2 oz)      Studies/Labs Reviewed:   EKG: The ECG ordered today shows normal sinus rhythm with a single PVC.  Otherwise completely normal tracing.  No repolarization abnormalities.  QTc 425 ms.  BMET    Component Value Date/Time   NA 138 07/04/2018 1120   K 4.3 07/04/2018 1120   CL 109 07/04/2018 1120   CO2 19 (L) 07/04/2018 1120   GLUCOSE 176 (H) 07/04/2018 1120   BUN 23  07/04/2018 1120   CREATININE 2.22 (H) 07/04/2018 1120   CREATININE 1.60 (H) 05/30/2016 1122   CALCIUM 8.0 (L) 07/04/2018 1120   GFRNONAA 27 (L) 07/04/2018 1120   GFRNONAA 40 (L) 05/30/2016 1122   GFRAA 31 (L) 07/04/2018 1120   GFRAA 46 (L) 05/30/2016 1122   11/21/2018 Hemoglobin A1c 6.7%, hemoglobin 15, creatinine 1.7, potassium 4.7, normal liver function tests, TSH 3.100  Lipid Panel     Component Value Date/Time   CHOL 168 06/18/2012 1043   TRIG 183 (H) 06/18/2012 1043   HDL 39 (L) 06/18/2012 1043   CHOLHDL 4.3 06/18/2012 1043   VLDL 37 06/18/2012 1043   LDLCALC 92 06/18/2012 1043    07/02/2018 Total cholesterol 136, HDL 36, LDL 60, triglycerides 201  ASSESSMENT:     1. Paroxysmal atrial fibrillation (HCC)   2. SSS (sick sinus syndrome) ([place])   3. Pacemaker   4. History of right-sided carotid endarterectomy   5. Stenosis of both subclavian arteries   6. Mesenteric ischemia ([place])   7. Coronary artery disease involving native coronary artery of native heart without angina pectoris   8. Essential hypertension   9. Mixed hyperlipidemia   10. Underweight   11. CKD (chronic kidney disease) stage 4, GFR 15-29 ml/min (HCC)      PLAN:  In order of problems listed above:  1. Afib: I believe this diagnosis was based on artifactual recordings of electromechanical interference/oversensing on the unipolar atrial lead.  He is not on anticoagulation. 2. SSS/PPM: His device is functioning normally and still has at least 1.5 years of estimated longevity.  He still has his  original 5.6 mm unipolar leads from [place] and there are no longer pacemaker generators being manufactured that can be attached to the leads.  Thankfully he reports very little pacing.  He is not a candidate for lead extraction.  Discussed the options we could envision when his device reaches ERI.  I am really not sure that he needs a new pacemaker and if his dementia has progressed substantially, may decide to forego new device implantation. 3. Carotid stenosis: No focal neurological complaints..  Previous right carotid endarterectomy.   4. Bilateral subclavian artery stenosis does not have symptoms of vertebral steal syndrome or limb claudication.  Blood pressure should always be checked on the right side.Marland Kitchen  He typically has a 20 mmHg gradient between the right (higher) and left (lower) upper extremity blood pressures. 5. Celiac and SMA stenoses: Denies intestinal angina or bloody diarrhea.  He is very lean but his weight has been stable for the last 4 years.  Doubt malabsorption. 6. CAD: Denies angina pectoris.  He is very sedentary.  Last functional study was in 2012. 7. HTN: Blood pressure checked in his right arm is normal.  He has stenoses in all major limb vessel so is hard to say what is true central blood pressure is. 8. HLP: Despite being markedly underweight he still has slight elevation in triglycerides and the hemoglobin A1c consistent with diabetes mellitus (albeit adequately controlled). 9. Underweight: He is underweight and his most recent albumin last July was in the 3.1-3.6 range.  He has not lost any additional weight since then. 10. CKD 4: GFR approx 25-35, improved since last July.  Most recent creatinine 1.7.  Patient Instructions  Medication Instructions:  No changes If you need a refill on your cardiac medications before your next appointment, please call your pharmacy.   Lab work: None ordered If you have labs (blood work)  drawn today and your tests are completely normal, you  will receive your results only by: MyChart Message (if you have MyChart) OR A paper copy in the mail If you have any lab test that is abnormal or we need to change your treatment, we will call you to review the results.  Testing/Procedures: None ordered  Follow-Up: At [facility], you and your health needs are our priority.  As part of our continuing mission to provide you with exceptional heart care, we have created designated Provider Care Teams.  These Care Teams include your primary Cardiologist (physician) and Advanced Practice Providers (APPs -  Physician Assistants and Nurse Practitioners) who all work together to provide you with the care you need, when you need it.  We recommend signing up for the patient portal called "MyChart".  Sign up information is provided on this After Visit Summary.  MyChart is used to connect with patients for Virtual Visits (Telemedicine).  Patients are able to view lab/test results, encounter notes, upcoming appointments, etc.  Non-urgent messages can be sent to your provider as well.   To learn more about what you can do with MyChart, go to [email or web address].    Your next appointment:   12 month(s)  The format for your next appointment:   In Person  Provider:   Sanda Klein, MD      Mikael Spray, MD  02/01/2015 4:48 PM    [facility] [address], [place], [place]  [address] Phone: [phone]; Fax: [phone]

## 2019-03-02 NOTE — Patient Instructions (Signed)
Medication Instructions:  No changes If you need a refill on your cardiac medications before your next appointment, please call your pharmacy.   Lab work: None ordered If you have labs (blood work) drawn today and your tests are completely normal, you will receive your results only by: [place] (if you have MyChart) OR A paper copy in the mail If you have any lab test that is abnormal or we need to change your treatment, we will call you to review the results.  Testing/Procedures: None ordered  Follow-Up: At [facility], you and your health needs are our priority.  As part of our continuing mission to provide you with exceptional heart care, we have created designated Provider Care Teams.  These Care Teams include your primary Cardiologist (physician) and Advanced Practice Providers (APPs -  Physician Assistants and Nurse Practitioners) who all work together to provide you with the care you need, when you need it.  We recommend signing up for the patient portal called "MyChart".  Sign up information is provided on this After Visit Summary.  MyChart is used to connect with patients for Virtual Visits (Telemedicine).  Patients are able to view lab/test results, encounter notes, upcoming appointments, etc.  Non-urgent messages can be sent to your provider as well.   To learn more about what you can do with MyChart, go to [email or web address].    Your next appointment:   12 month(s)  The format for your next appointment:   In Person  Provider:   Sanda Klein, MD

## 2019-03-06 ENCOUNTER — Other Ambulatory Visit (INDEPENDENT_AMBULATORY_CARE_PROVIDER_SITE_OTHER): Payer: PPO

## 2019-03-06 DIAGNOSIS — I10 Essential (primary) hypertension: Secondary | ICD-10-CM

## 2019-04-01 ENCOUNTER — Other Ambulatory Visit: Payer: Self-pay | Admitting: Cardiovascular Disease

## 2019-04-16 ENCOUNTER — Other Ambulatory Visit: Payer: Self-pay | Admitting: Cardiovascular Disease

## 2019-05-21 DIAGNOSIS — Z681 Body mass index (BMI) 19 or less, adult: Secondary | ICD-10-CM | POA: Diagnosis not present

## 2019-05-21 DIAGNOSIS — D509 Iron deficiency anemia, unspecified: Secondary | ICD-10-CM | POA: Diagnosis not present

## 2019-05-21 DIAGNOSIS — N183 Chronic kidney disease, stage 3 unspecified: Secondary | ICD-10-CM | POA: Diagnosis not present

## 2019-05-21 DIAGNOSIS — I480 Paroxysmal atrial fibrillation: Secondary | ICD-10-CM | POA: Diagnosis not present

## 2019-05-21 DIAGNOSIS — F0390 Unspecified dementia without behavioral disturbance: Secondary | ICD-10-CM | POA: Diagnosis not present

## 2019-05-21 DIAGNOSIS — I2510 Atherosclerotic heart disease of native coronary artery without angina pectoris: Secondary | ICD-10-CM | POA: Diagnosis not present

## 2019-05-21 DIAGNOSIS — E119 Type 2 diabetes mellitus without complications: Secondary | ICD-10-CM | POA: Diagnosis not present

## 2019-08-21 DIAGNOSIS — Z139 Encounter for screening, unspecified: Secondary | ICD-10-CM | POA: Diagnosis not present

## 2019-08-21 DIAGNOSIS — D509 Iron deficiency anemia, unspecified: Secondary | ICD-10-CM | POA: Diagnosis not present

## 2019-08-21 DIAGNOSIS — E119 Type 2 diabetes mellitus without complications: Secondary | ICD-10-CM | POA: Diagnosis not present

## 2019-08-21 DIAGNOSIS — F0390 Unspecified dementia without behavioral disturbance: Secondary | ICD-10-CM | POA: Diagnosis not present

## 2019-08-21 DIAGNOSIS — N183 Chronic kidney disease, stage 3 unspecified: Secondary | ICD-10-CM | POA: Diagnosis not present

## 2019-08-21 DIAGNOSIS — I2510 Atherosclerotic heart disease of native coronary artery without angina pectoris: Secondary | ICD-10-CM | POA: Diagnosis not present

## 2019-08-21 DIAGNOSIS — Z681 Body mass index (BMI) 19 or less, adult: Secondary | ICD-10-CM | POA: Diagnosis not present

## 2019-08-21 DIAGNOSIS — I480 Paroxysmal atrial fibrillation: Secondary | ICD-10-CM | POA: Diagnosis not present

## 2019-10-29 DIAGNOSIS — Z23 Encounter for immunization: Secondary | ICD-10-CM | POA: Diagnosis not present

## 2019-11-16 DIAGNOSIS — R509 Fever, unspecified: Secondary | ICD-10-CM | POA: Diagnosis not present

## 2019-11-16 DIAGNOSIS — G309 Alzheimer's disease, unspecified: Secondary | ICD-10-CM | POA: Diagnosis not present

## 2019-11-16 DIAGNOSIS — E1122 Type 2 diabetes mellitus with diabetic chronic kidney disease: Secondary | ICD-10-CM | POA: Diagnosis not present

## 2019-11-16 DIAGNOSIS — K5792 Diverticulitis of intestine, part unspecified, without perforation or abscess without bleeding: Secondary | ICD-10-CM | POA: Diagnosis not present

## 2019-11-16 DIAGNOSIS — R4182 Altered mental status, unspecified: Secondary | ICD-10-CM | POA: Diagnosis not present

## 2019-11-16 DIAGNOSIS — N183 Chronic kidney disease, stage 3 unspecified: Secondary | ICD-10-CM | POA: Diagnosis not present

## 2019-11-16 DIAGNOSIS — I129 Hypertensive chronic kidney disease with stage 1 through stage 4 chronic kidney disease, or unspecified chronic kidney disease: Secondary | ICD-10-CM | POA: Diagnosis not present

## 2019-11-16 DIAGNOSIS — R0902 Hypoxemia: Secondary | ICD-10-CM | POA: Diagnosis not present

## 2019-11-16 DIAGNOSIS — K5730 Diverticulosis of large intestine without perforation or abscess without bleeding: Secondary | ICD-10-CM | POA: Diagnosis not present

## 2019-11-16 DIAGNOSIS — E639 Nutritional deficiency, unspecified: Secondary | ICD-10-CM | POA: Diagnosis not present

## 2019-11-16 DIAGNOSIS — G301 Alzheimer's disease with late onset: Secondary | ICD-10-CM | POA: Diagnosis not present

## 2019-11-16 DIAGNOSIS — Z20822 Contact with and (suspected) exposure to covid-19: Secondary | ICD-10-CM | POA: Diagnosis not present

## 2019-11-16 DIAGNOSIS — F0390 Unspecified dementia without behavioral disturbance: Secondary | ICD-10-CM | POA: Diagnosis not present

## 2019-11-16 DIAGNOSIS — Z794 Long term (current) use of insulin: Secondary | ICD-10-CM | POA: Diagnosis not present

## 2019-11-16 DIAGNOSIS — I959 Hypotension, unspecified: Secondary | ICD-10-CM | POA: Diagnosis not present

## 2019-11-16 DIAGNOSIS — Z87891 Personal history of nicotine dependence: Secondary | ICD-10-CM | POA: Diagnosis not present

## 2019-11-16 DIAGNOSIS — F0280 Dementia in other diseases classified elsewhere without behavioral disturbance: Secondary | ICD-10-CM | POA: Diagnosis not present

## 2019-11-16 DIAGNOSIS — D72829 Elevated white blood cell count, unspecified: Secondary | ICD-10-CM | POA: Diagnosis not present

## 2019-11-16 DIAGNOSIS — G928 Other toxic encephalopathy: Secondary | ICD-10-CM | POA: Diagnosis not present

## 2019-11-16 DIAGNOSIS — Z792 Long term (current) use of antibiotics: Secondary | ICD-10-CM | POA: Diagnosis not present

## 2019-11-16 DIAGNOSIS — E1165 Type 2 diabetes mellitus with hyperglycemia: Secondary | ICD-10-CM | POA: Diagnosis not present

## 2019-11-16 DIAGNOSIS — K5732 Diverticulitis of large intestine without perforation or abscess without bleeding: Secondary | ICD-10-CM | POA: Diagnosis not present

## 2019-11-16 DIAGNOSIS — E1159 Type 2 diabetes mellitus with other circulatory complications: Secondary | ICD-10-CM | POA: Diagnosis not present

## 2019-11-16 DIAGNOSIS — R456 Violent behavior: Secondary | ICD-10-CM | POA: Diagnosis not present

## 2019-11-16 DIAGNOSIS — R7881 Bacteremia: Secondary | ICD-10-CM | POA: Diagnosis not present

## 2019-11-16 DIAGNOSIS — G929 Unspecified toxic encephalopathy: Secondary | ICD-10-CM | POA: Diagnosis not present

## 2019-11-16 DIAGNOSIS — Z8679 Personal history of other diseases of the circulatory system: Secondary | ICD-10-CM | POA: Diagnosis not present

## 2019-11-16 DIAGNOSIS — Z79899 Other long term (current) drug therapy: Secondary | ICD-10-CM | POA: Diagnosis not present

## 2019-11-16 DIAGNOSIS — G9341 Metabolic encephalopathy: Secondary | ICD-10-CM | POA: Diagnosis not present

## 2019-11-16 DIAGNOSIS — A419 Sepsis, unspecified organism: Secondary | ICD-10-CM | POA: Diagnosis not present

## 2019-11-16 DIAGNOSIS — J9811 Atelectasis: Secondary | ICD-10-CM | POA: Diagnosis not present

## 2019-11-16 DIAGNOSIS — F0281 Dementia in other diseases classified elsewhere with behavioral disturbance: Secondary | ICD-10-CM | POA: Diagnosis not present

## 2019-11-16 DIAGNOSIS — I700 Atherosclerosis of aorta: Secondary | ICD-10-CM | POA: Diagnosis not present

## 2019-11-16 DIAGNOSIS — I2510 Atherosclerotic heart disease of native coronary artery without angina pectoris: Secondary | ICD-10-CM | POA: Diagnosis not present

## 2019-11-16 DIAGNOSIS — Z66 Do not resuscitate: Secondary | ICD-10-CM | POA: Diagnosis not present

## 2019-11-24 DIAGNOSIS — Z7689 Persons encountering health services in other specified circumstances: Secondary | ICD-10-CM | POA: Diagnosis not present

## 2019-11-24 DIAGNOSIS — K5732 Diverticulitis of large intestine without perforation or abscess without bleeding: Secondary | ICD-10-CM | POA: Diagnosis not present

## 2019-11-24 DIAGNOSIS — K529 Noninfective gastroenteritis and colitis, unspecified: Secondary | ICD-10-CM | POA: Diagnosis not present

## 2019-11-24 DIAGNOSIS — I700 Atherosclerosis of aorta: Secondary | ICD-10-CM | POA: Diagnosis not present

## 2019-11-24 DIAGNOSIS — I739 Peripheral vascular disease, unspecified: Secondary | ICD-10-CM | POA: Diagnosis not present

## 2019-11-24 DIAGNOSIS — Z681 Body mass index (BMI) 19 or less, adult: Secondary | ICD-10-CM | POA: Diagnosis not present

## 2019-11-24 DIAGNOSIS — G319 Degenerative disease of nervous system, unspecified: Secondary | ICD-10-CM | POA: Diagnosis not present

## 2019-12-20 ENCOUNTER — Encounter (HOSPITAL_COMMUNITY)
Admission: EM | Disposition: A | Discharge status: Hospice | Payer: Self-pay | Source: Home / Self Care | Attending: Vascular Surgery

## 2019-12-20 ENCOUNTER — Emergency Department (HOSPITAL_COMMUNITY): Payer: PPO | Admitting: Anesthesiology

## 2019-12-20 ENCOUNTER — Inpatient Hospital Stay (HOSPITAL_COMMUNITY)
Admission: EM | Admit: 2019-12-20 | Discharge: 2019-12-23 | DRG: 253 | Disposition: A | Discharge status: Hospice | Payer: PPO | Attending: Vascular Surgery | Admitting: Vascular Surgery

## 2019-12-20 ENCOUNTER — Other Ambulatory Visit: Payer: Self-pay

## 2019-12-20 ENCOUNTER — Emergency Department (HOSPITAL_COMMUNITY): Payer: PPO

## 2019-12-20 ENCOUNTER — Encounter (HOSPITAL_COMMUNITY): Payer: Self-pay

## 2019-12-20 DIAGNOSIS — R0902 Hypoxemia: Secondary | ICD-10-CM | POA: Diagnosis not present

## 2019-12-20 DIAGNOSIS — Z888 Allergy status to other drugs, medicaments and biological substances status: Secondary | ICD-10-CM

## 2019-12-20 DIAGNOSIS — E1165 Type 2 diabetes mellitus with hyperglycemia: Secondary | ICD-10-CM | POA: Diagnosis present

## 2019-12-20 DIAGNOSIS — Z66 Do not resuscitate: Secondary | ICD-10-CM | POA: Diagnosis present

## 2019-12-20 DIAGNOSIS — I743 Embolism and thrombosis of arteries of the lower extremities: Secondary | ICD-10-CM | POA: Diagnosis present

## 2019-12-20 DIAGNOSIS — K219 Gastro-esophageal reflux disease without esophagitis: Secondary | ICD-10-CM | POA: Diagnosis not present

## 2019-12-20 DIAGNOSIS — R54 Age-related physical debility: Secondary | ICD-10-CM | POA: Diagnosis present

## 2019-12-20 DIAGNOSIS — R404 Transient alteration of awareness: Secondary | ICD-10-CM | POA: Diagnosis not present

## 2019-12-20 DIAGNOSIS — R531 Weakness: Secondary | ICD-10-CM | POA: Diagnosis not present

## 2019-12-20 DIAGNOSIS — I2510 Atherosclerotic heart disease of native coronary artery without angina pectoris: Secondary | ICD-10-CM | POA: Diagnosis present

## 2019-12-20 DIAGNOSIS — Z79899 Other long term (current) drug therapy: Secondary | ICD-10-CM

## 2019-12-20 DIAGNOSIS — E785 Hyperlipidemia, unspecified: Secondary | ICD-10-CM | POA: Diagnosis present

## 2019-12-20 DIAGNOSIS — J069 Acute upper respiratory infection, unspecified: Secondary | ICD-10-CM | POA: Diagnosis not present

## 2019-12-20 DIAGNOSIS — N179 Acute kidney failure, unspecified: Secondary | ICD-10-CM | POA: Diagnosis present

## 2019-12-20 DIAGNOSIS — Z515 Encounter for palliative care: Secondary | ICD-10-CM

## 2019-12-20 DIAGNOSIS — F0391 Unspecified dementia with behavioral disturbance: Secondary | ICD-10-CM

## 2019-12-20 DIAGNOSIS — M2550 Pain in unspecified joint: Secondary | ICD-10-CM | POA: Diagnosis not present

## 2019-12-20 DIAGNOSIS — D62 Acute posthemorrhagic anemia: Secondary | ICD-10-CM | POA: Diagnosis not present

## 2019-12-20 DIAGNOSIS — I70229 Atherosclerosis of native arteries of extremities with rest pain, unspecified extremity: Secondary | ICD-10-CM

## 2019-12-20 DIAGNOSIS — Z8249 Family history of ischemic heart disease and other diseases of the circulatory system: Secondary | ICD-10-CM

## 2019-12-20 DIAGNOSIS — I70222 Atherosclerosis of native arteries of extremities with rest pain, left leg: Principal | ICD-10-CM | POA: Diagnosis present

## 2019-12-20 DIAGNOSIS — R410 Disorientation, unspecified: Secondary | ICD-10-CM | POA: Diagnosis not present

## 2019-12-20 DIAGNOSIS — Z20822 Contact with and (suspected) exposure to covid-19: Secondary | ICD-10-CM | POA: Diagnosis present

## 2019-12-20 DIAGNOSIS — Z95810 Presence of automatic (implantable) cardiac defibrillator: Secondary | ICD-10-CM

## 2019-12-20 DIAGNOSIS — M1990 Unspecified osteoarthritis, unspecified site: Secondary | ICD-10-CM | POA: Diagnosis present

## 2019-12-20 DIAGNOSIS — E861 Hypovolemia: Secondary | ICD-10-CM | POA: Diagnosis present

## 2019-12-20 DIAGNOSIS — Z95820 Peripheral vascular angioplasty status with implants and grafts: Secondary | ICD-10-CM

## 2019-12-20 DIAGNOSIS — F0151 Vascular dementia with behavioral disturbance: Secondary | ICD-10-CM | POA: Diagnosis not present

## 2019-12-20 DIAGNOSIS — R059 Cough, unspecified: Secondary | ICD-10-CM | POA: Diagnosis not present

## 2019-12-20 DIAGNOSIS — I129 Hypertensive chronic kidney disease with stage 1 through stage 4 chronic kidney disease, or unspecified chronic kidney disease: Secondary | ICD-10-CM | POA: Diagnosis present

## 2019-12-20 DIAGNOSIS — I70245 Atherosclerosis of native arteries of left leg with ulceration of other part of foot: Secondary | ICD-10-CM | POA: Diagnosis not present

## 2019-12-20 DIAGNOSIS — R52 Pain, unspecified: Secondary | ICD-10-CM | POA: Diagnosis not present

## 2019-12-20 DIAGNOSIS — R5381 Other malaise: Secondary | ICD-10-CM | POA: Diagnosis not present

## 2019-12-20 DIAGNOSIS — T82856A Stenosis of peripheral vascular stent, initial encounter: Secondary | ICD-10-CM | POA: Diagnosis present

## 2019-12-20 DIAGNOSIS — G9340 Encephalopathy, unspecified: Secondary | ICD-10-CM | POA: Diagnosis not present

## 2019-12-20 DIAGNOSIS — I959 Hypotension, unspecified: Secondary | ICD-10-CM | POA: Diagnosis not present

## 2019-12-20 DIAGNOSIS — Z7902 Long term (current) use of antithrombotics/antiplatelets: Secondary | ICD-10-CM

## 2019-12-20 DIAGNOSIS — R3911 Hesitancy of micturition: Secondary | ICD-10-CM | POA: Diagnosis present

## 2019-12-20 DIAGNOSIS — I4891 Unspecified atrial fibrillation: Secondary | ICD-10-CM | POA: Diagnosis not present

## 2019-12-20 DIAGNOSIS — Z9889 Other specified postprocedural states: Secondary | ICD-10-CM

## 2019-12-20 DIAGNOSIS — I739 Peripheral vascular disease, unspecified: Secondary | ICD-10-CM

## 2019-12-20 DIAGNOSIS — R011 Cardiac murmur, unspecified: Secondary | ICD-10-CM | POA: Diagnosis not present

## 2019-12-20 DIAGNOSIS — Z720 Tobacco use: Secondary | ICD-10-CM

## 2019-12-20 DIAGNOSIS — E1151 Type 2 diabetes mellitus with diabetic peripheral angiopathy without gangrene: Secondary | ICD-10-CM | POA: Diagnosis present

## 2019-12-20 DIAGNOSIS — E1122 Type 2 diabetes mellitus with diabetic chronic kidney disease: Secondary | ICD-10-CM | POA: Diagnosis not present

## 2019-12-20 DIAGNOSIS — G8929 Other chronic pain: Secondary | ICD-10-CM | POA: Diagnosis present

## 2019-12-20 DIAGNOSIS — I340 Nonrheumatic mitral (valve) insufficiency: Secondary | ICD-10-CM | POA: Diagnosis not present

## 2019-12-20 DIAGNOSIS — M545 Low back pain, unspecified: Secondary | ICD-10-CM | POA: Diagnosis present

## 2019-12-20 DIAGNOSIS — E872 Acidosis: Secondary | ICD-10-CM | POA: Diagnosis present

## 2019-12-20 DIAGNOSIS — Z9861 Coronary angioplasty status: Secondary | ICD-10-CM

## 2019-12-20 DIAGNOSIS — G459 Transient cerebral ischemic attack, unspecified: Secondary | ICD-10-CM | POA: Diagnosis not present

## 2019-12-20 DIAGNOSIS — R4182 Altered mental status, unspecified: Secondary | ICD-10-CM | POA: Diagnosis not present

## 2019-12-20 DIAGNOSIS — R579 Shock, unspecified: Secondary | ICD-10-CM | POA: Diagnosis not present

## 2019-12-20 DIAGNOSIS — N184 Chronic kidney disease, stage 4 (severe): Secondary | ICD-10-CM | POA: Diagnosis present

## 2019-12-20 DIAGNOSIS — Z7401 Bed confinement status: Secondary | ICD-10-CM | POA: Diagnosis not present

## 2019-12-20 DIAGNOSIS — I998 Other disorder of circulatory system: Secondary | ICD-10-CM

## 2019-12-20 DIAGNOSIS — I499 Cardiac arrhythmia, unspecified: Secondary | ICD-10-CM | POA: Diagnosis not present

## 2019-12-20 DIAGNOSIS — D500 Iron deficiency anemia secondary to blood loss (chronic): Secondary | ICD-10-CM | POA: Diagnosis not present

## 2019-12-20 DIAGNOSIS — Z8261 Family history of arthritis: Secondary | ICD-10-CM

## 2019-12-20 DIAGNOSIS — E876 Hypokalemia: Secondary | ICD-10-CM | POA: Diagnosis not present

## 2019-12-20 DIAGNOSIS — I361 Nonrheumatic tricuspid (valve) insufficiency: Secondary | ICD-10-CM | POA: Diagnosis not present

## 2019-12-20 DIAGNOSIS — I480 Paroxysmal atrial fibrillation: Secondary | ICD-10-CM | POA: Diagnosis not present

## 2019-12-20 DIAGNOSIS — I495 Sick sinus syndrome: Secondary | ICD-10-CM | POA: Diagnosis present

## 2019-12-20 HISTORY — PX: ENDARTERECTOMY POPLITEAL: SHX5806

## 2019-12-20 HISTORY — PX: THROMBECTOMY OF BYPASS GRAFT FEMORAL- PERONEAL ARTERY: SHX6903

## 2019-12-20 HISTORY — PX: LOWER EXTREMITY ANGIOGRAM: SHX5508

## 2019-12-20 HISTORY — PX: FEMORAL-POPLITEAL BYPASS GRAFT: SHX937

## 2019-12-20 LAB — I-STAT CHEM 8, ED
BUN: 20 mg/dL (ref 8–23)
Calcium, Ion: 1.11 mmol/L — ABNORMAL LOW (ref 1.15–1.40)
Chloride: 101 mmol/L (ref 98–111)
Creatinine, Ser: 1.8 mg/dL — ABNORMAL HIGH (ref 0.61–1.24)
Glucose, Bld: 223 mg/dL — ABNORMAL HIGH (ref 70–99)
HCT: 42 % (ref 39.0–52.0)
Hemoglobin: 14.3 g/dL (ref 13.0–17.0)
Potassium: 4.3 mmol/L (ref 3.5–5.1)
Sodium: 138 mmol/L (ref 135–145)
TCO2: 24 mmol/L (ref 22–32)

## 2019-12-20 LAB — URINALYSIS, ROUTINE W REFLEX MICROSCOPIC
Bacteria, UA: NONE SEEN
Bilirubin Urine: NEGATIVE
Glucose, UA: NEGATIVE mg/dL
Hgb urine dipstick: NEGATIVE
Ketones, ur: 5 mg/dL — AB
Leukocytes,Ua: NEGATIVE
Nitrite: NEGATIVE
Protein, ur: 30 mg/dL — AB
Specific Gravity, Urine: 1.016 (ref 1.005–1.030)
pH: 6 (ref 5.0–8.0)

## 2019-12-20 LAB — CK: Total CK: 45 U/L — ABNORMAL LOW (ref 49–397)

## 2019-12-20 LAB — DIFFERENTIAL
Abs Immature Granulocytes: 0.07 10*3/uL (ref 0.00–0.07)
Basophils Absolute: 0.1 10*3/uL (ref 0.0–0.1)
Basophils Relative: 1 %
Eosinophils Absolute: 0.1 10*3/uL (ref 0.0–0.5)
Eosinophils Relative: 1 %
Immature Granulocytes: 1 %
Lymphocytes Relative: 7 %
Lymphs Abs: 1 10*3/uL (ref 0.7–4.0)
Monocytes Absolute: 0.6 10*3/uL (ref 0.1–1.0)
Monocytes Relative: 4 %
Neutro Abs: 11.5 10*3/uL — ABNORMAL HIGH (ref 1.7–7.7)
Neutrophils Relative %: 86 %

## 2019-12-20 LAB — COMPREHENSIVE METABOLIC PANEL
ALT: 15 U/L (ref 0–44)
AST: 19 U/L (ref 15–41)
Albumin: 3.3 g/dL — ABNORMAL LOW (ref 3.5–5.0)
Alkaline Phosphatase: 79 U/L (ref 38–126)
Anion gap: 14 (ref 5–15)
BUN: 17 mg/dL (ref 8–23)
CO2: 25 mmol/L (ref 22–32)
Calcium: 8.9 mg/dL (ref 8.9–10.3)
Chloride: 101 mmol/L (ref 98–111)
Creatinine, Ser: 1.98 mg/dL — ABNORMAL HIGH (ref 0.61–1.24)
GFR, Estimated: 33 mL/min — ABNORMAL LOW (ref >60)
Glucose, Bld: 232 mg/dL — ABNORMAL HIGH (ref 70–99)
Potassium: 4.5 mmol/L (ref 3.5–5.1)
Sodium: 140 mmol/L (ref 135–145)
Total Bilirubin: 0.3 mg/dL (ref 0.3–1.2)
Total Protein: 6.7 g/dL (ref 6.5–8.1)

## 2019-12-20 LAB — RESP PANEL BY RT-PCR (FLU A&B, COVID) ARPGX2
Influenza A by PCR: NEGATIVE
Influenza B by PCR: NEGATIVE
SARS Coronavirus 2 by RT PCR: NEGATIVE

## 2019-12-20 LAB — APTT: aPTT: 28 seconds (ref 24–36)

## 2019-12-20 LAB — CBC
HCT: 42.6 % (ref 39.0–52.0)
Hemoglobin: 13.4 g/dL (ref 13.0–17.0)
MCH: 27.6 pg (ref 26.0–34.0)
MCHC: 31.5 g/dL (ref 30.0–36.0)
MCV: 87.7 fL (ref 80.0–100.0)
Platelets: 349 10*3/uL (ref 150–400)
RBC: 4.86 MIL/uL (ref 4.22–5.81)
RDW: 13.2 % (ref 11.5–15.5)
WBC: 13.3 10*3/uL — ABNORMAL HIGH (ref 4.0–10.5)
nRBC: 0 % (ref 0.0–0.2)

## 2019-12-20 LAB — PROTIME-INR
INR: 1 (ref 0.8–1.2)
Prothrombin Time: 12.8 seconds (ref 11.4–15.2)

## 2019-12-20 LAB — LACTIC ACID, PLASMA: Lactic Acid, Venous: 2.6 mmol/L (ref 0.5–1.9)

## 2019-12-20 SURGERY — THROMBECTOMY OF BYPASS GRAFT FEMORAL-PERONEAL ARTERY
Anesthesia: General | Site: Leg Lower | Laterality: Left

## 2019-12-20 MED ORDER — PHENYLEPHRINE HCL-NACL 10-0.9 MG/250ML-% IV SOLN
INTRAVENOUS | Status: DC | PRN
  Administered 2019-12-20: 50 ug/min via INTRAVENOUS

## 2019-12-20 MED ORDER — ESMOLOL HCL 100 MG/10ML IV SOLN
INTRAVENOUS | Status: DC | PRN
  Administered 2019-12-20: 10 mg via INTRAVENOUS
  Administered 2019-12-20 (×2): 5 mg via INTRAVENOUS
  Administered 2019-12-21: 10 mg via INTRAVENOUS

## 2019-12-20 MED ORDER — ROCURONIUM BROMIDE 10 MG/ML (PF) SYRINGE
PREFILLED_SYRINGE | INTRAVENOUS | Status: DC | PRN
  Administered 2019-12-20: 10 mg via INTRAVENOUS
  Administered 2019-12-20: 40 mg via INTRAVENOUS

## 2019-12-20 MED ORDER — ROCURONIUM BROMIDE 10 MG/ML (PF) SYRINGE
PREFILLED_SYRINGE | INTRAVENOUS | Status: AC
  Filled 2019-12-20: qty 10

## 2019-12-20 MED ORDER — SODIUM CHLORIDE 0.9% FLUSH: 3.0000 mL | Freq: Once | INTRAVENOUS | Status: DC

## 2019-12-20 MED ORDER — PROPOFOL 10 MG/ML IV BOLUS
INTRAVENOUS | Status: AC
  Filled 2019-12-20: qty 20

## 2019-12-20 MED ORDER — LACTATED RINGERS IV SOLN: INTRAVENOUS | Status: DC | PRN

## 2019-12-20 MED ORDER — 0.9 % SODIUM CHLORIDE (POUR BTL) OPTIME
TOPICAL | Status: DC | PRN
  Administered 2019-12-20 (×2): 1000 mL

## 2019-12-20 MED ORDER — HEPARIN SODIUM (PORCINE) 1000 UNIT/ML IJ SOLN
INTRAMUSCULAR | Status: DC | PRN
  Administered 2019-12-20: 6000 [IU] via INTRAVENOUS
  Administered 2019-12-20: 2000 [IU] via INTRAVENOUS
  Administered 2019-12-20: 6000 [IU] via INTRAVENOUS
  Administered 2019-12-20: 3000 [IU] via INTRAVENOUS

## 2019-12-20 MED ORDER — SODIUM CHLORIDE 0.9 % IV SOLN
INTRAVENOUS | Status: AC
  Filled 2019-12-20: qty 1.2

## 2019-12-20 MED ORDER — LACTATED RINGERS IV BOLUS
500.0000 mL | Freq: Once | INTRAVENOUS | Status: AC
  Administered 2019-12-20: 19:00:00 500 mL via INTRAVENOUS

## 2019-12-20 MED ORDER — HEMOSTATIC AGENTS (NO CHARGE) OPTIME
TOPICAL | Status: DC | PRN
  Administered 2019-12-20: 2 via TOPICAL

## 2019-12-20 MED ORDER — SODIUM CHLORIDE 0.9% IV SOLUTION: Freq: Once | INTRAVENOUS | Status: DC

## 2019-12-20 MED ORDER — PROPOFOL 10 MG/ML IV BOLUS
INTRAVENOUS | Status: DC | PRN
  Administered 2019-12-20: 20 mg via INTRAVENOUS
  Administered 2019-12-20: 70 mg via INTRAVENOUS

## 2019-12-20 MED ORDER — PHENYLEPHRINE HCL (PRESSORS) 10 MG/ML IV SOLN
INTRAVENOUS | Status: DC | PRN
  Administered 2019-12-20 (×4): 40 ug via INTRAVENOUS

## 2019-12-20 MED ORDER — HEPARIN SODIUM (PORCINE) 1000 UNIT/ML IJ SOLN
INTRAMUSCULAR | Status: AC
  Filled 2019-12-20: qty 1

## 2019-12-20 MED ORDER — FENTANYL CITRATE (PF) 100 MCG/2ML IJ SOLN
INTRAMUSCULAR | Status: DC | PRN
  Administered 2019-12-20 (×4): 50 ug via INTRAVENOUS
  Administered 2019-12-21 (×2): 25 ug via INTRAVENOUS

## 2019-12-20 MED ORDER — DEXAMETHASONE SODIUM PHOSPHATE 10 MG/ML IJ SOLN
INTRAMUSCULAR | Status: AC
  Filled 2019-12-20: qty 1

## 2019-12-20 MED ORDER — ONDANSETRON HCL 4 MG/2ML IJ SOLN
INTRAMUSCULAR | Status: AC
  Filled 2019-12-20: qty 2

## 2019-12-20 MED ORDER — LIDOCAINE 2% (20 MG/ML) 5 ML SYRINGE
INTRAMUSCULAR | Status: DC | PRN
  Administered 2019-12-20: 40 mg via INTRAVENOUS

## 2019-12-20 MED ORDER — CEFAZOLIN SODIUM-DEXTROSE 2-3 GM-%(50ML) IV SOLR
INTRAVENOUS | Status: DC | PRN
  Administered 2019-12-20 – 2019-12-21 (×2): 2 g via INTRAVENOUS

## 2019-12-20 MED ORDER — SODIUM CHLORIDE 0.9 % IV SOLN: INTRAVENOUS | Status: DC | PRN

## 2019-12-20 MED ORDER — SODIUM CHLORIDE 0.9 % IV BOLUS
500.0000 mL | Freq: Once | INTRAVENOUS | Status: AC
  Administered 2019-12-20: 16:00:00 500 mL via INTRAVENOUS

## 2019-12-20 MED ORDER — CEFAZOLIN SODIUM-DEXTROSE 2-4 GM/100ML-% IV SOLN
INTRAVENOUS | Status: AC
  Filled 2019-12-20: qty 100

## 2019-12-20 MED ORDER — ONDANSETRON HCL 4 MG/2ML IJ SOLN
INTRAMUSCULAR | Status: DC | PRN
  Administered 2019-12-20: 4 mg via INTRAVENOUS

## 2019-12-20 MED ORDER — FENTANYL CITRATE (PF) 250 MCG/5ML IJ SOLN
INTRAMUSCULAR | Status: AC
  Filled 2019-12-20: qty 5

## 2019-12-20 MED ORDER — ALBUMIN HUMAN 5 % IV SOLN: INTRAVENOUS | Status: DC | PRN

## 2019-12-20 MED ORDER — PHENYLEPHRINE 40 MCG/ML (10ML) SYRINGE FOR IV PUSH (FOR BLOOD PRESSURE SUPPORT)
PREFILLED_SYRINGE | INTRAVENOUS | Status: AC
  Filled 2019-12-20: qty 10

## 2019-12-20 MED ORDER — LIDOCAINE 2% (20 MG/ML) 5 ML SYRINGE
INTRAMUSCULAR | Status: AC
  Filled 2019-12-20: qty 5

## 2019-12-20 MED ORDER — PROTAMINE SULFATE 10 MG/ML IV SOLN
INTRAVENOUS | Status: AC
  Filled 2019-12-20: qty 10

## 2019-12-20 MED ORDER — PROTAMINE SULFATE 10 MG/ML IV SOLN
INTRAVENOUS | Status: DC | PRN
  Administered 2019-12-20 (×10): 10 mg via INTRAVENOUS

## 2019-12-20 MED ORDER — LACTATED RINGERS IV SOLN: INTRAVENOUS | Status: DC

## 2019-12-20 MED ORDER — DEXAMETHASONE SODIUM PHOSPHATE 10 MG/ML IJ SOLN
INTRAMUSCULAR | Status: DC | PRN
  Administered 2019-12-20: 4 mg via INTRAVENOUS

## 2019-12-20 SURGICAL SUPPLY — 83 items
ADH SKN CLS APL DERMABOND .7 (GAUZE/BANDAGES/DRESSINGS) ×2
AGENT HMST SPONGE THK3/8 (HEMOSTASIS)
BAG SNAP BAND KOVER 36X36 (MISCELLANEOUS) ×2 IMPLANT
BANDAGE ESMARK 6X9 LF (GAUZE/BANDAGES/DRESSINGS) IMPLANT
BLADE CLIPPER SURG (BLADE) ×3 IMPLANT
BNDG CMPR 9X6 STRL LF SNTH (GAUZE/BANDAGES/DRESSINGS)
BNDG ESMARK 6X9 LF (GAUZE/BANDAGES/DRESSINGS)
CANISTER SUCT 3000ML PPV (MISCELLANEOUS) ×3 IMPLANT
CATH BEACON 5 .035 65 KMP TIP (CATHETERS) ×2 IMPLANT
CATH EMB 2FR 60CM (CATHETERS) ×4 IMPLANT
CATH EMB 3FR 80CM (CATHETERS) ×4 IMPLANT
CATH EMB 4FR 80CM (CATHETERS) ×2 IMPLANT
CATH EMB 5FR 80CM (CATHETERS) IMPLANT
CATH OMNI FLUSH 5F 65CM (CATHETERS) ×2 IMPLANT
CLIP VESOCCLUDE MED 24/CT (CLIP) ×3 IMPLANT
CLIP VESOCCLUDE SM WIDE 24/CT (CLIP) ×3 IMPLANT
COVER PROBE W GEL 5X96 (DRAPES) ×1 IMPLANT
COVER WAND RF STERILE (DRAPES) ×1 IMPLANT
CUFF TOURN SGL QUICK 18X4 (TOURNIQUET CUFF) IMPLANT
CUFF TOURN SGL QUICK 24 (TOURNIQUET CUFF)
CUFF TOURN SGL QUICK 34 (TOURNIQUET CUFF)
CUFF TOURN SGL QUICK 42 (TOURNIQUET CUFF) IMPLANT
CUFF TRNQT CYL 24X4X16.5-23 (TOURNIQUET CUFF) IMPLANT
CUFF TRNQT CYL 34X4.125X (TOURNIQUET CUFF) IMPLANT
DERMABOND ADVANCED (GAUZE/BANDAGES/DRESSINGS) ×4
DERMABOND ADVANCED .7 DNX12 (GAUZE/BANDAGES/DRESSINGS) IMPLANT
DRAIN CHANNEL 15F RND FF W/TCR (WOUND CARE) IMPLANT
DRAPE C-ARM 42X72 X-RAY (DRAPES) IMPLANT
ELECT REM PT RETURN 9FT ADLT (ELECTROSURGICAL) ×3
ELECTRODE REM PT RTRN 9FT ADLT (ELECTROSURGICAL) ×1 IMPLANT
EVACUATOR SILICONE 100CC (DRAIN) IMPLANT
GAUZE 4X4 16PLY RFD (DISPOSABLE) ×2 IMPLANT
GLIDEWIRE ADV .035X180CM (WIRE) ×2 IMPLANT
GLOVE BIO SURGEON STRL SZ7.5 (GLOVE) ×3 IMPLANT
GLOVE BIOGEL PI IND STRL 8 (GLOVE) ×1 IMPLANT
GLOVE BIOGEL PI INDICATOR 8 (GLOVE) ×2
GOWN STRL REUS W/ TWL LRG LVL3 (GOWN DISPOSABLE) ×2 IMPLANT
GOWN STRL REUS W/ TWL XL LVL3 (GOWN DISPOSABLE) ×2 IMPLANT
GOWN STRL REUS W/TWL LRG LVL3 (GOWN DISPOSABLE) ×9
GOWN STRL REUS W/TWL XL LVL3 (GOWN DISPOSABLE) ×6
GRAFT PROPATEN W/RING 6X80X60 (Vascular Products) ×2 IMPLANT
HEMOSTAT SPONGE AVITENE ULTRA (HEMOSTASIS) IMPLANT
INSERT FOGARTY SM (MISCELLANEOUS) ×2 IMPLANT
KIT BASIN OR (CUSTOM PROCEDURE TRAY) ×3 IMPLANT
KIT TURNOVER KIT B (KITS) ×3 IMPLANT
LOOP VESSEL MINI RED (MISCELLANEOUS) ×4 IMPLANT
NS IRRIG 1000ML POUR BTL (IV SOLUTION) ×6 IMPLANT
PACK PERIPHERAL VASCULAR (CUSTOM PROCEDURE TRAY) ×3 IMPLANT
PAD ARMBOARD 7.5X6 YLW CONV (MISCELLANEOUS) ×6 IMPLANT
PATCH VASC XENOSURE 1CMX6CM (Vascular Products) ×3 IMPLANT
PATCH VASC XENOSURE 1X6 (Vascular Products) IMPLANT
SET MICROPUNCTURE 5F STIFF (MISCELLANEOUS) ×6 IMPLANT
SHEATH PINNACLE 5F 10CM (SHEATH) ×2 IMPLANT
STAPLER VISISTAT 35W (STAPLE) IMPLANT
STOPCOCK MORSE 400PSI 3WAY (MISCELLANEOUS) ×2 IMPLANT
SUT GORETEX 5 0 TT13 24 (SUTURE) IMPLANT
SUT GORETEX 6.0 TT13 (SUTURE) IMPLANT
SUT MNCRL AB 4-0 PS2 18 (SUTURE) ×6 IMPLANT
SUT PROLENE 5 0 C 1 24 (SUTURE) ×5 IMPLANT
SUT PROLENE 6 0 BV (SUTURE) ×29 IMPLANT
SUT PROLENE 7 0 BV 1 (SUTURE) IMPLANT
SUT SILK 2 0 PERMA HAND 18 BK (SUTURE) IMPLANT
SUT SILK 2 0 SH (SUTURE) ×3 IMPLANT
SUT SILK 2 0 SH CR/8 (SUTURE) ×2 IMPLANT
SUT SILK 3 0 (SUTURE)
SUT SILK 3-0 18XBRD TIE 12 (SUTURE) IMPLANT
SUT VIC AB 2-0 CT1 27 (SUTURE) ×6
SUT VIC AB 2-0 CT1 TAPERPNT 27 (SUTURE) ×2 IMPLANT
SUT VIC AB 3-0 SH 27 (SUTURE) ×6
SUT VIC AB 3-0 SH 27X BRD (SUTURE) ×2 IMPLANT
SYR 10ML LL (SYRINGE) ×6 IMPLANT
SYR 20ML ECCENTRIC (SYRINGE) ×2 IMPLANT
SYR 20ML LL LF (SYRINGE) ×2 IMPLANT
SYR 3ML LL SCALE MARK (SYRINGE) ×5 IMPLANT
SYR BULB IRRIG 60ML STRL (SYRINGE) ×2 IMPLANT
SYR TB 1ML LUER SLIP (SYRINGE) ×2 IMPLANT
TOWEL GREEN STERILE (TOWEL DISPOSABLE) ×5 IMPLANT
TRAY FOLEY MTR SLVR 16FR STAT (SET/KITS/TRAYS/PACK) ×3 IMPLANT
TUBING EXTENTION W/L.L. (IV SETS) ×5 IMPLANT
TUBING INJECTOR 48 (MISCELLANEOUS) ×2 IMPLANT
UNDERPAD 30X36 HEAVY ABSORB (UNDERPADS AND DIAPERS) ×3 IMPLANT
WATER STERILE IRR 1000ML POUR (IV SOLUTION) ×3 IMPLANT
WIRE BENTSON .035X145CM (WIRE) ×2 IMPLANT

## 2019-12-20 NOTE — ED Provider Notes (Signed)
[facility] EMERGENCY DEPARTMENT Provider Note   CSN: [id number] Arrival date & time: 12/20/19  1144     History No chief complaint on file.   Thomas Snow is a 85 y.o. male.  Pt presents to the ED today with AMS.  Pt has severe dementia and is unable to give any hx.  Pt's wife said she was changing him this am when he had a 3-4 minute episode of staring into space.  She said he was awake and his eyes were focused, but he was not responding to her.  She said he's had a cough, but he and his wife have been vaccinated + booster as well as the flu shot.  Wife said after he "woke up," he was c/o right leg pain.  He was given fentanyl IV by EMS, but that was about 3 hrs ago.  He denies any pain now.          Past Medical History:  Diagnosis Date  . AICD (automatic cardioverter/defibrillator) present   . Arthritis    "my back hurts right much" (06/04/2016)  . Carotid artery occlusion    left SCA stent '01, carotid occlusive dz  . Chronic lower back pain   . Coronary artery disease    prox and mid RCA atherectomy '01  . Dementia ([place])    "don't know what stage" (06/04/2016)  . GERD (gastroesophageal reflux disease)   . History of kidney stones   . Hyperlipidemia   . Hypertension   . Pneumonia    as a child and again in 02/2014  (06/04/2016)  . Urinary hesitancy     Patient Active Problem List   Diagnosis Date Noted  . Iron deficiency anemia due to chronic blood loss 07/04/2018  . Symptomatic anemia 07/03/2018  . Dementia ([place]) 07/03/2018  . Mixed hypertriglyceridemia 01/31/2018  . Diabetes mellitus type II, non insulin dependent ([place]) 01/31/2018  . Stenosis of both subclavian arteries 01/31/2018  . Critical lower limb ischemia ([place]) 06/04/2016  . CKD (chronic kidney disease) stage 4, GFR 15-29 ml/min (HCC)   . PVD (peripheral vascular disease) ([place]) 06/03/2016  . Claudication in peripheral vascular disease ([place]) 04/13/2016  . Chronic anticoagulation  04/13/2016  . Sepsis ([place]) 02/06/2014  . Community acquired pneumonia 02/06/2014  . Essential hypertension 02/06/2014  . Elevated fasting glucose 02/06/2014  . Mesenteric ischemia ([place]) 09/29/2013  . CAD s/p RCA angioplasty 2001 04/01/2013  . PAD (peripheral artery disease) ([place]) 04/01/2013  . Celiac artery stenosis ([place]) 04/01/2013  . Renal artery stenosis ([place]) 04/01/2013  . Paroxysmal sinus arrest with syncope 04/01/2013  . Pacemaker - dual chamber, implanted in 1985, last generator change 2006, [employer] 04/01/2013  . Carotid stenosis, bilateral s/p R CEA 2013 04/01/2013  . Underweight 04/01/2013  . Hyperlipidemia 04/01/2013  . Paroxysmal atrial fibrillation ([place]) 04/01/2013  . Acute renal failure superimposed on stage 3 chronic kidney disease ([place]) 04/01/2013  . Aftercare following surgery of the circulatory system, [place] 12/23/2012  . Carotid artery stenosis 08/20/2011    Past Surgical History:  Procedure Laterality Date  . BACK SURGERY    . CARDIAC CATHETERIZATION    . CARDIAC CATHETERIZATION  03/09/1999   RCA high-grade 95% stenosis, inflated with a 3.25 IVT cutting balloon at 6-64, 6-41, 6-52, and 6-53, resulting in reductiong of less than 10%. RCA proximal 75% stenosis inflated with a 2.75x10 IVT cutting balloon at 6-65 then exchanged for a 3x10 IVT cutting balloon inflated at 6-83.  Marland Kitchen CARDIAC  PACEMAKER PLACEMENT     [place] '85, '95; [place] '06  . CARDIOVASCULAR STRESS TEST  08/09/2010   Normal pattern of perfusion in all regions. No ECG changes. EKG negative for ischemia.  Marland Kitchen CAROTID ENDARTERECTOMY Right 09-05-11   cea  . CATARACT EXTRACTION, BILATERAL Bilateral   . CORONARY ANGIOPLASTY    . ENDARTERECTOMY  09/05/2011   Procedure: ENDARTERECTOMY CAROTID;  Surgeon: Mal Misty, MD;  Location: [place];  Service: Vascular;  Laterality: Right;  . ICD GENERATOR CHANGE  10/20/2004   Implantation of [facility], model # 5357M/s, serial # [id number]  . kidney  stent Left    left RAS '05  . KIDNEY STONE SURGERY     "between kidney and bladder; cut it then had to open him up to get it back together"  . KNEE SURGERY Right    "forgot why/what they done"  . LOWER EXTREMITY INTERVENTION N/A 06/04/2016   Procedure: Lower Extremity Intervention;  Surgeon: Lorretta Harp, MD;  Location: [facility];  Service: Cardiovascular;  Laterality: N/A;  . LUMBAR [place] SURGERY  1990s  . PERIPHERAL VASCULAR ANGIOGRAM  01/22/2003   Left subclavian 85% fairly focal in-stent restenosis, dilated with a 7x2cm Powerflex balloon at 10-30 and 10-30, resulting in less than 10% residual narrowing. Left renal artery demonstrated 80-85% initially dilated with a 4x15 mm coronary cutting balloon at 10-30 and 12-30, exchanged for a 94mmx2cm Aviator balloon inflations were done at 6-15, 10-30, and 10-30, resulting less than 10% residual.  . PERIPHERAL VASCULAR ANGIOGRAM  07/13/1999   Left subclavian 85% dilated with a 24mmx2cm Cordis Power-Flex balloon at 6-40, a P-204 iliac stent was hand-crimped on a 58mmx2cm Cordis Power-Flex balloon, expanded at 8-40 with reduction to 0%. Left Renal artery 75-80% stenosis, dilated with a 84mmx1.5cm Cordis Power-Flex balloon at 6-40, reduction of stenosis from 80% to 0%.  Marland Kitchen PERIPHERAL VASCULAR INTERVENTION  06/04/2016   Procedure: Peripheral Vascular Intervention;  Surgeon: Lorretta Harp, MD;  Location: [facility];  Service: Cardiovascular;;  SFA  . RENAL DOPPLER  09/25/2012   Celiac artery and SMA-demonstrates narrowing with increased velocities consistent with greater than 50%, right renal- 1-59% diameter reduction, left renal artery stent- 1-59% diameter reduction.  . TONSILLECTOMY    . TRANSTHORACIC ECHOCARDIOGRAM  09-25-2012   EF 55-60%, mild-moderate tricuspid valve regurg       Family History  Problem Relation Age of Onset  . Heart disease Mother   . Arthritis Father     Social History   Tobacco Use  . Smoking status:  Former Smoker    Packs/day: 0.50    Years: 25.00    Pack years: 12.50    Types: Cigarettes    Quit date: 08/19/1996    Years since quitting: 23.3  . Smokeless tobacco: Current User    Types: Snuff, Chew  Vaping Use  . Vaping Use: Never used  Substance Use Topics  . Alcohol use: No  . Drug use: No    Home Medications Prior to Admission medications   Medication Sig Start Date End Date Taking? Authorizing Provider  clopidogrel (PLAVIX) 75 MG tablet Take 1 tablet by mouth once daily with breakfast Patient taking differently: Take 75 mg by mouth daily. 04/16/19  Yes Croitoru, Mihai, MD  donepezil (ARICEPT) 23 MG TABS tablet Take 23 mg by mouth at bedtime. 03/31/17  Yes [provider]  memantine (NAMENDA) 10 MG tablet Take 1 tablet (10 mg total) by  mouth 2 (two) times daily. 04/16/17  Yes Penumalli, Earlean Polka, MD  mirtazapine (REMERON) 15 MG tablet Take 15 mg by mouth at bedtime.   Yes [provider]  ferrous gluconate (FERGON) 324 MG tablet Take 1 tablet (324 mg total) by mouth daily with breakfast. Patient not taking: No sig reported 07/04/18   Kathie Dike, MD  pantoprazole (PROTONIX) 40 MG tablet Take 1 tablet (40 mg total) by mouth daily. Patient not taking: No sig reported 07/05/18   Kathie Dike, MD    Allergies    Atorvastatin and Crestor [rosuvastatin]  Review of Systems   Review of Systems  Respiratory: Positive for cough.   All other systems reviewed and are negative.   Physical Exam Updated Vital Signs BP 108/67   Pulse 81   Temp 97.8 F (36.6 C) (Oral)   Resp 17   SpO2 100%   Physical Exam Vitals and nursing note reviewed.  Constitutional:      Appearance: Normal appearance.  HENT:     Head: Normocephalic and atraumatic.     Right Ear: External ear normal.     Left Ear: External ear normal.     Nose: Nose normal.     Mouth/Throat:     Mouth: Mucous membranes are dry.  Eyes:     Extraocular Movements: Extraocular movements intact.      Conjunctiva/sclera: Conjunctivae normal.     Pupils: Pupils are equal, round, and reactive to light.  Cardiovascular:     Rate and Rhythm: Normal rate and regular rhythm.     Pulses: Normal pulses.     Heart sounds: Normal heart sounds.  Pulmonary:     Effort: Pulmonary effort is normal.     Breath sounds: Normal breath sounds.  Abdominal:     General: Abdomen is flat. Bowel sounds are normal.     Palpations: Abdomen is soft.  Musculoskeletal:        General: Normal range of motion.     Cervical back: Normal range of motion and neck supple.  Skin:    General: Skin is warm.     Capillary Refill: Capillary refill takes less than 2 seconds.  Neurological:     General: No focal deficit present.     Mental Status: He is alert. Mental status is at baseline.  Psychiatric:        Mood and Affect: Mood normal.        Behavior: Behavior normal.     ED Results / Procedures / Treatments   Labs (all labs ordered are listed, but only abnormal results are displayed) Labs Reviewed  CBC - Abnormal; Notable for the following components:      Result Value   WBC 13.3 (*)    All other components within normal limits  DIFFERENTIAL - Abnormal; Notable for the following components:   Neutro Abs 11.5 (*)    All other components within normal limits  COMPREHENSIVE METABOLIC PANEL - Abnormal; Notable for the following components:   Glucose, Bld 232 (*)    Creatinine, Ser 1.98 (*)    Albumin 3.3 (*)    GFR, Estimated 33 (*)    All other components within normal limits  I-STAT CHEM 8, ED - Abnormal; Notable for the following components:   Creatinine, Ser 1.80 (*)    Glucose, Bld 223 (*)    Calcium, Ion 1.11 (*)    All other components within normal limits  RESP PANEL BY RT-PCR (FLU A&B, COVID) ARPGX2  PROTIME-INR  APTT  URINALYSIS, ROUTINE W REFLEX MICROSCOPIC  CBG MONITORING, ED    EKG EKG Interpretation  Date/Time:  Sunday December 20 2019 12:06:13 EST Ventricular Rate:  84 PR  Interval:    QRS Duration: 72 QT Interval:  364 QTC Calculation: 430 R Axis:   46 Text Interpretation: Atrial fibrillation Abnormal ECG pt is tremulous.  likely NSR. Confirmed by Isla Pence [phone]) on 12/20/2019 2:22:12 PM   Radiology CT HEAD WO CONTRAST  Result Date: 12/20/2019 CLINICAL DATA:  Altered mental status.  Possible stroke. EXAM: CT HEAD WITHOUT CONTRAST TECHNIQUE: Contiguous axial images were obtained from the base of the skull through the vertex without intravenous contrast. COMPARISON:  Brain CT 11/16/2019. FINDINGS: Brain: Ventricles and sulci are prominent compatible with atrophy. Periventricular and subcortical white matter hypodensities compatible with chronic microvascular ischemic changes. No evidence for acute cortically based infarct, intracranial hemorrhage, mass lesion or mass-effect. Unchanged hypodensity in the left frontal convexity with encephalomalacia. Vascular: Unremarkable Skull: Intact. Sinuses/Orbits: Mild mucosal thickening right maxillary sinus. Remainder the paranasal sinuses are unremarkable. Other: None. IMPRESSION: 1. No acute intracranial process. 2. Atrophy and chronic microvascular ischemic changes. Electronically Signed   By: Lovey Newcomer M.D.   On: 12/20/2019 13:34   DG Chest Portable 1 View  Result Date: 12/20/2019 CLINICAL DATA:  Cough. EXAM: PORTABLE CHEST 1 VIEW COMPARISON:  November 16, 2019 FINDINGS: Prominent skin folds identified. No pneumothorax noted. Stable pacemaker. The heart, hila, and mediastinum are unremarkable. No nodules or masses. No focal infiltrates. No cause for cough identified. IMPRESSION: No active disease. Electronically Signed   By: Dorise Bullion III M.D   On: 12/20/2019 14:56    Procedures Procedures (including critical care time)  Medications Ordered in ED Medications  sodium chloride flush (NS) 0.9 % injection 3 mL (0 mLs Intravenous Hold 12/20/19 1431)    ED Course  I have reviewed the triage vital signs  and the nursing notes.  Pertinent labs & imaging results that were available during my care of the patient were reviewed by me and considered in my medical decision making (see chart for details).    MDM Rules/Calculators/A&P                          CXR clear.  Labs without any acute abnormality.  UA pending.  Covid pending.  Pacemaker eval pending.  A rep has to come here to do the interrogation.   Pt signed out to Dr. Johnney Killian at shift change.  Final Clinical Impression(s) / ED Diagnoses Final diagnoses:  Upper respiratory tract infection, unspecified type    Rx / DC Orders ED Discharge Orders    None       Isla Pence, MD 12/20/19 1520

## 2019-12-20 NOTE — H&P (Signed)
H&P    Reason for Consult:  Ischemic left leg Referring Physician:  ED MRN #:  [id number]  History of Present Illness: This is a 85 y.o. male with history of advanced dementia, hypertension, hyperlipidemia, peripheral arterial disease, coronary artery disease that vascular surgery has been consulted for ischemic left leg.  Patient arrived to the ED approximately 6 hours ago due to altered mental status.  In the process of work-up there was concern for ischemic left leg given a pale extremity with no signals or pulses.  His wife stated that he was complaining of pain in the left leg earlier this morning.  She states that he is ambulatory and transfers at home.  She primarily takes care of him.  He previously had left SFA revascularization with Viabahn stents after arthrectomy by Dr. Gwenlyn Found in 2018.  His last left lower extremity arterial duplex in January 2021 shows no evidence of significant arterial occlusive disease with patent stent in the left SFA and popliteal artery.  ABIs at the time were 1.02 and triphasic on the left.    Past Medical History:  Diagnosis Date  . AICD (automatic cardioverter/defibrillator) present   . Arthritis    "my back hurts right much" (06/04/2016)  . Carotid artery occlusion    left SCA stent '01, carotid occlusive dz  . Chronic lower back pain   . Coronary artery disease    prox and mid RCA atherectomy '01  . Dementia ([place])    "don't know what stage" (06/04/2016)  . GERD (gastroesophageal reflux disease)   . History of kidney stones   . Hyperlipidemia   . Hypertension   . Pneumonia    as a child and again in 02/2014  (06/04/2016)  . Urinary hesitancy     Past Surgical History:  Procedure Laterality Date  . BACK SURGERY    . CARDIAC CATHETERIZATION    . CARDIAC CATHETERIZATION  03/09/1999   RCA high-grade 95% stenosis, inflated with a 3.25 IVT cutting balloon at 6-64, 6-41, 6-52, and 6-53, resulting in reductiong of less than 10%. RCA proximal 75% stenosis  inflated with a 2.75x10 IVT cutting balloon at 6-65 then exchanged for a 3x10 IVT cutting balloon inflated at 6-83.  Marland Kitchen CARDIAC PACEMAKER PLACEMENT     [place] '85, '95; [place] '06  . CARDIOVASCULAR STRESS TEST  08/09/2010   Normal pattern of perfusion in all regions. No ECG changes. EKG negative for ischemia.  Marland Kitchen CAROTID ENDARTERECTOMY Right 09-05-11   cea  . CATARACT EXTRACTION, BILATERAL Bilateral   . CORONARY ANGIOPLASTY    . ENDARTERECTOMY  09/05/2011   Procedure: ENDARTERECTOMY CAROTID;  Surgeon: Mal Misty, MD;  Location: [facility];  Service: Vascular;  Laterality: Right;  . ICD GENERATOR CHANGE  10/20/2004   Implantation of [facility], model # 5357M/s, serial # [id number]  . kidney stent Left    left RAS '05  . KIDNEY STONE SURGERY     "between kidney and bladder; cut it then had to open him up to get it back together"  . KNEE SURGERY Right    "forgot why/what they done"  . LOWER EXTREMITY INTERVENTION N/A 06/04/2016   Procedure: Lower Extremity Intervention;  Surgeon: Lorretta Harp, MD;  Location: [facility];  Service: Cardiovascular;  Laterality: N/A;  . LUMBAR [place] SURGERY  1990s  . PERIPHERAL VASCULAR ANGIOGRAM  01/22/2003   Left subclavian 85% fairly focal in-stent restenosis, dilated with a 7x2cm Powerflex balloon at 10-30  and 10-30, resulting in less than 10% residual narrowing. Left renal artery demonstrated 80-85% initially dilated with a 4x15 mm coronary cutting balloon at 10-30 and 12-30, exchanged for a 22mmx2cm Aviator balloon inflations were done at 6-15, 10-30, and 10-30, resulting less than 10% residual.  . PERIPHERAL VASCULAR ANGIOGRAM  07/13/1999   Left subclavian 85% dilated with a 10mmx2cm Cordis Power-Flex balloon at 6-40, a P-204 iliac stent was hand-crimped on a 41mmx2cm Cordis Power-Flex balloon, expanded at 8-40 with reduction to 0%. Left Renal artery 75-80% stenosis, dilated with a 62mmx1.5cm Cordis Power-Flex balloon at 6-40, reduction  of stenosis from 80% to 0%.  Marland Kitchen PERIPHERAL VASCULAR INTERVENTION  06/04/2016   Procedure: Peripheral Vascular Intervention;  Surgeon: Lorretta Harp, MD;  Location: [facility];  Service: Cardiovascular;;  SFA  . RENAL DOPPLER  09/25/2012   Celiac artery and SMA-demonstrates narrowing with increased velocities consistent with greater than 50%, right renal- 1-59% diameter reduction, left renal artery stent- 1-59% diameter reduction.  . TONSILLECTOMY    . TRANSTHORACIC ECHOCARDIOGRAM  09-25-2012   EF 55-60%, mild-moderate tricuspid valve regurg    Allergies  Allergen Reactions  . Atorvastatin     Leg pain  . Crestor [Rosuvastatin]     Leg pain    Prior to Admission medications   Medication Sig Start Date End Date Taking? Authorizing Provider  clopidogrel (PLAVIX) 75 MG tablet Take 1 tablet by mouth once daily with breakfast Patient taking differently: Take 75 mg by mouth daily. 04/16/19  Yes Croitoru, Mihai, MD  donepezil (ARICEPT) 23 MG TABS tablet Take 23 mg by mouth at bedtime. 03/31/17  Yes [provider]  memantine (NAMENDA) 10 MG tablet Take 1 tablet (10 mg total) by mouth 2 (two) times daily. 04/16/17  Yes Penumalli, Earlean Polka, MD  mirtazapine (REMERON) 15 MG tablet Take 15 mg by mouth at bedtime.   Yes [provider]  ferrous gluconate (FERGON) 324 MG tablet Take 1 tablet (324 mg total) by mouth daily with breakfast. Patient not taking: No sig reported 07/04/18   Kathie Dike, MD  pantoprazole (PROTONIX) 40 MG tablet Take 1 tablet (40 mg total) by mouth daily. Patient not taking: No sig reported 07/05/18   Kathie Dike, MD    Social History   Socioeconomic History  . Marital status: Married    Spouse name: [name]  . Number of children: Not on file  . Years of education: Not on file  . Highest education level: Not on file  Occupational History    Comment: retired Arboriculturist work  Tobacco Use  . Smoking status: Former Smoker    Packs/day: 0.50     Years: 25.00    Pack years: 12.50    Types: Cigarettes    Quit date: 08/19/1996    Years since quitting: 23.3  . Smokeless tobacco: Current User    Types: Snuff, Chew  Vaping Use  . Vaping Use: Never used  Substance and Sexual Activity  . Alcohol use: No  . Drug use: No  . Sexual activity: Yes  Other Topics Concern  . 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 Heart disease Mother   . Arthritis Father     ROS: [x]  Positive   [ ]  Negative   [ ]  All sytems reviewed and are negative  Cardiovascular: []  chest pain/pressure []  palpitations []  SOB lying flat []  DOE []  pain in legs while walking []  pain in legs at rest []  pain in legs at night []  non-healing ulcers []  hx of DVT []  swelling in legs  Pulmonary: []  productive cough []  asthma/wheezing []  home O2  Neurologic: []  weakness in []  arms []  legs []  numbness in []  arms []  legs []  hx of CVA []  mini stroke [] difficulty speaking or slurred speech []  temporary loss of vision in one eye []  dizziness  Hematologic: []  hx of cancer []  bleeding problems []  problems with blood clotting easily  Endocrine:   []  diabetes []  thyroid disease  GI []  vomiting blood []  blood in stool  GU: []  CKD/renal failure []  HD--[]  M/W/F or []  T/T/S []  burning with urination []  blood in urine  Psychiatric: []  anxiety []  depression  Musculoskeletal: []  arthritis []  joint pain  Integumentary: []  rashes []  ulcers  Constitutional: []  fever []  chills   Physical Examination  Vitals:   12/20/19 1730 12/20/19 1748  BP: 96/70   Pulse: 60 95   Resp: 17 18  Temp:    SpO2: 97% 100%   There is no height or weight on file to calculate BMI.  General:  NAD Gait: Not observed HENT: WNL, normocephalic Pulmonary: no respiratory distress Cardiac: regular, without  Murmurs, rubs or gallops Abdomen: soft, NT/ND Vascular Exam/Pulses: Bilateral femoral pulses are palpable Right DP palpable Left pedal pulses nonpalpable and no signals and foot is pale Extremities: ischemic changes to left foot Musculoskeletal: no muscle wasting or atrophy  Neurologic: A&O X 3; Appropriate Affect ; SENSATION: normal; MOTOR FUNCTION:  moving all extremities equally. Speech is fluent/normal   CBC    Component Value Date/Time   WBC 13.3 (H) 12/20/2019 1226   RBC 4.86 12/20/2019 1226   HGB 14.3 12/20/2019 1227   HCT 42.0 12/20/2019 1227   PLT 349 12/20/2019 1226   MCV 87.7 12/20/2019 1226   MCH 27.6 12/20/2019 1226   MCHC 31.5 12/20/2019 1226   RDW 13.2 12/20/2019 1226   LYMPHSABS 1.0 12/20/2019 1226   MONOABS 0.6 12/20/2019 1226   EOSABS 0.1 12/20/2019 1226   BASOSABS 0.1 12/20/2019 1226    BMET    Component Value Date/Time   NA 138 12/20/2019 1227   K 4.3 12/20/2019 1227   CL 101 12/20/2019 1227   CO2 25 12/20/2019 1226   GLUCOSE 223 (H) 12/20/2019 1227   BUN 20 12/20/2019 1227   CREATININE 1.80 (H) 12/20/2019 1227   CREATININE 1.60 (H) 05/30/2016 1122   CALCIUM 8.9 12/20/2019 1226   GFRNONAA 33 (L) 12/20/2019 1226   GFRNONAA 40 (L) 05/30/2016 1122   GFRAA 31 (L) 07/04/2018 1120   GFRAA 46 (L) 05/30/2016 1122    COAGS: Lab Results  Component Value Date   INR 1.0 12/20/2019   INR 1.0 05/30/2016   INR 1.05 08/29/2011     Non-Invasive Vascular Imaging:    No new imaging today but I did review his arteriogram images from 2018 -near flush left FA occlusion that was recanalized with Viabahn stents from the SFA origin to the above-knee popliteal artery with three-vessel runoff  ASSESSMENT/PLAN: This is a 85 y.o.  male with  multiple medical issues including advanced dementia that presents with ischemic left leg in the setting of previous left SFA atherectomy and Viabahn covered stenting in 2018 by Dr. Gwenlyn Found.  Patient was being worked up in the ED for brief period of altered mental status and noted to have ischemic left leg later this evening.  I cannot find any Doppler signals in the left foot and the foot is indeed pale.  He does have an easily palpable left femoral pulse.  I suspect his left SFA stents are occluded.  Would be difficult to know the duration given he has advanced dementia and is a very poor historian.  There would be some chance the stents are chornically occluded before today given he is a poor historian with dementia.  Discussed with him and his wife options of left leg thrombectomy and likely angiogram and then possible bypass if I cannot get his stents open.  We also discussed no surgery and likely limb loss.  Discussed with his wife will be at risk for limb loss even with intervention.  She takes care of him and states he is ambulatory and she would like to save his leg.  We will proceed to the operating room.  Marty Heck, MD [facility] Office: [phone]

## 2019-12-20 NOTE — ED Notes (Signed)
Repositioned pt in bed. Pt attempted to get out of bed. Pt is agitated.

## 2019-12-20 NOTE — Anesthesia Preprocedure Evaluation (Addendum)
Anesthesia Evaluation  Patient identified by MRN, date of birth, ID band Patient awake    Reviewed: Allergy & Precautions, NPO status , Patient's Chart, lab work & pertinent test results  Airway Mallampati: II  TM Distance: >3 FB Neck ROM: Full    Dental  (+) Teeth Intact, Dental Advisory Given   Pulmonary former smoker,    breath sounds clear to auscultation       Cardiovascular hypertension, + CAD and + Peripheral Vascular Disease  + dysrhythmias Atrial Fibrillation + pacemaker  Rhythm:Regular Rate:Normal     Neuro/Psych Dementia    GI/Hepatic Neg liver ROS, GERD  Medicated,  Endo/Other  diabetes  Renal/GU Renal disease  negative genitourinary   Musculoskeletal  (+) Arthritis ,   Abdominal Normal abdominal exam  (+)   Peds  Hematology negative hematology ROS (+)   Anesthesia Other Findings Advanced dementia, oriented to self.  Wife at bedside and  Answers questions for him.  Wife states device is a pacemaker and not an AICD.  Reproductive/Obstetrics                           Anesthesia Physical Anesthesia Plan  ASA: III and emergent  Anesthesia Plan: General   Post-op Pain Management:    Induction: Intravenous  PONV Risk Score and Plan: 3 and Ondansetron, Dexamethasone and Treatment may vary due to age or medical condition  Airway Management Planned: Oral ETT  Additional Equipment: None  Intra-op Plan:   Post-operative Plan: Extubation in OR  Informed Consent: I have reviewed the patients History and Physical, chart, labs and discussed the procedure including the risks, benefits and alternatives for the proposed anesthesia with the patient or authorized representative who has indicated his/her understanding and acceptance.     Dental advisory given  Plan Discussed with: CRNA  Anesthesia Plan Comments: (Echo:  - Left ventricle: The cavity size was normal. Wall thickness   was normal. Systolic function was normal. The estimated  ejection fraction was in the range of 55% to 60%. Wall  motion was normal; there were no regional wall motion  abnormalities. Left ventricular diastolic function  parameters were normal.  - Mitral valve: Mild regurgitation. Valve area by pressure  half-time: 2.44cm^2.  - Tricuspid valve: Mild-moderate regurgitation.  )      Anesthesia Quick Evaluation

## 2019-12-20 NOTE — ED Notes (Signed)
[employer] contacted to have representative paged out to hospital for in person interrogation of pacemaker.

## 2019-12-20 NOTE — ED Provider Notes (Addendum)
Patient's wife reports that he requires her full care due to dementia but at baseline, once up can ambulate independently without a cane.  She reports he does complain of nighttime leg pain.  This morning, he complained of a lot of pain in his legs and ostensibly the right leg.  She reports that she had to go to more extreme measures to get him dressed and he would not get out of bed and walk.  That is not typical for him.  She reports also he seemed kind of dazed and that was also atypical.  EMS was contacted.  She reports she was able to urged him to get out of bed and walk to the EMS stretcher. Physical Exam  BP (!) 124/58   Pulse 84   Temp 97.8 F (36.6 C) (Oral)   Resp 19   SpO2 97%   Physical Exam Constitutional:      Comments: Patient is awake and alert.  He exhibits dementia but is no acute distress and no respiratory distress.  At times becomes agitated with fidgeting and tremor.  HENT:     Head: Normocephalic and atraumatic.     Mouth/Throat:     Pharynx: Oropharynx is clear.  Eyes:     Extraocular Movements: Extraocular movements intact.  Pulmonary:     Effort: Pulmonary effort is normal.  Abdominal:     General: There is no distension.     Palpations: Abdomen is soft.     Tenderness: There is no abdominal tenderness. There is no guarding.  Musculoskeletal:     Comments: Patient is lower extremities examined.  With removing socks, left foot has extreme pallor relative to the right.  Right foot I am able to manually palpate a dorsalis pedis pulse and find posterior tibial with hand-held Doppler.  Left foot is cool to the touch and extremely pale.  I cannot find pulses with hand-held Doppler in the DP or PT.  Skin:    General: Skin is warm and dry.  Neurological:     Comments: Patient is alert.  He talks to his wife.  He clearly has dementia.  His speech is clear.         ED Course/Procedures     Procedures CRITICAL CARE Performed by: Charlesetta Shanks   Total  critical care time: 30 minutes  Critical care time was exclusive of separately billable procedures and treating other patients.  Critical care was necessary to treat or prevent imminent or life-threatening deterioration.  Critical care was time spent personally by me on the following activities: development of treatment plan with patient and/or surrogate as well as nursing, discussions with consultants, evaluation of patient's response to treatment, examination of patient, obtaining history from patient or surrogate, ordering and performing treatments and interventions, ordering and review of laboratory studies, ordering and review of radiographic studies, pulse oximetry and re-evaluation of patient's condition.   MDM  Repeat examination shows concern for an ischemic left foot.  Will consult vascular surgery.  Patient is a known vasculopath.  Takes daily Plavix.  Consult: 17: 14 Dr. Carlis Abbott for vascular.  Will evaluate patient in the emergency department.  Patient will go to the OR for revascularization.    Charlesetta Shanks, MD 12/20/19 1721    Charlesetta Shanks, MD 12/20/19 Gabriel Rung    Charlesetta Shanks, MD 12/20/19 [phone]

## 2019-12-20 NOTE — Anesthesia Procedure Notes (Signed)
Procedure Name: Intubation Date/Time: 12/20/2019 7:51 PM Performed by: Suzy Bouchard, CRNA Pre-anesthesia Checklist: Patient identified, Emergency Drugs available, Suction available, Patient being monitored and Timeout performed Patient Re-evaluated:Patient Re-evaluated prior to induction Oxygen Delivery Method: Circle system utilized Preoxygenation: Pre-oxygenation with 100% oxygen Induction Type: IV induction Ventilation: Mask ventilation without difficulty and Oral airway inserted - appropriate to patient size Laryngoscope Size: Sabra Heck and 2 Grade View: Grade I Tube type: Oral Tube size: 7.5 mm Number of attempts: 1 Airway Equipment and Method: Stylet Placement Confirmation: ETT inserted through vocal cords under direct vision,  positive ETCO2 and breath sounds checked- equal and bilateral Secured at: 22 cm Tube secured with: Tape Dental Injury: Teeth and Oropharynx as per pre-operative assessment

## 2019-12-20 NOTE — CV Procedure (Addendum)
Hospital Consult    Reason for Consult:  Ischemic left leg Referring Physician:  ED MRN #:  [id number]  History of Present Illness: This is a 85 y.o. male with history of advanced dementia, hypertension, hyperlipidemia, peripheral arterial disease, coronary artery disease that vascular surgery has been consulted for ischemic left leg.  Patient arrived to the ED approximately 6 hours ago due to altered mental status.  In the process of work-up there was concern for ischemic left leg given a pale extremity with no signals or pulses.  His wife stated that he was complaining of pain in the left leg earlier this morning.  She states that he is ambulatory and transfers at home.  She primarily takes care of him.  He previously had left SFA revascularization with Viabahn stents after arthrectomy by Dr. Gwenlyn Found in 2018.  His last left lower extremity arterial duplex in January 2021 shows no evidence of significant arterial occlusive disease with patent stent in the left SFA and popliteal artery.  ABIs at the time were 1.02 and triphasic on the left.    Past Medical History:  Diagnosis Date  . AICD (automatic cardioverter/defibrillator) present   . Arthritis    "my back hurts right much" (06/04/2016)  . Carotid artery occlusion    left SCA stent '01, carotid occlusive dz  . Chronic lower back pain   . Coronary artery disease    prox and mid RCA atherectomy '01  . Dementia ([place])    "don't know what stage" (06/04/2016)  . GERD (gastroesophageal reflux disease)   . History of kidney stones   . Hyperlipidemia   . Hypertension   . Pneumonia    as a child and again in 02/2014  (06/04/2016)  . Urinary hesitancy     Past Surgical History:  Procedure Laterality Date  . BACK SURGERY    . CARDIAC CATHETERIZATION    . CARDIAC CATHETERIZATION  03/09/1999   RCA high-grade 95% stenosis, inflated with a 3.25 IVT cutting balloon at 6-64, 6-41, 6-52, and 6-53, resulting in reductiong of less than 10%. RCA proximal  75% stenosis inflated with a 2.75x10 IVT cutting balloon at 6-65 then exchanged for a 3x10 IVT cutting balloon inflated at 6-83.  Marland Kitchen CARDIAC PACEMAKER PLACEMENT     [place] '85, '95; [place] '06  . CARDIOVASCULAR STRESS TEST  08/09/2010   Normal pattern of perfusion in all regions. No ECG changes. EKG negative for ischemia.  Marland Kitchen CAROTID ENDARTERECTOMY Right 09-05-11   cea  . CATARACT EXTRACTION, BILATERAL Bilateral   . CORONARY ANGIOPLASTY    . ENDARTERECTOMY  09/05/2011   Procedure: ENDARTERECTOMY CAROTID;  Surgeon: Mal Misty, MD;  Location: [facility];  Service: Vascular;  Laterality: Right;  . ICD GENERATOR CHANGE  10/20/2004   Implantation of [facility] [place], model # 5357M/s, serial # [id number]  . kidney stent Left    left RAS '05  . KIDNEY STONE SURGERY     "between kidney and bladder; cut it then had to open him up to get it back together"  . KNEE SURGERY Right    "forgot why/what they done"  . LOWER EXTREMITY INTERVENTION N/A 06/04/2016   Procedure: Lower Extremity Intervention;  Surgeon: Lorretta Harp, MD;  Location: [place] [facility];  Service: Cardiovascular;  Laterality: N/A;  . LUMBAR [place] SURGERY  1990s  . PERIPHERAL VASCULAR ANGIOGRAM  01/22/2003   Left subclavian 85% fairly focal in-stent restenosis, dilated with a 7x2cm Powerflex balloon at  10-30 and 10-30, resulting in less than 10% residual narrowing. Left renal artery demonstrated 80-85% initially dilated with a 4x15 mm coronary cutting balloon at 10-30 and 12-30, exchanged for a 7mmx2cm Aviator balloon inflations were done at 6-15, 10-30, and 10-30, resulting less than 10% residual.  . PERIPHERAL VASCULAR ANGIOGRAM  07/13/1999   Left subclavian 85% dilated with a 55mmx2cm Cordis Power-Flex balloon at 6-40, a P-204 iliac stent was hand-crimped on a 46mmx2cm Cordis Power-Flex balloon, expanded at 8-40 with reduction to 0%. Left Renal artery 75-80% stenosis, dilated with a 38mmx1.5cm Cordis Power-Flex balloon at  6-40, reduction of stenosis from 80% to 0%.  Marland Kitchen PERIPHERAL VASCULAR INTERVENTION  06/04/2016   Procedure: Peripheral Vascular Intervention;  Surgeon: Lorretta Harp, MD;  Location: [facility];  Service: Cardiovascular;;  SFA  . RENAL DOPPLER  09/25/2012   Celiac artery and SMA-demonstrates narrowing with increased velocities consistent with greater than 50%, right renal- 1-59% diameter reduction, left renal artery stent- 1-59% diameter reduction.  . TONSILLECTOMY    . TRANSTHORACIC ECHOCARDIOGRAM  09-25-2012   EF 55-60%, mild-moderate tricuspid valve regurg    Allergies  Allergen Reactions  . Atorvastatin     Leg pain  . Crestor [Rosuvastatin]     Leg pain    Prior to Admission medications   Medication Sig Start Date End Date Taking? Authorizing Provider  clopidogrel (PLAVIX) 75 MG tablet Take 1 tablet by mouth once daily with breakfast Patient taking differently: Take 75 mg by mouth daily. 04/16/19  Yes Croitoru, Mihai, MD  donepezil (ARICEPT) 23 MG TABS tablet Take 23 mg by mouth at bedtime. 03/31/17  Yes [provider]  memantine (NAMENDA) 10 MG tablet Take 1 tablet (10 mg total) by mouth 2 (two) times daily. 04/16/17  Yes Penumalli, Earlean Polka, MD  mirtazapine (REMERON) 15 MG tablet Take 15 mg by mouth at bedtime.   Yes [provider]  ferrous gluconate (FERGON) 324 MG tablet Take 1 tablet (324 mg total) by mouth daily with breakfast. Patient not taking: No sig reported 07/04/18   Kathie Dike, MD  pantoprazole (PROTONIX) 40 MG tablet Take 1 tablet (40 mg total) by mouth daily. Patient not taking: No sig reported 07/05/18   Kathie Dike, MD    Social History   Socioeconomic History  . Marital status: Married    Spouse name: [name]  . Number of children: Not on file  . Years of education: Not on file  . Highest education level: Not on file  Occupational History    Comment: retired Arboriculturist work  Tobacco Use  . Smoking status: Former Smoker     Packs/day: 0.50    Years: 25.00    Pack years: 12.50    Types: Cigarettes    Quit date: 08/19/1996    Years since quitting: 23.3  . Smokeless tobacco: Current User    Types: Snuff, Chew  Vaping Use  . Vaping Use: Never used  Substance and Sexual Activity  . Alcohol use: No  . Drug use: No  . Sexual activity: Yes  Other Topics Concern  . Not on file  Social History Narrative   Lives with wife, retired    [employer] data [employer] school   Children 3    Social Determinants of Health   Financial Resource Strain: Not on file  Food Insecurity: Not on file  Transportation Needs: Not on file  Physical Activity: Not on file  Stress: Not on file  Social Connections: Not on file  Intimate  Partner Violence: Not on file     Family History  Problem Relation Age of Onset  . Heart disease Mother   . Arthritis Father     ROS: [x]  Positive   [ ]  Negative   [ ]  All sytems reviewed and are negative  Cardiovascular: []  chest pain/pressure []  palpitations []  SOB lying flat []  DOE []  pain in legs while walking []  pain in legs at rest []  pain in legs at night []  non-healing ulcers []  hx of DVT []  swelling in legs  Pulmonary: []  productive cough []  asthma/wheezing []  home O2  Neurologic: []  weakness in []  arms []  legs []  numbness in []  arms []  legs []  hx of CVA []  mini stroke [] difficulty speaking or slurred speech []  temporary loss of vision in one eye []  dizziness  Hematologic: []  hx of cancer []  bleeding problems []  problems with blood clotting easily  Endocrine:   []  diabetes []  thyroid disease  GI []  vomiting blood []  blood in stool  GU: []  CKD/renal failure []  HD--[]  M/W/F or []  T/T/S []  burning with urination []  blood in urine  Psychiatric: []  anxiety []  depression  Musculoskeletal: []  arthritis []  joint pain  Integumentary: []  rashes []  ulcers  Constitutional: []  fever []  chills   Physical Examination  Vitals:   12/20/19 1730 12/20/19 1748  BP: 96/70    Pulse: 60 95  Resp: 17 18  Temp:    SpO2: 97% 100%   There is no height or weight on file to calculate BMI.  General:  NAD Gait: Not observed HENT: WNL, normocephalic Pulmonary: no respiratory distress Cardiac: regular, without  Murmurs, rubs or gallops Abdomen: soft, NT/ND Vascular Exam/Pulses: Bilateral femoral pulses are palpable Right DP palpable Left pedal pulses nonpalpable and no signals and foot is pale Extremities: ischemic changes to left foot Musculoskeletal: no muscle wasting or atrophy  Neurologic: A&O X 3; Appropriate Affect ; SENSATION: normal; MOTOR FUNCTION:  moving all extremities equally. Speech is fluent/normal   CBC    Component Value Date/Time   WBC 13.3 (H) 12/20/2019 1226   RBC 4.86 12/20/2019 1226   HGB 14.3 12/20/2019 1227   HCT 42.0 12/20/2019 1227   PLT 349 12/20/2019 1226   MCV 87.7 12/20/2019 1226   MCH 27.6 12/20/2019 1226   MCHC 31.5 12/20/2019 1226   RDW 13.2 12/20/2019 1226   LYMPHSABS 1.0 12/20/2019 1226   MONOABS 0.6 12/20/2019 1226   EOSABS 0.1 12/20/2019 1226   BASOSABS 0.1 12/20/2019 1226    BMET    Component Value Date/Time   NA 138 12/20/2019 1227   K 4.3 12/20/2019 1227   CL 101 12/20/2019 1227   CO2 25 12/20/2019 1226   GLUCOSE 223 (H) 12/20/2019 1227   BUN 20 12/20/2019 1227   CREATININE 1.80 (H) 12/20/2019 1227   CREATININE 1.60 (H) 05/30/2016 1122   CALCIUM 8.9 12/20/2019 1226   GFRNONAA 33 (L) 12/20/2019 1226   GFRNONAA 40 (L) 05/30/2016 1122   GFRAA 31 (L) 07/04/2018 1120   GFRAA 46 (L) 05/30/2016 1122    COAGS: Lab Results  Component Value Date   INR 1.0 12/20/2019   INR 1.0 05/30/2016   INR 1.05 08/29/2011     Non-Invasive Vascular Imaging:    No new imaging today but I did review his arteriogram images from 2018 -near flush left FA occlusion that was recanalized with Viabahn stents from the SFA origin to the above-knee popliteal artery with three-vessel runoff  ASSESSMENT/PLAN: This is a 85  y.o. male with multiple medical issues including advanced dementia that presents with ischemic left leg in the setting of previous left SFA atherectomy and Viabahn covered stenting in 2018 by Dr. Gwenlyn Found.  Patient was being worked up in the ED for brief period of altered mental status and noted to have ischemic left leg later this evening.  I cannot find any Doppler signals in the left foot and the foot is indeed pale.  He does have an easily palpable left femoral pulse.  I suspect his left SFA stents are occluded.  Would be difficult to know the duration given he has advanced dementia and is a very poor historian.  There would be some chance the stents are chornically occluded before today given he is a poor historian with dementia.  Discussed with him and his wife options of left leg thrombectomy and likely angiogram and then possible bypass if I cannot get his stents open.  We also discussed no surgery and likely limb loss.  Discussed with his wife will be at risk for limb loss even with intervention.  She takes care of him and states he is ambulatory and she would like to save his leg.  We will proceed to the operating room.  Marty Heck, MD [facility] Office: [place]

## 2019-12-20 NOTE — ED Notes (Signed)
Pt agitated. Pt unable to tolerate orthostatic VS obtainment. Notified provider.

## 2019-12-20 NOTE — ED Triage Notes (Signed)
Patient arrived from home by [facility]. Family reports that patient was staring in space and EMS reports hx of dementia-CBG 210. Patient alert to baseline. Patient has no complaints and states he wants to go home. MAEX4. Patient also received fentanyl IV prior to arrival for bilateral leg pain

## 2019-12-20 NOTE — ED Notes (Signed)
Lactic Acid 2.6, notified Pfeiffer MD

## 2019-12-20 NOTE — ED Notes (Signed)
[employer] tech at bedside to interrogate pacemaker

## 2019-12-21 ENCOUNTER — Inpatient Hospital Stay (HOSPITAL_COMMUNITY): Payer: PPO

## 2019-12-21 ENCOUNTER — Encounter (HOSPITAL_COMMUNITY): Payer: Self-pay | Admitting: Vascular Surgery

## 2019-12-21 DIAGNOSIS — I361 Nonrheumatic tricuspid (valve) insufficiency: Secondary | ICD-10-CM

## 2019-12-21 DIAGNOSIS — I340 Nonrheumatic mitral (valve) insufficiency: Secondary | ICD-10-CM

## 2019-12-21 DIAGNOSIS — F0391 Unspecified dementia with behavioral disturbance: Secondary | ICD-10-CM

## 2019-12-21 DIAGNOSIS — R011 Cardiac murmur, unspecified: Secondary | ICD-10-CM

## 2019-12-21 LAB — BASIC METABOLIC PANEL
Anion gap: 16 — ABNORMAL HIGH (ref 5–15)
BUN: 16 mg/dL (ref 8–23)
CO2: 15 mmol/L — ABNORMAL LOW (ref 22–32)
Calcium: 7.6 mg/dL — ABNORMAL LOW (ref 8.9–10.3)
Chloride: 106 mmol/L (ref 98–111)
Creatinine, Ser: 2.02 mg/dL — ABNORMAL HIGH (ref 0.61–1.24)
GFR, Estimated: 32 mL/min — ABNORMAL LOW (ref >60)
Glucose, Bld: 262 mg/dL — ABNORMAL HIGH (ref 70–99)
Potassium: 3.8 mmol/L (ref 3.5–5.1)
Sodium: 137 mmol/L (ref 135–145)

## 2019-12-21 LAB — POCT I-STAT, CHEM 8
BUN: 14 mg/dL (ref 8–23)
BUN: 14 mg/dL (ref 8–23)
Calcium, Ion: 1.08 mmol/L — ABNORMAL LOW (ref 1.15–1.40)
Calcium, Ion: 1.14 mmol/L — ABNORMAL LOW (ref 1.15–1.40)
Chloride: 104 mmol/L (ref 98–111)
Chloride: 104 mmol/L (ref 98–111)
Creatinine, Ser: 1.4 mg/dL — ABNORMAL HIGH (ref 0.61–1.24)
Creatinine, Ser: 1.5 mg/dL — ABNORMAL HIGH (ref 0.61–1.24)
Glucose, Bld: 156 mg/dL — ABNORMAL HIGH (ref 70–99)
Glucose, Bld: 174 mg/dL — ABNORMAL HIGH (ref 70–99)
HCT: 28 % — ABNORMAL LOW (ref 39.0–52.0)
HCT: 29 % — ABNORMAL LOW (ref 39.0–52.0)
Hemoglobin: 9.5 g/dL — ABNORMAL LOW (ref 13.0–17.0)
Hemoglobin: 9.9 g/dL — ABNORMAL LOW (ref 13.0–17.0)
Potassium: 3.7 mmol/L (ref 3.5–5.1)
Potassium: 4 mmol/L (ref 3.5–5.1)
Sodium: 140 mmol/L (ref 135–145)
Sodium: 140 mmol/L (ref 135–145)
TCO2: 22 mmol/L (ref 22–32)
TCO2: 22 mmol/L (ref 22–32)

## 2019-12-21 LAB — CBC
HCT: 26.5 % — ABNORMAL LOW (ref 39.0–52.0)
Hemoglobin: 8.7 g/dL — ABNORMAL LOW (ref 13.0–17.0)
MCH: 28.5 pg (ref 26.0–34.0)
MCHC: 32.8 g/dL (ref 30.0–36.0)
MCV: 86.9 fL (ref 80.0–100.0)
Platelets: 247 10*3/uL (ref 150–400)
RBC: 3.05 MIL/uL — ABNORMAL LOW (ref 4.22–5.81)
RDW: 13.4 % (ref 11.5–15.5)
WBC: 14.7 10*3/uL — ABNORMAL HIGH (ref 4.0–10.5)
nRBC: 0 % (ref 0.0–0.2)

## 2019-12-21 LAB — LIPID PANEL
Cholesterol: 78 mg/dL (ref 0–200)
HDL: 19 mg/dL — ABNORMAL LOW (ref >40)
LDL Cholesterol: 42 mg/dL (ref 0–99)
Total CHOL/HDL Ratio: 4.1 RATIO
Triglycerides: 87 mg/dL (ref <150)
VLDL: 17 mg/dL (ref 0–40)

## 2019-12-21 LAB — ECHOCARDIOGRAM COMPLETE
Area-P 1/2: 2.8 cm2
Height: 66 in
S' Lateral: 2.4 cm
Weight: 1509.71 oz

## 2019-12-21 LAB — HEMOGLOBIN A1C
Hgb A1c MFr Bld: 6.8 % — ABNORMAL HIGH (ref 4.8–5.6)
Mean Plasma Glucose: 148.46 mg/dL

## 2019-12-21 LAB — GLUCOSE, CAPILLARY
Glucose-Capillary: 191 mg/dL — ABNORMAL HIGH (ref 70–99)
Glucose-Capillary: 239 mg/dL — ABNORMAL HIGH (ref 70–99)
Glucose-Capillary: 250 mg/dL — ABNORMAL HIGH (ref 70–99)
Glucose-Capillary: 146 mg/dL — ABNORMAL HIGH (ref 70–99)
Glucose-Capillary: 114 mg/dL — ABNORMAL HIGH (ref 70–99)
Glucose-Capillary: 158 mg/dL — ABNORMAL HIGH (ref 70–99)

## 2019-12-21 LAB — LACTIC ACID, PLASMA: Lactic Acid, Venous: 1.7 mmol/L (ref 0.5–1.9)

## 2019-12-21 LAB — CK: Total CK: 256 U/L (ref 49–397)

## 2019-12-21 LAB — MRSA PCR SCREENING: MRSA by PCR: POSITIVE — AB

## 2019-12-21 MED ORDER — HYDRALAZINE HCL 20 MG/ML IJ SOLN
5.0000 mg | INTRAMUSCULAR | Status: DC | PRN
Start: 2019-12-21 — End: 2019-12-21

## 2019-12-21 MED ORDER — ASPIRIN EC 81 MG PO TBEC
81.0000 mg | DELAYED_RELEASE_TABLET | Freq: Every day | ORAL | Status: DC
  Administered 2019-12-22: 05:00:00 81 mg via ORAL
  Filled 2019-12-21 (×2): qty 1

## 2019-12-21 MED ORDER — MIDAZOLAM HCL 2 MG/2ML IJ SOLN
0.5000 mg | Freq: Once | INTRAMUSCULAR | Status: DC | PRN
Start: 2019-12-21 — End: 2019-12-21

## 2019-12-21 MED ORDER — HEPARIN SODIUM (PORCINE) 5000 UNIT/ML IJ SOLN
5000.0000 [IU] | Freq: Three times a day (TID) | INTRAMUSCULAR | Status: DC
  Administered 2019-12-22 (×2): 5000 [IU] via SUBCUTANEOUS
  Filled 2019-12-21 (×3): qty 1

## 2019-12-21 MED ORDER — FENTANYL CITRATE (PF) 100 MCG/2ML IJ SOLN: 25.0000 ug | INTRAMUSCULAR | Status: DC | PRN

## 2019-12-21 MED ORDER — CHLORHEXIDINE GLUCONATE CLOTH 2 % EX PADS
6.0000 | MEDICATED_PAD | Freq: Every day | CUTANEOUS | Status: DC
  Administered 2019-12-21 – 2019-12-23 (×2): 6 via TOPICAL

## 2019-12-21 MED ORDER — DOCUSATE SODIUM 100 MG PO CAPS
100.0000 mg | ORAL_CAPSULE | Freq: Every day | ORAL | Status: DC
  Administered 2019-12-22: 09:00:00 100 mg via ORAL
  Filled 2019-12-21 (×2): qty 1

## 2019-12-21 MED ORDER — ALBUMIN HUMAN 5 % IV SOLN
INTRAVENOUS | Status: AC
  Filled 2019-12-21: qty 250

## 2019-12-21 MED ORDER — ACETAMINOPHEN 325 MG PO TABS: 325.0000 mg | ORAL_TABLET | ORAL | Status: DC | PRN

## 2019-12-21 MED ORDER — SODIUM CHLORIDE 0.9 % IV SOLN: INTRAVENOUS | Status: DC

## 2019-12-21 MED ORDER — LABETALOL HCL 5 MG/ML IV SOLN: 10.0000 mg | INTRAVENOUS | Status: DC | PRN

## 2019-12-21 MED ORDER — ALBUMIN HUMAN 5 % IV SOLN
12.5000 g | Freq: Once | INTRAVENOUS | Status: AC
  Administered 2019-12-21: 01:00:00 12.5 g via INTRAVENOUS

## 2019-12-21 MED ORDER — MAGNESIUM SULFATE 2 GM/50ML IV SOLN: 2.0000 g | Freq: Every day | INTRAVENOUS | Status: DC | PRN

## 2019-12-21 MED ORDER — PHENOL 1.4 % MT LIQD: 1.0000 | OROMUCOSAL | Status: DC | PRN

## 2019-12-21 MED ORDER — DONEPEZIL HCL 23 MG PO TABS
23.0000 mg | ORAL_TABLET | Freq: Every day | ORAL | Status: DC
  Administered 2019-12-21 – 2019-12-22 (×2): 23 mg via ORAL
  Filled 2019-12-21 (×4): qty 1

## 2019-12-21 MED ORDER — ACETAMINOPHEN 325 MG RE SUPP
325.0000 mg | RECTAL | Status: DC | PRN
  Filled 2019-12-21: qty 2

## 2019-12-21 MED ORDER — CHLORHEXIDINE GLUCONATE CLOTH 2 % EX PADS
6.0000 | MEDICATED_PAD | Freq: Every day | CUTANEOUS | Status: DC
  Administered 2019-12-21 – 2019-12-23 (×3): 6 via TOPICAL

## 2019-12-21 MED ORDER — LABETALOL HCL 5 MG/ML IV SOLN: 5.0000 mg | INTRAVENOUS | Status: DC | PRN

## 2019-12-21 MED ORDER — CEFAZOLIN SODIUM 1 G IJ SOLR
INTRAMUSCULAR | Status: AC
  Filled 2019-12-21: qty 20

## 2019-12-21 MED ORDER — HYDRALAZINE HCL 20 MG/ML IJ SOLN: 5.0000 mg | INTRAMUSCULAR | Status: DC | PRN

## 2019-12-21 MED ORDER — ONDANSETRON HCL 4 MG/2ML IJ SOLN: 4.0000 mg | Freq: Four times a day (QID) | INTRAMUSCULAR | Status: DC | PRN

## 2019-12-21 MED ORDER — ALUM & MAG HYDROXIDE-SIMETH 200-200-20 MG/5ML PO SUSP: 15.0000 mL | ORAL | Status: DC | PRN

## 2019-12-21 MED ORDER — SUGAMMADEX SODIUM 200 MG/2ML IV SOLN
INTRAVENOUS | Status: DC | PRN
  Administered 2019-12-21: 100 mg via INTRAVENOUS

## 2019-12-21 MED ORDER — SENNOSIDES-DOCUSATE SODIUM 8.6-50 MG PO TABS: 1.0000 | ORAL_TABLET | Freq: Every evening | ORAL | Status: DC | PRN

## 2019-12-21 MED ORDER — MUPIROCIN 2 % EX OINT
1.0000 "application " | TOPICAL_OINTMENT | Freq: Two times a day (BID) | CUTANEOUS | Status: DC
  Administered 2019-12-21 – 2019-12-23 (×5): 1 via NASAL
  Filled 2019-12-21: qty 22

## 2019-12-21 MED ORDER — SODIUM CHLORIDE 0.9 % IV SOLN: 500.0000 mL | Freq: Once | INTRAVENOUS | Status: DC | PRN

## 2019-12-21 MED ORDER — MEMANTINE HCL 10 MG PO TABS
10.0000 mg | ORAL_TABLET | Freq: Two times a day (BID) | ORAL | Status: DC
  Administered 2019-12-21 – 2019-12-23 (×5): 10 mg via ORAL
  Filled 2019-12-21 (×6): qty 1

## 2019-12-21 MED ORDER — MIRTAZAPINE 15 MG PO TABS
15.0000 mg | ORAL_TABLET | Freq: Every day | ORAL | Status: DC
  Administered 2019-12-21 – 2019-12-22 (×2): 15 mg via ORAL
  Filled 2019-12-21 (×2): qty 1

## 2019-12-21 MED ORDER — CLOPIDOGREL BISULFATE 75 MG PO TABS
75.0000 mg | ORAL_TABLET | Freq: Every day | ORAL | Status: DC
  Administered 2019-12-21 – 2019-12-23 (×3): 75 mg via ORAL
  Filled 2019-12-21 (×3): qty 1

## 2019-12-21 MED ORDER — CEFAZOLIN SODIUM-DEXTROSE 2-4 GM/100ML-% IV SOLN
2.0000 g | Freq: Three times a day (TID) | INTRAVENOUS | Status: AC
  Administered 2019-12-21 (×2): 2 g via INTRAVENOUS
  Filled 2019-12-21 (×2): qty 100

## 2019-12-21 MED ORDER — METOPROLOL TARTRATE 5 MG/5ML IV SOLN
2.0000 mg | INTRAVENOUS | Status: DC | PRN
  Administered 2019-12-21: 08:00:00 5 mg via INTRAVENOUS
  Filled 2019-12-21: qty 5

## 2019-12-21 MED ORDER — HYDROMORPHONE HCL 1 MG/ML IJ SOLN
0.5000 mg | INTRAMUSCULAR | Status: DC | PRN
  Administered 2019-12-21: 0.5 mg via INTRAVENOUS
  Filled 2019-12-21: qty 1

## 2019-12-21 MED ORDER — INSULIN ASPART 100 UNIT/ML ~~LOC~~ SOLN
2.0000 [IU] | SUBCUTANEOUS | Status: DC
  Administered 2019-12-21: 21:00:00 4 [IU] via SUBCUTANEOUS
  Administered 2019-12-21: 12:00:00 2 [IU] via SUBCUTANEOUS
  Administered 2019-12-21: 08:00:00 6 [IU] via SUBCUTANEOUS
  Administered 2019-12-22 (×2): 2 [IU] via SUBCUTANEOUS
  Administered 2019-12-22: 21:00:00 4 [IU] via SUBCUTANEOUS

## 2019-12-21 MED ORDER — GUAIFENESIN-DM 100-10 MG/5ML PO SYRP: 15.0000 mL | ORAL_SOLUTION | ORAL | Status: DC | PRN

## 2019-12-21 MED ORDER — IODIXANOL 320 MG/ML IV SOLN
INTRAVENOUS | Status: DC | PRN
  Administered 2019-12-21: 01:00:00 130 mL

## 2019-12-21 MED ORDER — PANTOPRAZOLE SODIUM 40 MG PO TBEC
40.0000 mg | DELAYED_RELEASE_TABLET | Freq: Every day | ORAL | Status: DC
  Administered 2019-12-21 – 2019-12-23 (×3): 40 mg via ORAL
  Filled 2019-12-21 (×3): qty 1

## 2019-12-21 MED ORDER — SODIUM BICARBONATE 8.4 % IV SOLN
100.0000 meq | Freq: Once | INTRAVENOUS | Status: AC
  Administered 2019-12-21: 12:00:00 100 meq via INTRAVENOUS
  Filled 2019-12-21: qty 50

## 2019-12-21 MED ORDER — TRAMADOL HCL 50 MG PO TABS
50.0000 mg | ORAL_TABLET | Freq: Two times a day (BID) | ORAL | Status: DC | PRN
  Administered 2019-12-22 (×2): 50 mg via ORAL
  Filled 2019-12-21 (×2): qty 1

## 2019-12-21 MED ORDER — POTASSIUM CHLORIDE CRYS ER 20 MEQ PO TBCR: 20.0000 meq | EXTENDED_RELEASE_TABLET | Freq: Every day | ORAL | Status: DC | PRN

## 2019-12-21 NOTE — Plan of Care (Signed)

## 2019-12-21 NOTE — Transfer of Care (Signed)
Immediate Anesthesia Transfer of Care Note  Patient: Thomas Snow  Procedure(s) Performed: Left Lower extrimity thrombectomy. (Left Leg Lower) Left LOWER EXTREMITY Arteriogram (Left Leg Lower) Left Common Femoral to Below knee Popliteal bypass using 25mm Propaten graft. (Left Leg Lower) Endarterectomy of left  below knee POPLITEAL to tibial peroneal trunk with bovine paracardial patch angioplasty. (Left Leg Lower)  Patient Location: PACU  Anesthesia Type:General  Level of Consciousness: awake and alert   Airway & Oxygen Therapy: Patient Spontanous Breathing and Patient connected to face mask oxygen  Post-op Assessment: Report given to RN, Post -op Vital signs reviewed and stable and Patient moving all extremities X 4  Post vital signs: Reviewed and stable  Last Vitals:  Vitals Value Taken Time  BP 116/79 12/21/19 0052  Temp    Pulse 100 12/21/19 0103  Resp 18 12/21/19 0103  SpO2 100 % 12/21/19 0103  Vitals shown include unvalidated device data.  Last Pain:  Vitals:   12/20/19 1209  TempSrc:   PainSc: 0-No pain         Complications: No complications documented.

## 2019-12-21 NOTE — Op Note (Signed)
Date: December 21, 2019  Preoperative diagnosis: Acute on chronic limb ischemia of the left lower extremity  Postoperative diagnosis: Same  Procedure: 1.  Left lower extremity arteriogram 2.  Left below-knee popliteal artery, TP trunk, peroneal and posterior tibial artery thrombectomy 3.  Left common femoral to below-knee popliteal artery bypass with 6 mm ringed PTFE graft 4.  Left below-knee popliteal artery and tibioperoneal trunk endarterectomy with bovine pericardial patch angioplasty  Surgeon: Dr. Marty Heck, MD  Assistant: Paulo Fruit, PA  Indications: Patient is a 85 year old male with advanced dementia who presented to the ED today for initial work-up of altered mental status.  He was ultimately found to have pale left leg with no Doppler signals or pulses.  He has previously undergone left SFA stenting with Viabahn stents by Dr. Gwenlyn Found in 2018 for left SFA occlusion.  He presented to the OR emergently after risk benefits discussed including high risk for limb loss.  An assistant was needed for exposure and expedite the case.  Findings: Initial arteriogram showed occluded left SFA stents as well as occluded popliteal artery and only reconstitution of a mid peroneal artery island with no runoff.  His previous arteriogram imaging from 2018 showed a patent below-knee popliteal artery distal to the Viabahn stents with three-vessel runoff.  My initial plan was left common femoral to below-knee popliteal bypass given I suspected the popliteal artery was just underfilled from acute occlusion of the Viabahn stents.  Proximal anastomosis was sewn end-to-side to the common femoral artery and we then opened the below-knee popliteal artery but there was subacute thrombus in the below-knee popliteal with no backbleeding.  I subsequently attempted to pass a Fogarty catheters down the below-knee popliteal artery into the tibials and initially got a large plug of subacute appearing thrombus.  On  further attempts I could not get the catheter to pass down the anterior tibial or posterior tibial and only about 20 cm down the peroneal.  I went ahead and sewed the distal anastomosis but ultimately we had a occlusive signal distally.  I then shot an arteriogram that showed there was no outflow for the bypass and the TP trunk was occluded as well as the anterior tibial.  I then elected to open the tibioperoneal trunk which had a chronic occlusion and I performed endarterectomy here and got good inflow from the bypass.  This was patched with bovine pericardial patch.  I had backbleeding from the peroneal but again could not get a catheter to pass down the posterior tibial.  I shot another arteriogram image from the distal bypass and there appeared to be thrombus in the distal bypass so I reopened the patch and ultimately retrieved some additional thrombus and diseased intima from the endarterectomy.  At the completion of the case he had a posterior tibial signal.  Anesthesia: General  Details: Patient was taken to the operating room after informed consent was obtained.  Placed on operative table supine position.  Anesthesia was induced.  Antibiotics were given.  Timeout was performed.  Initially made a horizontal groin incision over the left common femoral artery pulse.  Dissected down Bovie cautery open the femoral sheath longitudinal.  Got control of all the circumflex branches and then got Vesseloops proximal and distal in the common femoral artery.  There was plaque posterior but anteriorly it was very nice and soft.  I then went ahead and accessed this with a micro access needle and a microwire and micro sheath and shot a left lower  extremity arteriogram.  This showed his SFA Viabahn stents as well as the popliteal artery all occluded with only reconstitution of a small island of peroneal artery.  I had reviewed his arteriogram images from 2018 and I thought that his below-knee popliteal artery was likely  patent and that this was just underfilled from acute occlusion of his stents.  I went ahead and cut down on the below-knee popliteal artery from medial approach down to the popliteal space and dissected the artery proximally and distally.  I also ligated the anterior tibial veins and got out the tibioperoneal trunk and anterior tibial artery controlled these with Vesseloops.  I then passed a Gore tunneler from the below-knee popliteal artery up to the groin subfascial subsartorial.  Patient was then given 100 units per kilogram heparin.  A 6 mm ringed PTFE graft was brought on the field and tunneled without twisting it.  I then controlled the common femoral artery with a Henley clamp and Vesseloops and ultimately common femoral artery was opened 11blade scalpel extended with Potts scissors the graft spatulated and end-to-side anastomosis was sewn to the left common femoral artery.  The graft had excellent pulsatile inflow.  I then went ahead and got the below-knee popliteal artery controlled with Vesseloops and this was opened with 11 blade scalpel extended with Potts scissors.  There was thrombus that appeared subacute in the below-knee popliteal artery and no backbleeding from any of the distal tibials.  I extended the arteriotomy down to the takeoff of the anterior tibial.  Then elected to pass #2 and #3 Fogarty catheters and I could only pass about 20 cm before meeting resistance but I was able to retrieve several large plugs of acute to subacute thrombus from the TP trunk.  Ultimately there was still not very good backbleeding and given the resistance passing the Fogarty catheters I elected to pass a manual dilators and passed #2 and #2.5 manual dilators down the TP trunk.  That point time I did have some backbleeding.  The leg was straightened and the graft was cut to the appropriate length spatulated and end-to-side anastomosis was sewn to the below-knee popliteal artery with 6-0 Prolene.  Once we came off  clamps there was a pulse in the graft that we had an occlusive signal in the TP trunk.  I then accessed the graft distally with micro access needle placed a micro sheath and ultimately arteriogram image was obtained that showed no outflow of the graft and both the anterior tibial and TP trunk were occluded.  There was obviously no outflow to support the graft.  That point time I elected to open the TP trunk with 11 blade scalpel extended with Potts scissors and endarterectomy was performed from the hood of the bypass all the way down to the bifurcation of the peroneal and posterior tibial artery.  Large calcified plaque was endarterectomized and I was able to get a Fogarty to pass about 20 cm down the peroneal but would not pass down the posterior tibial.  I did get some backbleeding from the peroneal.  I had excellent pulsatile inflow at this point into TP trunk from bypass.  A bovine pericardial patch was then trimmed and sewn to the TP trunk with 6-0 Prolene in parachute technique.  I then had an excellent pulse in the TP trunk distal to the bypass.  I elected to do another and this showed what looked like thrombus in the distal hood of the bypass versus residual intima from  endarterectomy.  That point time I then reopened the patch several times and shot several additional arteriogram images retrieving plugs of thrombus some of which appeared acute subacute.  I was finally satisfied and could easily pass a #4 dilator across the anastomosis onto the below-knee popliteal artery and across the TP trunk and I did not see any other pathology that would be flow-limiting.  There was also excellent inflow.  There was still backbleeding from peroneal. The patch was repaired with 6-0 prolene. Satisfied with the results patient was given protamine.  Both wounds were washed out groin was closed multiple layers of 2-0 Vicryl 3-0 Vicryl 4-0 Monocryl Dermabond.  Below knee popliteal artery was closed with 2-0 Vicryl 3-0 Vicryl  4-0 Monocryl Dermabond.  Taken to PACU in stable condition.  Complication: None  Condition: Stable  Marty Heck, MD [facility] Office: [place]

## 2019-12-21 NOTE — Anesthesia Postprocedure Evaluation (Signed)
Anesthesia Post Note  Patient: KARDER GOODIN  Procedure(s) Performed: Left Lower extrimity thrombectomy. (Left Leg Lower) Left LOWER EXTREMITY Arteriogram (Left Leg Lower) Left Common Femoral to Below knee Popliteal bypass using 72mm Propaten graft. (Left Leg Lower) Endarterectomy of left  below knee POPLITEAL to tibial peroneal trunk with bovine paracardial patch angioplasty. (Left Leg Lower)     Patient location during evaluation: PACU Anesthesia Type: General Level of consciousness: awake and alert Pain management: pain level controlled Vital Signs Assessment: post-procedure vital signs reviewed and stable Respiratory status: spontaneous breathing, nonlabored ventilation, respiratory function stable and patient connected to nasal cannula oxygen Cardiovascular status: blood pressure returned to baseline and stable Postop Assessment: no apparent nausea or vomiting Anesthetic complications: no Comments: Alternating hypotension-hypertension in PACU consistent with OR. Continue to monitor. No mentation changes.    No complications documented.  Last Vitals:  Vitals:   12/21/19 0412 12/21/19 0500  BP: (!) 143/54 (!) 138/53  Pulse: 93 95  Resp: 12 (!) 22  Temp: 36.4 C   SpO2:  100%    Last Pain:  Vitals:   12/21/19 0412  TempSrc: Axillary  PainSc:                  Effie Berkshire

## 2019-12-21 NOTE — Progress Notes (Addendum)
Progress Note    12/21/2019 7:02 AM 1 Day Post-Op  Subjective:  Does not really follow commands.  Screams with pain when touched behind his knee.  RN reports he received 0.5mg  of Dilaudid x 1 last night  Afebrile HR 90's-100's NSR 829'H-371'I systolic 967% RA  Vitals:   12/21/19 0500 12/21/19 0600  BP: (!) 138/53 (!) 165/61  Pulse: 95 96  Resp: (!) 22 18  Temp:    SpO2: 100% 100%    Physical Exam: Cardiac:  regular Lungs:  Non labored Incisions:  Left groin and left below knee incisions are clean Extremities:  Unable to doppler left DP/PT/peroneal; has brisk left femoral doppler signal   CBC    Component Value Date/Time   WBC 13.3 (H) 12/20/2019 1226   RBC 4.86 12/20/2019 1226   HGB 9.9 (L) 12/21/2019 0020   HCT 29.0 (L) 12/21/2019 0020   PLT 349 12/20/2019 1226   MCV 87.7 12/20/2019 1226   MCH 27.6 12/20/2019 1226   MCHC 31.5 12/20/2019 1226   RDW 13.2 12/20/2019 1226   LYMPHSABS 1.0 12/20/2019 1226   MONOABS 0.6 12/20/2019 1226   EOSABS 0.1 12/20/2019 1226   BASOSABS 0.1 12/20/2019 1226    BMET    Component Value Date/Time   NA 140 12/21/2019 0020   K 4.0 12/21/2019 0020   CL 104 12/21/2019 0020   CO2 25 12/20/2019 1226   GLUCOSE 174 (H) 12/21/2019 0020   BUN 14 12/21/2019 0020   CREATININE 1.40 (H) 12/21/2019 0020   CREATININE 1.60 (H) 05/30/2016 1122   CALCIUM 8.9 12/20/2019 1226   GFRNONAA 33 (L) 12/20/2019 1226   GFRNONAA 40 (L) 05/30/2016 1122   GFRAA 31 (L) 07/04/2018 1120   GFRAA 46 (L) 05/30/2016 1122    INR    Component Value Date/Time   INR 1.0 12/20/2019 1226     Intake/Output Summary (Last 24 hours) at 12/21/2019 8938 Last data filed at 12/21/2019 0600 Gross per 24 hour  Intake 3371.89 ml  Output 1800 ml  Net 1571.89 ml     Assessment:  85 y.o. male is s/p:  1.  Left lower extremity arteriogram 2.  Left below-knee popliteal artery, TP trunk, peroneal and posterior tibial artery thrombectomy 3.  Left common femoral  to below-knee popliteal artery bypass with 6 mm ringed PTFE graft 4.  Left below-knee popliteal artery and tibioperoneal trunk endarterectomy with bovine pericardial patch angioplasty  1 Day Post-Op  Plan: -unable to obtain doppler signals left foot.   Brisk left femoral doppler signal.  He did not have any signals in the foot in the recovery room per Dr. Donley Redder further surgically for revascularization.  Pt at high risk for amputation.  -left calf is soft -incisions look fine -DVT prophylaxis:  Sq heparin to start this am-will d/w Dr. Carlis Abbott if pt needs to be on heparin gtt.  Pt on Plavix/asa -2D echo has been ordered.   [name], PA-C [facility] [phone] 12/21/2019 7:02 AM  I have seen and evaluated the patient. I agree with the PA note as documented above.  85 year old male that underwent complex revascularization of the left leg last night.  He initially presented to the ED with altered mental status and has very advanced dementia.  After very prolonged visit in the ED was noted to have a pale left foot with no signals.  Very difficult history given his advanced dementia.  Ultimately arteriogram showed occluded left SFA Viabahn stents with really no runoff.  I figured that his popliteal was just underfilled from acute occlusion of the stents given previous arteriogram images that showed patent BK pop with 3 vessel runoff in 2018.  We performed a left common femoral to below-knee popliteal bypass but essentially had no outflow in the bypass.  The tibials appeared chronically diseased and I could only get a catheter about 20 cm down the peroneal.  Ultimately had no outflow in the bypass and did a TP trunk endarterectomy with patch angioplasty distal to the bypass.  This morning he does have a very brisk popliteal signal in the graft but no signal in the foot.  He has essentially no outflow.  He has no further options for revascularization which I discussed with  his wife.  We will monitor his foot and ultimately may require above-knee amputation unless his symptoms are tolerable.  We will check labs today.  Potentially move to the floor this afternoon.  Marty Heck, MD [facility] [place] Office: [phone]

## 2019-12-21 NOTE — Evaluation (Addendum)
Occupational Therapy Evaluation Patient Details Name: Thomas Snow MRN: [id number] DOB: 02-Dec-1934 Today's Date: 12/21/2019    History of Present Illness 85 yo male presenting to ED with AMS and found to have acute limb ischemia LLE. S/p LLE arteriogram, thrombectomy, popliteal bypass, popliteal/tibioperoneal trunk endarterectomy with bovine pericardial patch angioplasty. CT head neg for acute change. PMH including dementia, PAD, CAD, and AF/SS s/p PPM   Clinical Impression   PTA, pt was living with his wife who supervised and assisted with ADLs due to baseline cognitive deficits; pt performing functional mobility around home without DME. Pt currently requiring Min A for UB ADLs, Mod-Max A for LB ADLs, and Min A +2 for functional transfers with RW. Pt will require further acute OT to facilitate safe dc. Pending pt's progress with functional mobility, recommend dc to home to optimize environment with [facility] aide and [place] for further OT to optimize safety, independence with ADLs, and return to PLOF.     Follow Up Recommendations  Home health OT;Supervision/Assistance - 24 hour;Other (comment) ([place] PCA)    Equipment Recommendations  3 in 1 bedside commode    Recommendations for Other Services PT consult     Precautions / Restrictions Precautions Precautions: Fall;Other (comment) (vitals)      Mobility Bed Mobility Overal bed mobility: Needs Assistance Bed Mobility: Supine to Sit;Sit to Supine     Supine to sit: Max assist;+2 for physical assistance Sit to supine: Min guard   General bed mobility comments: Max A +2 for bringing BLEs to EOB and elevate trunk. Min Guard A for safety with returning to supine    Transfers Overall transfer level: Needs assistance Equipment used: Rolling walker (2 wheeled) Transfers: Sit to/from Stand Sit to Stand: Min assist;+2 physical assistance;From elevated surface         General transfer comment: MIn A +2 for power up and gaining balance  in standing. Pt performing twice    Balance Overall balance assessment: Needs assistance Sitting-balance support: No upper extremity supported;Feet supported Sitting balance-Leahy Scale: Fair     Standing balance support: During functional activity;Bilateral upper extremity supported Standing balance-Leahy Scale: Poor Standing balance comment: Reliant on UE support and physical A. Pt maintaining NWB/TDWB at LLE due to pain                           ADL either performed or assessed with clinical judgement   ADL Overall ADL's : Needs assistance/impaired Eating/Feeding: Set up;Sitting   Grooming: Set up;Supervision/safety;Sitting   Upper Body Bathing: Minimal assistance;Sitting   Lower Body Bathing: Moderate assistance;+2 for physical assistance;Sit to/from stand   Upper Body Dressing : Minimal assistance;Sitting   Lower Body Dressing: Maximal assistance;Bed level Lower Body Dressing Details (indicate cue type and reason): Max A to don socks at bed level. Toilet Transfer: Minimal assistance;+2 for physical assistance;+2 for safety/equipment;RW Toilet Transfer Details (indicate cue type and reason): Min A +2 for power up at EOB.         Functional mobility during ADLs: Minimal assistance;+2 for physical assistance;Rolling walker (Sit<>stand) General ADL Comments: Pt presenting with decreased balance, strength, and activity tolerance     Vision Baseline Vision/History: Wears glasses Wears Glasses: At all times Patient Visual Report: No change from baseline       Perception     Praxis      Pertinent Vitals/Pain Pain Assessment: Faces Faces Pain Scale: Hurts little more Pain Location: LLE Pain Descriptors / Indicators: Constant;Grimacing;Discomfort Pain  Intervention(s): Monitored during session;Limited activity within patient's tolerance;Repositioned     Hand Dominance Right   Extremity/Trunk Assessment Upper Extremity Assessment Upper Extremity  Assessment: Overall WFL for tasks assessed   Lower Extremity Assessment Lower Extremity Assessment: Defer to PT evaluation   Cervical / Trunk Assessment Cervical / Trunk Assessment: Kyphotic   Communication Communication Communication: No difficulties   Cognition Arousal/Alertness: Awake/alert Behavior During Therapy: WFL for tasks assessed/performed Overall Cognitive Status: History of cognitive impairments - at baseline                                 General Comments: Baseline dementia. Following simple commands. Able to distract away from pain.   General Comments  86 bpm, 100% RA, 127/48    Exercises     Shoulder Instructions      Home Living Family/patient expects to be discharged to:: Private residence Living Arrangements: Spouse/significant other Available Help at Discharge: Family;Available 24 hours/day Type of Home: House Home Access: Stairs to enter [employer] of Steps: 3 Entrance Stairs-Rails:  (holds storm door) Home Layout: One level     Bathroom Shower/Tub: Teacher, early years/pre: Standard     Home Equipment: None          Prior Functioning/Environment Level of Independence: Needs assistance  Gait / Transfers Assistance Needed: Walked by himself PTA ADL's / Homemaking Assistance Needed: Wife assisted with bahting and dressing for cognitive reasons            OT Problem List: Decreased activity tolerance;Decreased strength;Impaired balance (sitting and/or standing);Decreased range of motion;Decreased cognition;Decreased safety awareness;Decreased knowledge of use of DME or AE;Decreased knowledge of precautions;Cardiopulmonary status limiting activity;Pain      OT Treatment/Interventions: Self-care/ADL training;Therapeutic exercise;Energy conservation;DME and/or AE instruction;Therapeutic activities;Patient/family education    OT Goals(Current goals can be found in the care plan section) Acute Rehab OT  Goals Patient Stated Goal: Wife would like help at home and is agreeable to SNF if pt unable to perform mobility OT Goal Formulation: With patient/family Time For Goal Achievement: 01/04/20 Potential to Achieve Goals: Good  OT Frequency: Min 2X/week   Barriers to D/C:            Co-evaluation PT/OT/SLP Co-Evaluation/Treatment: Yes Reason for Co-Treatment: Complexity of the patient's impairments (multi-system involvement);Necessary to address cognition/behavior during functional activity;To address functional/ADL transfers;For patient/therapist safety   OT goals addressed during session: ADL's and self-care      AM-PAC OT "6 Clicks" Daily Activity     Outcome Measure Help from another person eating meals?: A Little Help from another person taking care of personal grooming?: A Little Help from another person toileting, which includes using toliet, bedpan, or urinal?: A Lot Help from another person bathing (including washing, rinsing, drying)?: A Lot Help from another person to put on and taking off regular upper body clothing?: A Little Help from another person to put on and taking off regular lower body clothing?: A Lot 6 Click Score: 15   End of Session Equipment Utilized During Treatment: Rolling walker Nurse Communication: Mobility status  Activity Tolerance: Patient tolerated treatment well;Patient limited by pain Patient left: in bed;with call bell/phone within reach;with bed alarm set;with family/visitor present  OT Visit Diagnosis: Unsteadiness on feet (R26.81);Other abnormalities of gait and mobility (R26.89);Muscle weakness (generalized) (M62.81);Pain Pain - Right/Left: Left Pain - part of body: Leg  Time: 0415-9301 OT Time Calculation (min): 17 min Charges:  OT General Charges $OT Visit: 1 Visit OT Evaluation $OT Eval Moderate Complexity: [place], OTR/L Acute Rehab Pager: [phone] Office: [place] 12/21/2019, 2:28 PM

## 2019-12-21 NOTE — Progress Notes (Signed)
  Echocardiogram 2D Echocardiogram has been performed.  Thomas Snow 12/21/2019, 3:07 PM

## 2019-12-21 NOTE — Evaluation (Signed)
Physical Therapy Evaluation Patient Details Name: Thomas Snow MRN: [id number] DOB: 1934-04-15 Today's Date: 12/21/2019   History of Present Illness  85 yo male presenting to ED with AMS and found to have acute limb ischemia LLE. S/p LLE arteriogram, thrombectomy, popliteal bypass, popliteal/tibioperoneal trunk endarterectomy with bovine pericardial patch angioplasty. CT head neg for acute change. PMH including dementia, PAD, CAD, and AF/SS s/p PPM  Clinical Impression  Pt admitted with above diagnosis. Pt was able to stand to RW with min assist of 2 for safety to RW due to pt puts no weight on left LE due to pain. Pt limited by pain.  Pt with dementia and has difficulty following commands. Wife is concerned that she cant care for pt at current level. Will follow acutely. Pt currently with functional limitations due to the deficits listed below (see PT Problem List). Pt will benefit from skilled PT to increase their independence and safety with mobility to allow discharge to the venue listed below.      Follow Up Recommendations SNF;Supervision/Assistance - 24 hour    Equipment Recommendations  Rolling walker with 5" wheels;3in1 (PT)    Recommendations for Other Services       Precautions / Restrictions Precautions Precautions: Fall;Other (comment) (vitals) Restrictions Weight Bearing Restrictions: No      Mobility  Bed Mobility Overal bed mobility: Needs Assistance Bed Mobility: Supine to Sit;Sit to Supine     Supine to sit: Max assist;+2 for physical assistance Sit to supine: Min guard   General bed mobility comments: Max A +2 for bringing BLEs to EOB and elevate trunk. Min Guard A for safety with returning to supine    Transfers Overall transfer level: Needs assistance Equipment used: Rolling walker (2 wheeled) Transfers: Sit to/from Stand Sit to Stand: Min assist;+2 physical assistance;From elevated surface         General transfer comment: MIn A +2 for power  up and gaining balance in standing. Pt performing twice for up to 30 seconds each time.  Ambulation/Gait                Stairs            Wheelchair Mobility    Modified Rankin (Stroke Patients Only)       Balance Overall balance assessment: Needs assistance Sitting-balance support: No upper extremity supported;Feet supported Sitting balance-Leahy Scale: Fair     Standing balance support: During functional activity;Bilateral upper extremity supported Standing balance-Leahy Scale: Poor Standing balance comment: Reliant on UE support and physical A. Pt maintaining NWB/TDWB at LLE due to pain                             Pertinent Vitals/Pain Pain Assessment: Faces Faces Pain Scale: Hurts little more Pain Location: LLE Pain Descriptors / Indicators: Constant;Grimacing;Discomfort Pain Intervention(s): Limited activity within patient's tolerance;Monitored during session;Repositioned    Home Living Family/patient expects to be discharged to:: Private residence Living Arrangements: Spouse/significant other Available Help at Discharge: Family;Available 24 hours/day Type of Home: House Home Access: Stairs to enter Entrance Stairs-Rails:  (holds storm door) Technical brewer of Steps: 3 Home Layout: One level Home Equipment: None      Prior Function Level of Independence: Needs assistance   Gait / Transfers Assistance Needed: Walked by himself PTA  ADL's / Homemaking Assistance Needed: Wife assisted with bahting and dressing for cognitive reasons        Hand Dominance   Dominant Hand:  Right    Extremity/Trunk Assessment   Upper Extremity Assessment Upper Extremity Assessment: Defer to OT evaluation    Lower Extremity Assessment Lower Extremity Assessment: Generalized weakness    Cervical / Trunk Assessment Cervical / Trunk Assessment: Kyphotic  Communication   Communication: No difficulties  Cognition Arousal/Alertness:  Awake/alert Behavior During Therapy: WFL for tasks assessed/performed Overall Cognitive Status: History of cognitive impairments - at baseline                                 General Comments: Baseline dementia. Following simple commands. Able to distract away from pain.      General Comments General comments (skin integrity, edema, etc.): 86 bpm, 100% RA, 127/48    Exercises General Exercises - Lower Extremity Long Arc Quad: AROM;Both;10 reps;Seated   Assessment/Plan    PT Assessment Patient needs continued PT services  PT Problem List Decreased balance;Decreased mobility;Decreased activity tolerance;Decreased knowledge of use of DME;Decreased safety awareness;Decreased knowledge of precautions;Cardiopulmonary status limiting activity;Pain;Decreased skin integrity       PT Treatment Interventions DME instruction;Gait training;Functional mobility training;Therapeutic activities;Therapeutic exercise;Balance training;Patient/family education    PT Goals (Current goals can be found in the Care Plan section)  Acute Rehab PT Goals Patient Stated Goal: Wife would like help at home and is agreeable to SNF if pt unable to perform mobility PT Goal Formulation: With patient Time For Goal Achievement: 01/04/20 Potential to Achieve Goals: Good    Frequency Min 3X/week   Barriers to discharge Decreased caregiver support      Co-evaluation PT/OT/SLP Co-Evaluation/Treatment: Yes Reason for Co-Treatment: Complexity of the patient's impairments (multi-system involvement);For patient/therapist safety PT goals addressed during session: Mobility/safety with mobility OT goals addressed during session: ADL's and self-care       AM-PAC PT "6 Clicks" Mobility  Outcome Measure Help needed turning from your back to your side while in a flat bed without using bedrails?: A Little Help needed moving from lying on your back to sitting on the side of a flat bed without using bedrails?:  A Lot Help needed moving to and from a bed to a chair (including a wheelchair)?: Total Help needed standing up from a chair using your arms (e.g., wheelchair or bedside chair)?: A Lot Help needed to walk in hospital room?: Total Help needed climbing 3-5 steps with a railing? : Total 6 Click Score: 10    End of Session Equipment Utilized During Treatment: Gait belt Activity Tolerance: Patient limited by fatigue Patient left: in bed;with call bell/phone within reach;with family/visitor present Nurse Communication: Mobility status PT Visit Diagnosis: Muscle weakness (generalized) (M62.81);Pain Pain - Right/Left: Left Pain - part of body: Leg    Time: 1694-5038 PT Time Calculation (min) (ACUTE ONLY): 17 min   Charges:   PT Evaluation $PT Eval Moderate Complexity: 1 Mod          Ajit Errico W,PT Acute Rehabilitation Services Pager:  [phone]  Office:  [phone]    Denice Paradise 12/21/2019, 3:39 PM

## 2019-12-21 NOTE — Consult Note (Addendum)
NAME:  Thomas Snow, MRN:  [id number], DOB:  October 30, 1934, LOS: 1 ADMISSION DATE:  12/20/2019, CONSULTATION DATE:  12/21/19 REFERRING MD:  Carlis Abbott, CHIEF COMPLAINT:  Shock  Brief History   85yM with extensive PAD s/p attempted bypass with labile BP perioperatively, postoperatively  History of present illness   85yM with extensive PAD s/p attempted bypass with labile BP perioperatively, postoperatively. He was in his USOH (can feed himself but otherwise limited in ADLs and can answer simple yes/no at baseline, only oriented to self and not oriented to place or situation) until around 11am day of admission he looked suddenly confused according to his wife and he howled in pain from his LLE. She says it took a great deal of effort to get him dressed afterward and moving around. In the ED he was found to have acute limb ischemia LLE and he was taken for LLE arteriogram, thrombectomy, popliteal bypass, popliteal/tibioperoneal trunk endarterectomy with bovine pericardial patch angioplasty. Surgery aware distal LLE remains cool and without dopplerable dp/pt pulses.  2L given intraoperatively and 1U pRBC. 1L charted EBL.   Past Medical History  Dementia PAD AF/SSS s/p PPM CAD  Significant Hospital Events   12/21/19 LLE arteriogram, thrombectomy, popliteal bypass, popliteal/tibioperoneal trunk endarterectomy with bovine pericardial patch angioplasty  Consults:  Vascular surgery PCCM  Procedures:  12/21/19 LLE arteriogram, thrombectomy, popliteal bypass, popliteal/tibioperoneal trunk endarterectomy with bovine pericardial patch angioplasty  Significant Diagnostic Tests:  Arteriogram as above  Micro Data:  None  Antimicrobials:  Ancef perioperatively  Interim history/subjective:  n/a  Objective   Blood pressure (!) 160/51, pulse 88, temperature (!) 97.1 F (36.2 C), resp. rate 19, SpO2 100 %.        Intake/Output Summary (Last 24 hours) at 12/21/2019 0140 Last data filed at  12/21/2019 0103 Gross per 24 hour  Intake 3135 ml  Output 1450 ml  Net 1685 ml   There were no vitals filed for this visit.  Examination: General: alert/oriented to person only HENT: NCAT, dry MM Lungs: CTAB, normal work of breathing Cardiovascular: RRR, systolic murmur heard best over RUSB, no JVD  Abdomen: soft, nontender, normal bowel sounds Extremities: LLE is cooler than right but per bedside RN, has warmed slightly since procedure. No dopplerable pulse LLE at dp or pt. There is dopplerable R pt. Radial palpable bilaterally. Incisions look clean and there is no surrounding hematoma at either site.  Neuro: does not answer questions appropriately, grossly nonfocal, follows commands  Resolved Hospital Problem list   n/a  Assessment & Plan:   # Labile blood pressure: may be result of hypovolemia and difficulty achieving reliable cuff pressures with extent of upper and lower extremity large vessel vasculopathy. In clinic only able to get R arm BP which at baseline is normal however here LUE seems a bit more reliable with mildly HTNsive BPs. - trend cbc - check tte given murmur heard best over RUSB - if persistently low BP then may need to entertain arterial line, obtain BCx, UA and empiric ABX   # Acute limb ischemia LLE s/p bypass: - pulse checks per vascular, anticipate that he may require amputation for definitive intervention during hospitalization depending on goals of care - pulse checks per vascular surgery  # Acute encephalopathy: with baseline as documented above. - check tsh, b12 - if hasn't gotten back to baseline over course of the day in discussion with pt's wife could consider CTA head and neck or MR brain flair protocol to evaluate for stroke however  he has had grossly nonfocal exam  Best practice:  Diet: NPO Pain/Anxiety/Delirium protocol (if indicated): no VAP protocol (if indicated): no DVT prophylaxis: per vascular surgery GI prophylaxis: not  indicated Glucose control: check with AM labs Mobility: bed level Code Status: Full, confirmed with wife Family Communication: updated at bedside  Disposition: 2H  Labs   CBC: Recent Labs  Lab 12/20/19 1226 12/20/19 1227 12/20/19 2242 12/21/19 0020  WBC 13.3*  --   --   --   NEUTROABS 11.5*  --   --   --   HGB 13.4 14.3 9.5* 9.9*  HCT 42.6 42.0 28.0* 29.0*  MCV 87.7  --   --   --   PLT 349  --   --   --     Basic Metabolic Panel: Recent Labs  Lab 12/20/19 1226 12/20/19 1227 12/20/19 2242 12/21/19 0020  NA 140 138 140 140  K 4.5 4.3 3.7 4.0  CL 101 101 104 104  CO2 25  --   --   --   GLUCOSE 232* 223* 156* 174*  BUN 17 20 14 14   CREATININE 1.98* 1.80* 1.50* 1.40*  CALCIUM 8.9  --   --   --    GFR: CrCl cannot be calculated (Unknown ideal weight.). Recent Labs  Lab 12/20/19 1226 12/20/19 1729  WBC 13.3*  --   LATICACIDVEN  --  2.6*    Liver Function Tests: Recent Labs  Lab 12/20/19 1226  AST 19  ALT 15  ALKPHOS 79  BILITOT 0.3  PROT 6.7  ALBUMIN 3.3*   No results for input(s): LIPASE, AMYLASE in the last 168 hours. No results for input(s): AMMONIA in the last 168 hours.  ABG    Component Value Date/Time   TCO2 22 12/21/2019 0020     Coagulation Profile: Recent Labs  Lab 12/20/19 1226  INR 1.0    Cardiac Enzymes: Recent Labs  Lab 12/20/19 1729  CKTOTAL 45*    HbA1C: Hgb A1c MFr Bld  Date/Time Value Ref Range Status  02/06/2014 06:32 PM 7.0 (H) 4.8 - 5.6 % Final    Comment:    (NOTE)         Pre-diabetes: 5.7 - 6.4         Diabetes: >6.4         Glycemic control for adults with diabetes: <7.0     CBG: Recent Labs  Lab 12/21/19 0107  GLUCAP 191*    Review of Systems:   unable to obtain, unreliable  Past Medical History  He,  has a past medical history of AICD (automatic cardioverter/defibrillator) present, Arthritis, Carotid artery occlusion, Chronic lower back pain, Coronary artery disease, Dementia ([place]), GERD  (gastroesophageal reflux disease), History of kidney stones, Hyperlipidemia, Hypertension, Pneumonia, and Urinary hesitancy.   Surgical History    Past Surgical History:  Procedure Laterality Date  . BACK SURGERY    . CARDIAC CATHETERIZATION    . CARDIAC CATHETERIZATION  03/09/1999   RCA high-grade 95% stenosis, inflated with a 3.25 IVT cutting balloon at 6-64, 6-41, 6-52, and 6-53, resulting in reductiong of less than 10%. RCA proximal 75% stenosis inflated with a 2.75x10 IVT cutting balloon at 6-65 then exchanged for a 3x10 IVT cutting balloon inflated at 6-83.  Marland Kitchen CARDIAC PACEMAKER PLACEMENT     [place] '85, '95; [place] '06  . CARDIOVASCULAR STRESS TEST  08/09/2010   Normal pattern of perfusion in all regions. No ECG changes. EKG negative for ischemia.  Marland Kitchen  CAROTID ENDARTERECTOMY Right 09-05-11   cea  . CATARACT EXTRACTION, BILATERAL Bilateral   . CORONARY ANGIOPLASTY    . ENDARTERECTOMY  09/05/2011   Procedure: ENDARTERECTOMY CAROTID;  Surgeon: Mal Misty, MD;  Location: [place];  Service: Vascular;  Laterality: Right;  . ICD GENERATOR CHANGE  10/20/2004   Implantation of [facility], model # 5357M/s, serial # [id number]  . kidney stent Left    left RAS '05  . KIDNEY STONE SURGERY     "between kidney and bladder; cut it then had to open him up to get it back together"  . KNEE SURGERY Right    "forgot why/what they done"  . LOWER EXTREMITY INTERVENTION N/A 06/04/2016   Procedure: Lower Extremity Intervention;  Surgeon: Lorretta Harp, MD;  Location: [place] [facility];  Service: Cardiovascular;  Laterality: N/A;  . LUMBAR [place] SURGERY  1990s  . PERIPHERAL VASCULAR ANGIOGRAM  01/22/2003   Left subclavian 85% fairly focal in-stent restenosis, dilated with a 7x2cm Powerflex balloon at 10-30 and 10-30, resulting in less than 10% residual narrowing. Left renal artery demonstrated 80-85% initially dilated with a 4x15 mm coronary cutting balloon at 10-30 and 12-30,  exchanged for a 82mmx2cm Aviator balloon inflations were done at 6-15, 10-30, and 10-30, resulting less than 10% residual.  . PERIPHERAL VASCULAR ANGIOGRAM  07/13/1999   Left subclavian 85% dilated with a 66mmx2cm Cordis Power-Flex balloon at 6-40, a P-204 iliac stent was hand-crimped on a 42mmx2cm Cordis Power-Flex balloon, expanded at 8-40 with reduction to 0%. Left Renal artery 75-80% stenosis, dilated with a 32mmx1.5cm Cordis Power-Flex balloon at 6-40, reduction of stenosis from 80% to 0%.  Marland Kitchen PERIPHERAL VASCULAR INTERVENTION  06/04/2016   Procedure: Peripheral Vascular Intervention;  Surgeon: Lorretta Harp, MD;  Location: [place] [facility];  Service: Cardiovascular;;  SFA  . RENAL DOPPLER  09/25/2012   Celiac artery and SMA-demonstrates narrowing with increased velocities consistent with greater than 50%, right renal- 1-59% diameter reduction, left renal artery stent- 1-59% diameter reduction.  . TONSILLECTOMY    . TRANSTHORACIC ECHOCARDIOGRAM  09-25-2012   EF 55-60%, mild-moderate tricuspid valve regurg     Social History   reports that he quit smoking about 23 years ago. His smoking use included cigarettes. He has a 12.50 pack-year smoking history. His smokeless tobacco use includes snuff and chew. He reports that he does not drink alcohol and does not use drugs.   Family History   His family history includes Arthritis in his father; Heart disease in his mother.   Allergies Allergies  Allergen Reactions  . Atorvastatin     Leg pain  . Crestor [Rosuvastatin]     Leg pain     Home Medications  Prior to Admission medications   Medication Sig Start Date End Date Taking? Authorizing Provider  clopidogrel (PLAVIX) 75 MG tablet Take 1 tablet by mouth once daily with breakfast Patient taking differently: Take 75 mg by mouth daily. 04/16/19  Yes Croitoru, Mihai, MD  donepezil (ARICEPT) 23 MG TABS tablet Take 23 mg by mouth at bedtime. 03/31/17  Yes [provider]  memantine  (NAMENDA) 10 MG tablet Take 1 tablet (10 mg total) by mouth 2 (two) times daily. 04/16/17  Yes Penumalli, Earlean Polka, MD  mirtazapine (REMERON) 15 MG tablet Take 15 mg by mouth at bedtime.   Yes [provider]  ferrous gluconate (FERGON) 324 MG tablet Take 1 tablet (324 mg total) by mouth daily with breakfast. Patient  not taking: No sig reported 07/04/18   Kathie Dike, MD  pantoprazole (PROTONIX) 40 MG tablet Take 1 tablet (40 mg total) by mouth daily. Patient not taking: No sig reported 07/05/18   Kathie Dike, MD     Critical care time: 31 minutes    This patient is critically ill with shock; which, requires frequent high complexity decision making, assessment, support, evaluation, and titration of therapies. This was completed through the application of advanced monitoring technologies and extensive interpretation of multiple databases. During this encounter critical care time was devoted to patient care services described in this note for 31 minutes.  Letta Median, Pulmonary/Critical Care

## 2019-12-21 NOTE — Progress Notes (Signed)
NAME:  Thomas Snow, MRN:  [id number], DOB:  11/10/1934, LOS: 1 ADMISSION DATE:  12/20/2019, CONSULTATION DATE:  12/21/19 REFERRING MD:  Carlis Abbott, CHIEF COMPLAINT:  Shock  Brief History   85yM with extensive PAD s/p attempted bypass with labile BP perioperatively, postoperatively  History of present illness   85yM with extensive PAD s/p attempted bypass with labile BP perioperatively, postoperatively. He was in his usual state of health (can feed himself but otherwise limited in ADLs and can answer simple yes/no at baseline, only oriented to self and not oriented to place or situation) until around 11am day of admission he looked suddenly confused according to his wife and he howled in pain from his LLE. She says it took a great deal of effort to get him dressed afterward and moving around. In the ED he was found to have acute limb ischemia LLE and he was taken for LLE arteriogram, thrombectomy, popliteal bypass, popliteal/tibioperoneal trunk endarterectomy with bovine pericardial patch angioplasty. Surgery aware distal LLE remains cool and without dopplerable dp/pt pulses.  2L given intraoperatively and 1U pRBC. 1L charted EBL.   Past Medical History  Dementia PAD AF/SSS s/p PPM CAD  Significant Hospital Events   12/21/19 LLE arteriogram, thrombectomy, popliteal bypass, popliteal/tibioperoneal trunk endarterectomy with bovine pericardial patch angioplasty  Consults:  Vascular surgery PCCM  Procedures:  12/21/19 LLE arteriogram, thrombectomy, popliteal bypass, popliteal/tibioperoneal trunk endarterectomy with bovine pericardial patch angioplasty  Significant Diagnostic Tests:  Arteriogram as above  Micro Data:  None  Antimicrobials:  Ancef perioperatively  Interim history/subjective:  12/20: consulted this am. Renal function declining. Cont to monitor bp and mental status.   Objective   Blood pressure (!) 138/50, pulse 87, temperature 97.9 F (36.6 C), temperature source  Oral, resp. rate 15, height 5\' 6"  (1.676 m), weight 42.8 kg, SpO2 100 %.        Intake/Output Summary (Last 24 hours) at 12/21/2019 1003 Last data filed at 12/21/2019 0803 Gross per 24 hour  Intake 3371.89 ml  Output 1975 ml  Net 1396.89 ml   Filed Weights   12/21/19 0245  Weight: 42.8 kg    Examination: General: alert/oriented to person only HENT: NCAT, dry MM Lungs: CTAB, normal work of breathing Cardiovascular: RRR, systolic murmur no JVD  Abdomen: soft, nontender, normal bowel sounds Extremities: LLE is cooler than right but per bedside RN, has warmed slightly since procedure. No dopplerable pulse LLE at dp or pt. There is dopplerable R pt. Radial palpable bilaterally. Incisions look clean and there is no surrounding hematoma at either site.  Neuro: does not answer questions appropriately, grossly nonfocal, follows commands  Resolved Hospital Problem list   n/a  Assessment & Plan:   # Labile blood pressure: may be result of hypovolemia and difficulty achieving reliable cuff pressures with extent of upper and lower extremity large vessel vasculopathy. In clinic only able to get R arm BP which at baseline is normal however here LUE seems a bit more reliable with mildly HTNsive BPs. - trend cbc -echo pending - if persistently low BP then may need to entertain arterial line, obtain BCx, UA and empiric ABX   # Acute limb ischemia LLE s/p bypass: - pulse checks per vascular, anticipate that he may require amputation for definitive intervention during hospitalization depending on goals of care - pulse checks per vascular surgery  # Acute encephalopathy: with baseline as documented above. -  tsh, b12 pending  - if hasn't gotten back to baseline over course of the  day in discussion with pt's wife could consider CTA head and neck or MR brain flair protocol to evaluate for stroke however he has had grossly nonfocal exam  #t2dm with hyperglycemia -ssi  #arf:  #metabolic  acidosis:  -Bicarb -monitor uop -indices rising.  Best practice:  Diet: NPO Pain/Anxiety/Delirium protocol (if indicated): no VAP protocol (if indicated): no DVT prophylaxis: per vascular surgery GI prophylaxis: not indicated Glucose control: ssi Mobility: bed level Code Status: Full, confirmed with wife Family Communication: pending Disposition: 2H  Labs   CBC: Recent Labs  Lab 12/20/19 1226 12/20/19 1227 12/20/19 2242 12/21/19 0020 12/21/19 0802  WBC 13.3*  --   --   --  14.7*  NEUTROABS 11.5*  --   --   --   --   HGB 13.4 14.3 9.5* 9.9* 8.7*  HCT 42.6 42.0 28.0* 29.0* 26.5*  MCV 87.7  --   --   --  86.9  PLT 349  --   --   --  433    Basic Metabolic Panel: Recent Labs  Lab 12/20/19 1226 12/20/19 1227 12/20/19 2242 12/21/19 0020 12/21/19 0802  NA 140 138 140 140 137  K 4.5 4.3 3.7 4.0 3.8  CL 101 101 104 104 106  CO2 25  --   --   --  15*  GLUCOSE 232* 223* 156* 174* 262*  BUN 17 20 14 14 16   CREATININE 1.98* 1.80* 1.50* 1.40* 2.02*  CALCIUM 8.9  --   --   --  7.6*   GFR: Estimated Creatinine Clearance: 16.8 mL/min (A) (by C-G formula based on SCr of 2.02 mg/dL (H)). Recent Labs  Lab 12/20/19 1226 12/20/19 1729 12/21/19 0455 12/21/19 0802  WBC 13.3*  --   --  14.7*  LATICACIDVEN  --  2.6* 1.7  --     Liver Function Tests: Recent Labs  Lab 12/20/19 1226  AST 19  ALT 15  ALKPHOS 79  BILITOT 0.3  PROT 6.7  ALBUMIN 3.3*   No results for input(s): LIPASE, AMYLASE in the last 168 hours. No results for input(s): AMMONIA in the last 168 hours.  ABG    Component Value Date/Time   TCO2 22 12/21/2019 0020     Coagulation Profile: Recent Labs  Lab 12/20/19 1226  INR 1.0    Cardiac Enzymes: Recent Labs  Lab 12/20/19 1729 12/21/19 0455  CKTOTAL 45* 256    HbA1C: Hgb A1c MFr Bld  Date/Time Value Ref Range Status  02/06/2014 06:32 PM 7.0 (H) 4.8 - 5.6 % Final    Comment:    (NOTE)         Pre-diabetes: 5.7 - 6.4          Diabetes: >6.4         Glycemic control for adults with diabetes: <7.0     CBG: Recent Labs  Lab 12/21/19 0107 12/21/19 0259 12/21/19 0807  GLUCAP 191* 239* 250*      Critical care time: The patient is critically ill with multiple organ systems failure and requires high complexity decision making for assessment and support, frequent evaluation and titration of therapies, application of advanced monitoring technologies and extensive interpretation of multiple databases.  Critical care time 35 mins. This represents my time independent of the NPs time taking care of the pt. This is excluding procedures.    Audria Nine DO [facility] 12/21/2019, 10:04 AM

## 2019-12-21 NOTE — Progress Notes (Signed)
PHARMACIST LIPID MONITORING   Thomas Snow is a 85 y.o. male admitted on 12/20/2019 with critical limb ischemia.  Pharmacy has been consulted to optimize lipid-lowering therapy with the indication of secondary prevention for clinical ASCVD.  Recent Labs:  Lipid Panel (last 6 months):   Lab Results  Component Value Date   CHOL 78 12/21/2019   TRIG 87 12/21/2019   HDL 19 (L) 12/21/2019   CHOLHDL 4.1 12/21/2019   VLDL 17 12/21/2019   LDLCALC 42 12/21/2019    Hepatic function panel (last 6 months):   Lab Results  Component Value Date   AST 19 12/20/2019   ALT 15 12/20/2019   ALKPHOS 79 12/20/2019   BILITOT 0.3 12/20/2019    SCr (since admission):   Serum creatinine: 2.02 mg/dL (H) 12/21/19 0802 Estimated creatinine clearance: 16.8 mL/min (A)  Current lipid-lowering therapy: none Previous lipid-lowering therapies (if applicable): Crestor, atorvastatin Documented or reported allergies or intolerances to lipid-lowering therapies (if applicable): Muscle pain  Assessment:  Patient is excluded from the protocol due to LDL at goal and hx dementia  (ESRD, elevated LFTs, pregnancy/breastfeeding, active liver disease)   Follow-up with:  Cardiology provider - Sanda Klein, MD  Follow-up labs after discharge:    Liver function panel and lipid panel in 8-12 weeks then annually  Plan: No adjustments at this time given LDL<50, could consider lipid clinic referral in future if adjustments needed.  Antonietta Jewel, PharmD, [place] Clinical Pharmacist  Phone: [phone] 12/21/2019 3:45 PM  Please check AMION for all [place] phone numbers After 10:00 PM, call [place] [phone]

## 2019-12-21 NOTE — Discharge Instructions (Signed)
 [facility]  Discharge instructions  Lower Extremity Bypass Surgery  Please refer to the following instruction for your post-procedure care. Your surgeon or physician assistant will discuss any changes with you.  Activity  You are encouraged to walk as much as you can. You can slowly return to normal activities during the month after your surgery. Avoid strenuous activity and heavy lifting until your doctor tells you it's OK. Avoid activities such as vacuuming or swinging a golf club. Do not drive until your doctor give the OK and you are no longer taking prescription pain medications. It is also normal to have difficulty with sleep habits, eating and bowel movement after surgery. These will go away with time.  Bathing/Showering  Shower daily after you go home. Do not soak in a bathtub, hot tub, or swim until the incision heals completely.  Incision Care  Clean your incision with mild soap and water. Shower every day. Pat the area dry with a clean towel. You do not need a bandage unless otherwise instructed. Do not apply any ointments or creams to your incision. If you have open wounds you will be instructed how to care for them or a visiting nurse may be arranged for you. If you have staples or sutures along your incision they will be removed at your post-op appointment. You may have skin glue on your incision. Do not peel it off. It will come off on its own in about one week.  Wash the groin wound with soap and water daily and pat dry. (No tub bath-only shower)  Then put a dry gauze or washcloth in the groin to keep this area dry to help prevent wound infection.  Do this daily and as needed.  Do not use Vaseline or neosporin on your incisions.  Only use soap and water on your incisions and then protect and keep dry.  Diet  Resume your normal diet. There are no special food restrictions following this procedure. A low fat/ low cholesterol diet is  recommended for all patients with vascular disease. In order to heal from your surgery, it is CRITICAL to get adequate nutrition. Your body requires vitamins, minerals, and protein. Vegetables are the best source of vitamins and minerals. Vegetables also provide the perfect balance of protein. Processed food has little nutritional value, so try to avoid this.  Medications  Resume taking all your medications unless your doctor or physician assistant tells you not to. If your incision is causing pain, you may take over-the-counter pain relievers such as acetaminophen (Tylenol). If you were prescribed a stronger pain medication, please aware these medication can cause nausea and constipation. Prevent nausea by taking the medication with a snack or meal. Avoid constipation by drinking plenty of fluids and eating foods with high amount of fiber, such as fruits, vegetables, and grains. Take Colace 100 mg (an over-the-counter stool softener) twice a day as needed for constipation.  Do not take Tylenol if you are taking prescription pain medications.  Follow Up  Our office will schedule a follow up appointment 2-3 weeks following discharge.  Please call us immediately for any of the following conditions  Severe or worsening pain in your legs or feet while at rest or while walking Increase pain, redness, warmth, or drainage (pus) from your incision site(s) Fever of 101 degree or higher The swelling in your leg with the bypass suddenly worsens and becomes more painful than when you were in the hospital If you have   been instructed to feel your graft pulse then you should do so every day. If you can no longer feel this pulse, call the office immediately. Not all patients are given this instruction.  Leg swelling is common after leg bypass surgery.  The swelling should improve over a few months following surgery. To improve the swelling, you may elevate your legs above the level of your heart while you are  sitting or resting. Your surgeon or physician assistant may ask you to apply an ACE wrap or wear compression (TED) stockings to help to reduce swelling.  Reduce your risk of vascular disease  Stop smoking. If you would like help call [facility] at 1-800-QUIT-NOW (1-800-784-8669) or [place] at 336-586-4000.  Manage your cholesterol Maintain a desired weight Control your diabetes weight Control your diabetes Keep your blood pressure down  If you have any questions, please call the office at [phone]  

## 2019-12-22 ENCOUNTER — Inpatient Hospital Stay: Payer: Self-pay

## 2019-12-22 DIAGNOSIS — Z515 Encounter for palliative care: Secondary | ICD-10-CM

## 2019-12-22 DIAGNOSIS — I998 Other disorder of circulatory system: Secondary | ICD-10-CM

## 2019-12-22 LAB — BASIC METABOLIC PANEL
Anion gap: 15 (ref 5–15)
BUN: 16 mg/dL (ref 8–23)
CO2: 19 mmol/L — ABNORMAL LOW (ref 22–32)
Calcium: 7.2 mg/dL — ABNORMAL LOW (ref 8.9–10.3)
Chloride: 109 mmol/L (ref 98–111)
Creatinine, Ser: 2.04 mg/dL — ABNORMAL HIGH (ref 0.61–1.24)
GFR, Estimated: 32 mL/min — ABNORMAL LOW (ref >60)
Glucose, Bld: 99 mg/dL (ref 70–99)
Potassium: 3.2 mmol/L — ABNORMAL LOW (ref 3.5–5.1)
Sodium: 143 mmol/L (ref 135–145)

## 2019-12-22 LAB — CBC
HCT: 22 % — ABNORMAL LOW (ref 39.0–52.0)
Hemoglobin: 7.1 g/dL — ABNORMAL LOW (ref 13.0–17.0)
MCH: 28.4 pg (ref 26.0–34.0)
MCHC: 32.3 g/dL (ref 30.0–36.0)
MCV: 88 fL (ref 80.0–100.0)
Platelets: 222 10*3/uL (ref 150–400)
RBC: 2.5 MIL/uL — ABNORMAL LOW (ref 4.22–5.81)
RDW: 14 % (ref 11.5–15.5)
WBC: 14.4 10*3/uL — ABNORMAL HIGH (ref 4.0–10.5)
nRBC: 0 % (ref 0.0–0.2)

## 2019-12-22 LAB — GLUCOSE, CAPILLARY
Glucose-Capillary: 136 mg/dL — ABNORMAL HIGH (ref 70–99)
Glucose-Capillary: 138 mg/dL — ABNORMAL HIGH (ref 70–99)
Glucose-Capillary: 158 mg/dL — ABNORMAL HIGH (ref 70–99)
Glucose-Capillary: 173 mg/dL — ABNORMAL HIGH (ref 70–99)
Glucose-Capillary: 59 mg/dL — ABNORMAL LOW (ref 70–99)
Glucose-Capillary: 85 mg/dL (ref 70–99)
Glucose-Capillary: 90 mg/dL (ref 70–99)

## 2019-12-22 LAB — VITAMIN B12: Vitamin B-12: 271 pg/mL (ref 180–914)

## 2019-12-22 LAB — TSH: TSH: 6.151 u[IU]/mL — ABNORMAL HIGH (ref 0.350–4.500)

## 2019-12-22 LAB — PREPARE RBC (CROSSMATCH)

## 2019-12-22 MED ORDER — SODIUM CHLORIDE 0.9 % IV SOLN: INTRAVENOUS | Status: DC

## 2019-12-22 MED ORDER — SODIUM CHLORIDE 0.9% FLUSH
10.0000 mL | Freq: Two times a day (BID) | INTRAVENOUS | Status: DC
  Administered 2019-12-22 (×2): 10 mL

## 2019-12-22 MED ORDER — POTASSIUM CHLORIDE CRYS ER 20 MEQ PO TBCR
30.0000 meq | EXTENDED_RELEASE_TABLET | Freq: Once | ORAL | Status: AC
  Administered 2019-12-22: 05:00:00 30 meq via ORAL
  Filled 2019-12-22: qty 1

## 2019-12-22 MED ORDER — SODIUM CHLORIDE 0.9% IV SOLUTION: Freq: Once | INTRAVENOUS | Status: AC

## 2019-12-22 MED ORDER — HYDROMORPHONE HCL 1 MG/ML IJ SOLN
0.5000 mg | INTRAMUSCULAR | Status: DC | PRN
  Administered 2019-12-22 – 2019-12-23 (×3): 0.5 mg via INTRAVENOUS
  Filled 2019-12-22 (×3): qty 1

## 2019-12-22 MED ORDER — SODIUM CHLORIDE 0.9% FLUSH: 10.0000 mL | INTRAVENOUS | Status: DC | PRN

## 2019-12-22 MED ORDER — ACETAMINOPHEN 10 MG/ML IV SOLN
1000.0000 mg | Freq: Four times a day (QID) | INTRAVENOUS | Status: DC
  Administered 2019-12-22 – 2019-12-23 (×3): 1000 mg via INTRAVENOUS
  Filled 2019-12-22 (×4): qty 100

## 2019-12-22 NOTE — Progress Notes (Addendum)
[facility] Progress Note Patient Name: Thomas Snow DOB: 05/26/1934 MRN: [id number]   Date of Service  12/22/2019  HPI/Events of Note  Multiple issues: 1. Hypokalemia - K+ = 3.2 and Creatinine = 2.04. 2. Request for PICC line.   eICU Interventions  Plan: 1. Rreplace K+. 2. PICC line placement per PICC team.      Intervention Category Major Interventions: Electrolyte abnormality - evaluation and management  [name] 12/22/2019, 4:49 AM

## 2019-12-22 NOTE — Plan of Care (Signed)

## 2019-12-22 NOTE — Progress Notes (Signed)
Discussed with wife this morning we will place palliative care consult.  Given his advanced dementia this is a difficult situation.  He denies any pain in the foot this morning even though we cannot find any Doppler signals.  Presumably if he has no significant pain and the foot continues to look viable he could go home and follow-up as an outpatient and there would be no urgent need for above-knee amputation.  Again with his advanced dementia it is just hard to tell.  Marty Heck, MD [facility] Office: [place]

## 2019-12-22 NOTE — Progress Notes (Signed)
Peripherally Inserted Central Catheter Placement  The IV Nurse has discussed with the patient and/or persons authorized to consent for the patient, the purpose of this procedure and the potential benefits and risks involved with this procedure.  The benefits include less needle sticks, lab draws from the catheter, and the patient may be discharged home with the catheter. Risks include, but not limited to, infection, bleeding, blood clot (thrombus formation), and puncture of an artery; nerve damage and irregular heartbeat and possibility to perform a PICC exchange if needed/ordered by physician.  Alternatives to this procedure were also discussed.  Bard Power PICC patient education guide, fact sheet on infection prevention and patient information card has been provided to patient /or left at bedside.    PICC Placement Documentation  PICC Double Lumen 74/71/59 PICC Left Basilic 41 cm 0 cm (Active)  Indication for Insertion or Continuance of Line Poor Vasculature-patient has had multiple peripheral attempts or PIVs lasting less than 24 hours 12/22/19 1130  Exposed Catheter (cm) 0 cm 12/22/19 1130  Site Assessment Clean;Dry;Intact 12/22/19 1130  Lumen #1 Status Flushed;Blood return noted;Saline locked 12/22/19 1130  Lumen #2 Status Flushed;Blood return noted;Saline locked 12/22/19 1130  Dressing Type Transparent 12/22/19 1130  Dressing Status Clean;Dry 12/22/19 1130  Antimicrobial disc in place? Yes 12/22/19 1130  Dressing Change Due 12/29/19 12/22/19 1130    Wife gave consent via phone   Thomas Snow 12/22/2019, 11:38 AM

## 2019-12-22 NOTE — Progress Notes (Signed)
Patient report given to Katharine Look RN on [place]. Wife is at bedside. Reviewed with Katharine Look that patient is hollering out with every touch and patient interaction. This is forcing blood pressure to have a wide range of recording. Tried to trouble shoot by changing size of blood pressure cuffs and placement with no improvement. Blood pressure was inaccurate prior to blood administration. PRFO boot placed. Transfer to [address] with blood infusing with no distress noted. Remains on room air with no transfusion reactions noted at this time. Will transfer care at this time.

## 2019-12-22 NOTE — Consult Note (Signed)
Palliative Care Consultation Note Reason: Symptom Management, Goals of Care Requested by: Dr. Carlis Abbott, VVS   Mr. Pendry is a an 85 year old gentleman who has advanced vascular dementia and also has advanced peripheral vascular disease.  He presented to the emergency department on 12/20 with a complaint of altered mental status and his wife reported that he was yelling out in pain.  At the time of examination in the ED,  he was found to have critical left lower limb ischemia and was at high risk for limb loss.  He was urgently taken to the operating room and an attempt was made at revascularization however his left lower extremity remained cooled and without dopplerable pulses.  A palliative care consultation was requested to determine the patient's goals of care and to consider options and choices as he may face the need for extremity amputation.  He is also having difficulty with postoperative delirium in the setting of vascular dementia.  He has postoperative and ischemic limb pain.  His wife is his primary caregiver and has little support.   I had a discussion at bedside with the patient's wife to determine her understanding of her husband's condition and to determine some goals of care given his current serious illness and risk for amputation.  The wife tells me that for several years he has suffered from vascular dementia, he requires 24/7 supervision but was ambulatory prior to this admission.  She has found it increasingly difficult to care for him at home as she has her own diagnosis of metastatic breast cancer and has been experiencing caregiver fatigue and burnout.   I introduced the concept of both palliative care and hospice care given his current prognosis.  The the patient's wife was not has not had previous experiences with either but was open to the assistance of our team in making decisions regarding her husband's care.  She does not believe that she can care for him in his current or an  even worse debilitated state at home.  I did discuss a general overview of his many chronic medical problems as well as his advanced illness including advanced peripheral vascular disease chronic kidney disease chronic coronary artery disease, and dementia.  She recognizes that her husband may be approaching end-of-life.  Mr. Guerrette has a living will, his wife is going to bring that to the hospital so that we can make a copy and include in his chart.  We discussed the concept of CODE STATUS and in the event of a cardiac arrest this patient would not desire an attempt at resuscitation given his current condition and with the information that any such attempt would likely not result in beneficial outcomes and would cayuse additional pain and suffering. He has largely irreversible disease, and his functional status would not be salvageable in his current state, and would be worse following an amputation. The patient would not desire nursing home level care for the duration of his life and with his subacute delirium and baseline progressive vascular dementia would not be a good candidate for rehabilitation.  On exam today Mr. Sonnenberg appears frail, he is confused and very agitated.  He has moments where he is resting and then will yell out in pain.  I provided reassurance and comfort to Mr. Guiney and he expressed gratitude for this. He also endorsed pain and asked for help with this-at one point during my visit he had an outpouring of emotion and tears from the pain, and begged me to help him  with this.  I explained to his wife that ischemic limb pain can be extremely severe, but the pain is treatable and she gave me permission to treat his pain regardless of the impact of that treatment on things like his vital signs or awareness level.  She was clear that she did not want him to suffer, nor would he choose to have his life prolonged in a place of suffering or inability to walk or care for himself.  Given the  very high likelihood that Mr. Effinger will be offered limb amputation I introduced that concept of hospice care and comfort care as an alternative to any additional medical procedures or interventions that may in effort to help prolong his life and treat his condition have an outcome that will leave him in a long term care nursing facility and with an overall poor QOL.  His wife agrees that if the patient could make the decision for himself he would not choose to have amputation or have his life prolonged in his current state and with his current QOL. He would choose a natural death with comfort and dignity.  Additional Poor Prognostic Indicators: Frailty, Poor PO intake, Delirium, CKD, Anemia, low albumin 3.0, recent falls at home  His wife would like to discuss his condition and the information I have provided with his children who are "not close" but need to know his condition. I offered to speak with them as well.   Recommendations:  1. DNR, code status updated in orders. 2. Consider Hospice Care and [place] as an alternative to limb amputation based on patient's previously stated wishes and goals. His wife would need physicians involved with his care to provide recommendations and guidance for this- will defer to Dr. Carlis Abbott and his team on options to treat him vs. comfort care transition. 3. Provide Caregiver support to his wife -she has metastatic breast cancer and has signs of caregiver distress-will ask Spiritual Care and our chaplain to see her. 4. Pain Control: Scheduled IV Tylenol, Hydromorphone 0.5mg  q3 hours prn for pain, I discussed pain control with RN.  Will follow up tomorrow-I encouraged his wife to reach out to his children and update them on his condition. Will await vascular surgery plan and further management recommendations.  Lane Hacker, DO Palliative Medicine

## 2019-12-22 NOTE — Progress Notes (Signed)
Inpatient Diabetes Program Recommendations  AACE/ADA: New Consensus Statement on Inpatient Glycemic Control (2015)  Target Ranges:  Prepandial:   less than 140 mg/dL      Peak postprandial:   less than 180 mg/dL (1-2 hours)      Critically ill patients:  140 - 180 mg/dL   Lab Results  Component Value Date   GLUCAP 85 12/22/2019   HGBA1C 6.8 (H) 12/21/2019    Review of Glycemic Control Results for Thomas Snow, Thomas Snow (MRN [id number]) as of 12/22/2019 11:51  Ref. Range 12/21/2019 15:30 12/21/2019 20:38 12/22/2019 00:07 12/22/2019 00:48 12/22/2019 04:01 12/22/2019 08:02 12/22/2019 11:39  Glucose-Capillary Latest Ref Range: 70 - 99 mg/dL 114 (H) 158 (H) 59 (L) 90 136 (H) 138 (H) 85    Current orders for Inpatient glycemic control:  Novolog 2-6 units Q4H  Inpatient Diabetes Program Recommendations:     Novolog 0-6 units TID with meals as he is eating.  This may help avoid insulin stacking and hypoglycemia.    Will continue to follow while inpatient.  Thank you, Reche Dixon, RN, BSN Diabetes Coordinator Inpatient Diabetes Program [phone] (team pager from 8a-5p)

## 2019-12-22 NOTE — Progress Notes (Signed)
Wife has all clothes and teeth with the patient to [place] room 20

## 2019-12-22 NOTE — Progress Notes (Addendum)
Progress Note    12/22/2019 7:16 AM 2 Days Post-Op  Subjective:  Pleasantly confused.   Tm 99 Hr 90's-110's NSR 27'P-824'M systolic 353% RA  Vitals:   12/22/19 0330 12/22/19 0400  BP:  (!) 126/51  Pulse: (!) 106 95  Resp: 19 10  Temp:  99 F (37.2 C)  SpO2: 100% 100%    Physical Exam: Cardiac:  regular Lungs:  Non labored Incisions:  Left groin and left below knee incisions look good Extremities:  Unable to take sock off as pt did not tolerate.  Unable to obtain doppler signals   CBC    Component Value Date/Time   WBC 14.4 (H) 12/22/2019 0132   RBC 2.50 (L) 12/22/2019 0132   HGB 7.1 (L) 12/22/2019 0132   HCT 22.0 (L) 12/22/2019 0132   PLT 222 12/22/2019 0132   MCV 88.0 12/22/2019 0132   MCH 28.4 12/22/2019 0132   MCHC 32.3 12/22/2019 0132   RDW 14.0 12/22/2019 0132   LYMPHSABS 1.0 12/20/2019 1226   MONOABS 0.6 12/20/2019 1226   EOSABS 0.1 12/20/2019 1226   BASOSABS 0.1 12/20/2019 1226    BMET    Component Value Date/Time   NA 143 12/22/2019 0132   K 3.2 (L) 12/22/2019 0132   CL 109 12/22/2019 0132   CO2 19 (L) 12/22/2019 0132   GLUCOSE 99 12/22/2019 0132   BUN 16 12/22/2019 0132   CREATININE 2.04 (H) 12/22/2019 0132   CREATININE 1.60 (H) 05/30/2016 1122   CALCIUM 7.2 (L) 12/22/2019 0132   GFRNONAA 32 (L) 12/22/2019 0132   GFRNONAA 40 (L) 05/30/2016 1122   GFRAA 31 (L) 07/04/2018 1120   GFRAA 46 (L) 05/30/2016 1122    INR    Component Value Date/Time   INR 1.0 12/20/2019 1226     Intake/Output Summary (Last 24 hours) at 12/22/2019 0716 Last data filed at 12/22/2019 0400 Gross per 24 hour  Intake 2000.27 ml  Output 475 ml  Net 1525.27 ml     Assessment:  85 y.o. male is s/p:  1. Left lower extremity arteriogram 2. Left below-knee popliteal artery,TP trunk, [place] posterior tibial artery thrombectomy 3. Left common femoral to below-knee popliteal arterybypass with 6 mm ringed PTFEgraft 4. Left below-knee popliteal  artery and tibioperoneal trunk endarterectomy with bovine pericardial patch angioplasty   2 Days Post-Op  Plan: -unable to obtain doppler signals left foot.  Continues to have left femoral doppler signal.   -unable to examine pt's foot as he would not tolerate me taking his sock off.  Pt is at high risk for amputation -creatinine elevated at 2.04 but stable from yesterday 2.02.   -acute blood loss anemia-hgb down to 7.1 from 8.7 yesterday. No evidence of bleeding at either incision.  Pt tolerating but tachycardic this am.  May need fluid bolus vs one unit PRBC's-will d/w Dr. Carlis Abbott.  Addendum:  Discussed with Dr. Carlis Abbott and will give one unit PRBC's today. -leukocytosis stable at 14.4k-low grade fever 99 this am -will check labs in am.  .   -DVT prophylaxis:  Sq heparin   [name], PA-C [facility] [phone] 12/22/2019 7:16 AM  I have seen and evaluated the patient. I agree with the PA note as documented above.  85 year old male now postop day 2 status post complex revascularization of left leg after presenting with acute on chronic limb ischemia to the ED.  He does not have any identifiable signals in the left foot which he did not have any signals  yesterday either.  Previously discussed with him and his wife would need above-knee amputation as neck step.  His foot actually does not appear ischemic and he does have profunda runoff.  He denies any pain in the foot.  Overall this is a very difficult situation given very advanced dementia and a very difficult physical exam.  We will give him 1 unit of blood today for hemoglobin of 7.1 and ongoing tachycardia and then hopefully move him to the floor.  He does have AKI following OR intervention and we will keep him on gentle hydration today and overall creatinine is stable 2.02 to 2.04 today.  Marty Heck, MD [facility] Office: [phone]

## 2019-12-22 NOTE — Progress Notes (Signed)
Physical Therapy Treatment Patient Details Name: Thomas Snow MRN: [id number] DOB: 1934-06-20 Today's Date: 12/22/2019    History of Present Illness 85 yo male presenting to ED with AMS and found to have acute limb ischemia LLE. S/p LLE arteriogram, thrombectomy, popliteal bypass, popliteal/tibioperoneal trunk endarterectomy with bovine pericardial patch angioplasty. CT head neg for acute change. PMH including dementia, PAD, CAD, and AF/SS s/p PPM    PT Comments    Pt supine in bed on arrival.  He is pleasantly confused.  Pt denies pain but noted to yell in pain with any movement.  Plan for SNF remains appropriate as he continues to require max assistance at times due to pain.  Pt with significant guarding and tightness in L heel cord.  Order placed for L prafo to improve ROM.     Follow Up Recommendations  SNF;Supervision/Assistance - 24 hour     Equipment Recommendations  Rolling walker with 5" wheels;3in1 (PT)    Recommendations for Other Services       Precautions / Restrictions Precautions Precautions: Fall;Other (comment) (vitals.) Required Braces or Orthoses:  (order placed for L PRAFO post tx to improve ROM in L ankle.  Brace to be delivered by [facility] [facility].) Restrictions Weight Bearing Restrictions: No    Mobility  Bed Mobility Overal bed mobility: Needs Assistance Bed Mobility: Supine to Sit;Sit to Supine     Supine to sit: Max assist Sit to supine: Min assist   General bed mobility comments: Max assistance to move B LEs to edge of bed and elevate trunk into a seated position.  Pt presents with increased pain in LLE with mobility.  Pt able to move back to bed with min assistance for LLE only.  Increased time to scoot to [facility] due to cognitive deficits.  Transfers Overall transfer level: Needs assistance Equipment used: Rolling walker (2 wheeled) Transfers: Sit to/from [employer];Lateral/Scoot Transfers Sit to Stand: Max assist (attempted  sit to stand x2 with RW and presents withg flexed hip and trunk and unable to bear weight on LLE due to pain, decreased ROM and tight heel cords.)   Squat pivot transfers: Max assist (for back to bed from low chair to higher bed.  Pt attempting to help but minimal efforts noted on his behalf.)    Lateral/Scoot Transfers: Min assist (From high to low surface able to follow commands to perform lateral scoot.) General transfer comment: Performed various techniques.  Ambulation/Gait Ambulation/Gait assistance:  (NT- unable)               Stairs             Wheelchair Mobility    Modified Rankin (Stroke Patients Only)       Balance Overall balance assessment: Needs assistance Sitting-balance support: No upper extremity supported;Feet supported Sitting balance-Leahy Scale: Poor       Standing balance-Leahy Scale: Poor                              Cognition Arousal/Alertness: Awake/alert Behavior During Therapy: WFL for tasks assessed/performed (pleasantly confused) Overall Cognitive Status: History of cognitive impairments - at baseline                                 General Comments: Baseline dementia. Following simple commands. Able to distract away from pain.      Exercises General Exercises - Lower  Extremity Ankle Circles/Pumps: AROM;Both;AAROM;10 reps;Supine Heel Raises: AROM;AAROM;Both;10 reps;Supine;Limitations Heel Raises Limitations: AAROM x on LLE with very gentle ROM and significant limitation in ROM.    General Comments        Pertinent Vitals/Pain Pain Assessment: Faces Faces Pain Scale: Hurts worst Pain Location: LLE with movement. Pain Descriptors / Indicators: Guarding;Grimacing;Discomfort;Crying Pain Intervention(s): Monitored during session    Home Living                      Prior Function            PT Goals (current goals can now be found in the care plan section) Acute Rehab PT  Goals Patient Stated Goal: Wife would like help at home and is agreeable to SNF if pt unable to perform mobility Potential to Achieve Goals: Good Progress towards PT goals: Progressing toward goals    Frequency    Min 3X/week      PT Plan Current plan remains appropriate    Co-evaluation              AM-PAC PT "6 Clicks" Mobility   Outcome Measure  Help needed turning from your back to your side while in a flat bed without using bedrails?: A Lot Help needed moving from lying on your back to sitting on the side of a flat bed without using bedrails?: A Lot Help needed moving to and from a bed to a chair (including a wheelchair)?: A Lot Help needed standing up from a chair using your arms (e.g., wheelchair or bedside chair)?: A Lot Help needed to walk in hospital room?: Total Help needed climbing 3-5 steps with a railing? : Total 6 Click Score: 10    End of Session Equipment Utilized During Treatment: Gait belt Activity Tolerance: Patient limited by fatigue Patient left: in bed;with call bell/phone within reach;with family/visitor present;with bed alarm set Nurse Communication: Mobility status PT Visit Diagnosis: Muscle weakness (generalized) (M62.81);Pain Pain - Right/Left: Left Pain - part of body: Leg     Time: 1000-1028 PT Time Calculation (min) (ACUTE ONLY): 28 min  Charges:  $Therapeutic Exercise: 8-22 mins $Therapeutic Activity: 8-22 mins                     Erasmo Leventhal , PTA Acute Rehabilitation Services Pager [phone] Office [phone]     Jolyne Laye Eli Hose 12/22/2019, 11:08 AM

## 2019-12-22 NOTE — Progress Notes (Signed)
This chaplain responded to PMT referral for spiritual care.  The Pt.'s wife-Frances is bedside. [name] shares the Pt. appears to be more comfortable than earlier today.   [name] accepted the chaplain's offer to sit beside her and listen. The chaplain understands [name] has more clarity about the Pt. illness after the conversation with Dr. Hilma Favors.  With the new understanding [name] is deciding on the best way to communicate with the Pt. children.  The chaplain offered PMT support as needed in the discussion.   [name] accepted a return visit from the chaplain and the gentle reminders of self care.

## 2019-12-22 NOTE — Progress Notes (Signed)
NAME:  Thomas Snow, MRN:  [id number], DOB:  March 07, 1934, LOS: 2 ADMISSION DATE:  12/20/2019, CONSULTATION DATE:  12/21/19 REFERRING MD:  Carlis Abbott, CHIEF COMPLAINT:  Shock  Brief History   85yM with extensive PAD s/p attempted bypass with labile BP perioperatively, postoperatively  History of present illness   85yM with extensive PAD s/p attempted bypass with labile BP perioperatively, postoperatively. He was in his usual state of health (can feed himself but otherwise limited in ADLs and can answer simple yes/no at baseline, only oriented to self and not oriented to place or situation) until around 11am day of admission he looked suddenly confused according to his wife and he howled in pain from his LLE. She says it took a great deal of effort to get him dressed afterward and moving around. In the ED he was found to have acute limb ischemia LLE and he was taken for LLE arteriogram, thrombectomy, popliteal bypass, popliteal/tibioperoneal trunk endarterectomy with bovine pericardial patch angioplasty. Surgery aware distal LLE remains cool and without dopplerable dp/pt pulses.  2L given intraoperatively and 1U pRBC. 1L charted EBL.   Past Medical History  Dementia PAD AF/SSS s/p PPM CAD  Significant Hospital Events   12/21/19 LLE arteriogram, thrombectomy, popliteal bypass, popliteal/tibioperoneal trunk endarterectomy with bovine pericardial patch angioplasty  Consults:  Vascular surgery PCCM  Procedures:  12/21/19 LLE arteriogram, thrombectomy, popliteal bypass, popliteal/tibioperoneal trunk endarterectomy with bovine pericardial patch angioplasty  Significant Diagnostic Tests:  Arteriogram as above Echo 12/21/19: LVEF 98% grade I diastolic dysfunction, RVSP 45  Micro Data:  None  Antimicrobials:  Ancef perioperatively  Interim history/subjective:  12/20: consulted this am. Renal function declining. Cont to monitor bp and mental status.   Objective   Blood pressure (!)  121/58, pulse 91, temperature 99 F (37.2 C), temperature source Axillary, resp. rate 16, height 5\' 6"  (1.676 m), weight 42.8 kg, SpO2 100 %.        Intake/Output Summary (Last 24 hours) at 12/22/2019 9211 Last data filed at 12/22/2019 0700 Gross per 24 hour  Intake 2300.26 ml  Output 625 ml  Net 1675.26 ml   Filed Weights   12/21/19 0245  Weight: 42.8 kg    Examination: General: alert/oriented to person only, resting comfortably in bed HENT: NCAT, dry MM eomi, perrla Lungs: CTAB, normal work of breathing Cardiovascular: RRR, systolic murmur no JVD  Abdomen: soft, nontender, normal bowel sounds Extremities: LLE is cooler than right but per bedside RN, has warmed slightly since procedure. No dopplerable pulse LLE at dp or pt. There is dopplerable R pt. Radial palpable bilaterally. Incisions look clean and there is no surrounding hematoma at either site.  Neuro: does not answer questions appropriately, grossly nonfocal, follows commands  Resolved Hospital Problem list   n/a  Assessment & Plan:   # Labile blood pressure: may be result of hypovolemia and difficulty achieving reliable cuff pressures with extent of upper and lower extremity large vessel vasculopathy. In clinic only able to get R arm BP which at baseline is normal however here LUE seems a bit more reliable with mildly HTNsive BPs. -off pressors - trend cbc, decreased hgb overnight vascular giving 1U PRBC -no overt bleeding seen -echo LVEF 94%, grade I diastolic dysfunction, RVSP 45.5 - if persistently low BP then may need to entertain arterial line, obtain BCx, UA and empiric ABX   # Acute limb ischemia LLE s/p bypass: - pulse checks per vascular, anticipate that he may require amputation for definitive intervention during hospitalization depending  on goals of care - pulse checks per vascular surgery  # Acute encephalopathy: with baseline as documented above. -  tsh, b12 never sent will reorder   #t2dm with  hyperglycemia -ssi  #arf:  #metabolic acidosis:  -Bicarb improving -monitor uop ~827ml/24hr -indices up but relatively stable #hypokalemia:  -replace Best practice:  Diet: NPO Pain/Anxiety/Delirium protocol (if indicated): no VAP protocol (if indicated): no DVT prophylaxis: per vascular surgery GI prophylaxis: not indicated Glucose control: ssi Mobility: bed level Code Status: Full Family Communication: per primary Disposition: per primary. CCM will sign off at this time as he is stable for transfer to the floor. If further medical management req recommend consult TRH to follow. Of course if there is any change please call with any questions.   Labs   CBC: Recent Labs  Lab 12/20/19 1226 12/20/19 1227 12/20/19 2242 12/21/19 0020 12/21/19 0802 12/22/19 0132  WBC 13.3*  --   --   --  14.7* 14.4*  NEUTROABS 11.5*  --   --   --   --   --   HGB 13.4 14.3 9.5* 9.9* 8.7* 7.1*  HCT 42.6 42.0 28.0* 29.0* 26.5* 22.0*  MCV 87.7  --   --   --  86.9 88.0  PLT 349  --   --   --  247 222    Basic Metabolic Panel: Recent Labs  Lab 12/20/19 1226 12/20/19 1227 12/20/19 2242 12/21/19 0020 12/21/19 0802 12/22/19 0132  NA 140 138 140 140 137 143  K 4.5 4.3 3.7 4.0 3.8 3.2*  CL 101 101 104 104 106 109  CO2 25  --   --   --  15* 19*  GLUCOSE 232* 223* 156* 174* 262* 99  BUN 17 20 14 14 16 16   CREATININE 1.98* 1.80* 1.50* 1.40* 2.02* 2.04*  CALCIUM 8.9  --   --   --  7.6* 7.2*   GFR: Estimated Creatinine Clearance: 16.6 mL/min (A) (by C-G formula based on SCr of 2.04 mg/dL (H)). Recent Labs  Lab 12/20/19 1226 12/20/19 1729 12/21/19 0455 12/21/19 0802 12/22/19 0132  WBC 13.3*  --   --  14.7* 14.4*  LATICACIDVEN  --  2.6* 1.7  --   --     Liver Function Tests: Recent Labs  Lab 12/20/19 1226  AST 19  ALT 15  ALKPHOS 79  BILITOT 0.3  PROT 6.7  ALBUMIN 3.3*   No results for input(s): LIPASE, AMYLASE in the last 168 hours. No results for input(s): AMMONIA in the  last 168 hours.  ABG    Component Value Date/Time   TCO2 22 12/21/2019 0020     Coagulation Profile: Recent Labs  Lab 12/20/19 1226  INR 1.0    Cardiac Enzymes: Recent Labs  Lab 12/20/19 1729 12/21/19 0455  CKTOTAL 45* 256    HbA1C: Hgb A1c MFr Bld  Date/Time Value Ref Range Status  12/21/2019 08:02 AM 6.8 (H) 4.8 - 5.6 % Final    Comment:    (NOTE) Pre diabetes:          5.7%-6.4%  Diabetes:              >6.4%  Glycemic control for   <7.0% adults with diabetes   02/06/2014 06:32 PM 7.0 (H) 4.8 - 5.6 % Final    Comment:    (NOTE)         Pre-diabetes: 5.7 - 6.4         Diabetes: >6.4  Glycemic control for adults with diabetes: <7.0     CBG: Recent Labs  Lab 12/21/19 2038 12/22/19 0007 12/22/19 0048 12/22/19 0401 12/22/19 0802  GLUCAP 158* 59* 90 136* 138*      care time: The patient is critically ill with multiple organ systems failure and requires high complexity decision making for assessment and support, frequent evaluation and titration of therapies, application of advanced monitoring technologies and extensive interpretation of multiple databases.   care time 36 mins. This represents my time independent of the NPs time taking care of the pt. This is excluding procedures.    [facility] 12/22/2019, 8:23 AM

## 2019-12-23 ENCOUNTER — Encounter (HOSPITAL_COMMUNITY): Payer: Self-pay | Admitting: Vascular Surgery

## 2019-12-23 DIAGNOSIS — I998 Other disorder of circulatory system: Secondary | ICD-10-CM

## 2019-12-23 LAB — BASIC METABOLIC PANEL
Anion gap: 7 (ref 5–15)
BUN: 12 mg/dL (ref 8–23)
CO2: 25 mmol/L (ref 22–32)
Calcium: 6.9 mg/dL — ABNORMAL LOW (ref 8.9–10.3)
Chloride: 111 mmol/L (ref 98–111)
Creatinine, Ser: 1.72 mg/dL — ABNORMAL HIGH (ref 0.61–1.24)
GFR, Estimated: 39 mL/min — ABNORMAL LOW (ref >60)
Glucose, Bld: 101 mg/dL — ABNORMAL HIGH (ref 70–99)
Potassium: 3.2 mmol/L — ABNORMAL LOW (ref 3.5–5.1)
Sodium: 143 mmol/L (ref 135–145)

## 2019-12-23 LAB — BPAM RBC
Blood Product Expiration Date: 202201122359
Blood Product Expiration Date: 202201132359
ISSUE DATE / TIME: 202112192248
ISSUE DATE / TIME: 202112211311
Unit Type and Rh: 7300
Unit Type and Rh: 7300

## 2019-12-23 LAB — TYPE AND SCREEN
ABO/RH(D): B POS
Antibody Screen: NEGATIVE
Unit division: 0
Unit division: 0

## 2019-12-23 LAB — GLUCOSE, CAPILLARY
Glucose-Capillary: 102 mg/dL — ABNORMAL HIGH (ref 70–99)
Glucose-Capillary: 129 mg/dL — ABNORMAL HIGH (ref 70–99)
Glucose-Capillary: 136 mg/dL — ABNORMAL HIGH (ref 70–99)
Glucose-Capillary: 161 mg/dL — ABNORMAL HIGH (ref 70–99)
Glucose-Capillary: 202 mg/dL — ABNORMAL HIGH (ref 70–99)

## 2019-12-23 LAB — CBC
HCT: 24.8 % — ABNORMAL LOW (ref 39.0–52.0)
Hemoglobin: 8.5 g/dL — ABNORMAL LOW (ref 13.0–17.0)
MCH: 29.7 pg (ref 26.0–34.0)
MCHC: 34.3 g/dL (ref 30.0–36.0)
MCV: 86.7 fL (ref 80.0–100.0)
Platelets: 200 10*3/uL (ref 150–400)
RBC: 2.86 MIL/uL — ABNORMAL LOW (ref 4.22–5.81)
RDW: 14.5 % (ref 11.5–15.5)
WBC: 10.6 10*3/uL — ABNORMAL HIGH (ref 4.0–10.5)
nRBC: 0 % (ref 0.0–0.2)

## 2019-12-23 MED ORDER — HYDROMORPHONE HCL 1 MG/ML IJ SOLN
1.0000 mg | INTRAMUSCULAR | Status: DC | PRN
Start: 2019-12-23 — End: 2019-12-23
  Administered 2019-12-23: 1 mg via INTRAVENOUS
  Filled 2019-12-23: qty 1

## 2019-12-23 NOTE — Progress Notes (Addendum)
Progress Note    12/23/2019 9:10 AM 3 Days Post-Op  Subjective:  Appears comfortable sleeping. He did wake for a few minutes while talking with his wife and he remained pleasantly confused and in no distress   Vitals:   12/22/19 2323 12/23/19 0306  BP: 104/62 135/71  Pulse: (!) 105   Resp: 20 15  Temp: 97.9 F (36.6 C) 98 F (36.7 C)  SpO2: 100% 100%   Physical Exam: Cardiac: regular Lungs: non labored Incisions: left lower extremity incisions intact Extremities: Left leg with prafo boot. I did not fully examine due to trying to avoid agitating the patient  CBC    Component Value Date/Time   WBC 10.6 (H) 12/23/2019 0305   RBC 2.86 (L) 12/23/2019 0305   HGB 8.5 (L) 12/23/2019 0305   HCT 24.8 (L) 12/23/2019 0305   PLT 200 12/23/2019 0305   MCV 86.7 12/23/2019 0305   MCH 29.7 12/23/2019 0305   MCHC 34.3 12/23/2019 0305   RDW 14.5 12/23/2019 0305   LYMPHSABS 1.0 12/20/2019 1226   MONOABS 0.6 12/20/2019 1226   EOSABS 0.1 12/20/2019 1226   BASOSABS 0.1 12/20/2019 1226    BMET    Component Value Date/Time   NA 143 12/23/2019 0305   K 3.2 (L) 12/23/2019 0305   CL 111 12/23/2019 0305   CO2 25 12/23/2019 0305   GLUCOSE 101 (H) 12/23/2019 0305   BUN 12 12/23/2019 0305   CREATININE 1.72 (H) 12/23/2019 0305   CREATININE 1.60 (H) 05/30/2016 1122   CALCIUM 6.9 (L) 12/23/2019 0305   GFRNONAA 39 (L) 12/23/2019 0305   GFRNONAA 40 (L) 05/30/2016 1122   GFRAA 31 (L) 07/04/2018 1120   GFRAA 46 (L) 05/30/2016 1122    INR    Component Value Date/Time   INR 1.0 12/20/2019 1226     Intake/Output Summary (Last 24 hours) at 12/23/2019 0910 Last data filed at 12/22/2019 1530 Gross per 24 hour  Intake 345 ml  Output 1 ml  Net 344 ml     Assessment/Plan:  85 y.o. male is s/p  1. Left lower extremity arteriogram 2. Left below-knee popliteal artery,TP trunk, [place] posterior tibial artery thrombectomy 3. Left common femoral to below-knee popliteal  arterybypass with 6 mm ringed PTFEgraft 4. Left below-knee popliteal artery and tibioperoneal trunk endarterectomy with bovine pericardial patch angioplasty  3 Days Post-Op. Appears comfortable this morning. Not expressing any pain.  Hemoglobin responded appropriately post transfusion now 8.6. Hemodynamically stable. Leukocytosis trending down. Scr improved today 1.72 from 2. Patients pain seems to be manageable at this time although hard to fully assess given dementia. He had a restful night per his wife. Certainly patient remains at high risk for limb loss. Appreciate palliate care assistance. Family is having a meeting today at 54. Presently no need for urgent amputation but family to discuss this option vs comfort care   Karoline Caldwell, [place] [facility] [phone] 12/23/2019 9:10 AM   I have seen and evaluated the patient. I agree with the PA note as documented above.  85 year old male presented with acute on chronic limb ischemia on Sunday night in the setting of advanced dementia.  As previously noted he underwent a complex attempt at revascularization.  He still has no Doppler signals in the foot and it is a bit cooler today.  He has no further options for revascularization as I discussed with his wife and my only other offer would be an above-knee amputation if his pain is uncontrolled or  he continues to develop worsening tissue loss.  The foot actually looks marginal.  It is difficult exam given his advanced dementia.  Every time we touch his foot he screams.  Appreciate palliative care seeing him and there is a family meeting today.  Discussed with the wife that if he required above-knee amputation do not anticipate he would be able to live at home and would likely be in a SNF.  Marty Heck, MD [facility] [place] Office: [phone]

## 2019-12-23 NOTE — Progress Notes (Signed)
Spoke with patient's daughter, Glenis Smoker about his condition-she is an OR Marine scientist. She understands the severity of his condition and possible trajectory. I plan to meet her today around 11-1128 with patient's wife-they will likely be interested in hospice options.  Lane Hacker, DO Palliative Medicine

## 2019-12-23 NOTE — TOC Transition Note (Addendum)
Transition of Care (TOC) - CM/SW Discharge Note Marvetta Gibbons RN, BSN Transitions of Care Unit 4E- RN Case Manager See Treatment Team for direct phone #    Patient Details  Name: Thomas Snow MRN: [id number] Date of Birth: [birth year]  Transition of Care [facility]) CM/SW Contact:  Dawayne Patricia, RN Phone Number: 12/23/2019, 2:58 PM   Clinical Narrative:    Received notice from [facility] team for Residential hospice - family would like [facility] per choice- Referral called to Cheri with [facility]- per [facility] they do have bed available today once pt has been reviewed and approved by Hospice MD. [facility] will come meet family here for paperwork, have spoken with wife and family present to let them know [facility] would be coming to speak with them. Once everything completed and pt accepted into Hospice - Cheri will notify [facility] team that pt is ready for transport this afternoon.   1445- received call from [place]- paperwork has been completed and pt ready for transport to [facility]- ([address] [facility] [place])  Phone # for RN to call report is [phone]  Call made to [facility] to schedule pickup for transport as soon as possible- pt is ready. Per Cheri- plan is to leave IV in place. Paperwork along with GOLD DNR placed at nursing desk for [facility]   Final next level of care: [place] Barriers to Discharge: No Barriers Identified   Patient Goals and CMS Choice Patient states their goals for this hospitalization and ongoing recovery are:: hospice/comfort care CMS Medicare.gov Compare Post Acute Care list provided to:: Patient Represenative (must comment) Choice offered to / list presented to : Spouse  Discharge Placement               Hospice Home        Discharge Plan and Services In-house Referral: Clinical Social Work,Hospice / Palliative Care Discharge Planning Services: CM Consult Post Acute Care Choice: Hospice          DME Arranged: N/A DME  Agency: NA         HH Agency: [place] Date [place]: 12/23/19 Time [place]: [place] Representative spoke with at [place]: [place] ([place]) Interventions     Readmission Risk Interventions Readmission Risk Prevention Plan 12/23/2019  Transportation Screening Complete  PCP or Specialist Appt within 5-7 Days Complete  Home Care Screening Complete  Medication Review (RN CM) Complete  Some recent data might be hidden

## 2019-12-23 NOTE — Progress Notes (Signed)
This chaplain is present bedside for F/U spiritual care with the Pt. wife-Frances.  The Pt. is resting comfortably during the visit.  The chaplain listens as [name] reflects on her communication with the Pt. family and the plans for a family meeting at [address] with Dr. Hilma Favors.   The Pt. daughter-Teresa and her husband are bedside and communicating with the Pt. sons.  The chaplain stepped away from the room to allow for continued family communication and time together.  F/U spiritual care will continue as needed.

## 2019-12-23 NOTE — Progress Notes (Signed)
Pt discharged today to [facility] via [place].  Pt left with all of their personal belongings.  Pt taken off telemetry and CCMD notified.  PT's PICC line left in place per MD order.  AVS documentation reviewed and given to [facility].  Report given to Carin Hock RN at [facility] facility.

## 2019-12-23 NOTE — Discharge Summary (Signed)
Bypass Discharge Summary Patient ID: Thomas Snow [id number] y.o. August 30, 1934  Admit date: 12/20/2019  Discharge date: 12/23/2019  Admitting Physician: Marty Heck, MD   Discharge Physician: Marty Heck, MD  Admission Diagnoses: Ischemic foot [I99.8] Status post surgery [Z98.890] Upper respiratory tract infection, unspecified type [J06.9] Dementia with behavioral disturbance, unspecified dementia type [facility]) [F03.91]  Discharge Diagnoses: Ischemic left foot Peripheral arterial disease Status post left lower extremity bypass Dementia  Admission Condition: stable  Discharged Condition: stable  Indication for Admission: Patient presented with altered mental status and noted to have an acutely ischemic left lower extremity  Hospital Course: 85 y.o. male with multiple medical issues including advanced dementia that presents with ischemic left leg in the setting of previous left SFA atherectomy and Viabahn covered stenting in 2018 by Dr. Gwenlyn Found.  Patient was being worked up in the ED for brief period of altered mental status and noted to have ischemic left leg later this evening.  He was found to have no Doppler signals in the left foot and the foot is indeed pale.  He did have an easily palpable left femoral pulse. It was suspected that his left SFA stents are occluded.  Discussed with him and his wife options of left leg thrombectomy and likely angiogram and then possible bypass if I cannot get his stents open.  We also discussed no surgery and likely limb loss.  Discussed with his wife will be at risk for limb loss even with intervention.  She takes care of him and states he is ambulatory and she would like to save his leg.  We will proceed to the operating room.  He subsequently was taken to the operating room on 12/20 and underwent left lower extremity arteriogram, left below knee popliteal artery, tibioperoneal trunk and posterior tibial artery thrombectomy; left  common femoral to below knee popliteal artery bypass with 6 mm PTFE graft and left below knee popliteal artery and tibioperoneal trunk endarterectomy with bovine pericardial patch angioplasty. Patient tolerated the procedure well and was taken to the PACU in stable condition.   Patient was later noted to have loss of doppler signals in the PACU  POD#1 on exam patient having a lot of discomfort in LLE but hard to assess due to dementia. Incisions appeared intact. No distal signals on doppler. Brisk signal in bypass graft and palpable femoral pulse. On angiogram he essentially had no outflow beyond graft. No further options for revascularization. Discussion was had with patients wife regarding options. Patient high risk for more proximal amputation but continued to monitor and follow labs. Critical care management assisting with medical management of patient  POD#2 patients status remains stable. Pleasantly confused but expresses extreme pain when lower extremity is examined. Incisions remain intact. Prafo shoe ordered. Palliative care consult ordered to discuss goals of care with patients family. No need for acute amputation with foot still appearing viable. Hard to determine significance of pain with patients dementia. Palliative care recommendations were DNR, Hospice and comfort care, provider caregiver support and pain control. Plan to meet with patients children tomorrow  POD#3 patient remains stable. Appears more comfortable but pleasantly confused. Still expresses pain with manipulation of left lower extremity. Left leg incisions intact. Left foot still appears viable. Meeting was arranged with Patients wife and patients children. Family decided to [facility] care. Referral was made to [place] and patient has been accepted. He is stable for discharge today to [place]: pulmonary/intensive  care  Palliative care  Treatments: antibiotics: Cefazolin and  surgery: LLE arteriogram, thrombectomy, popliteal bypass, popliteal/tibioperoneal trunk endarterectomy with bovine pericardial patch angioplasty   Disposition: Discharge disposition: 51-Hospice/Medical [place] use ---  Post-op:  Wound infection: No  Graft infection: No  Transfusion: Yes  If yes, 1 units given New Arrhythmia: No Patency judged by: [ X] Dopper only, [ ]  Palpable graft pulse, [ ]  Palpable distal pulse, [ ]  ABI inc. > 0.15, [ ]  Duplex D/C Ambulatory Status: Ambulatory with Assistance  Complications: MI: [place] ] No, [ ]  Troponin only, [ ]  EKG or Clinical CHF: No Resp failure: [ X] none, [ ]  Pneumonia, [ ]  Ventilator Chg in renal function: [ X] none, [ ]  Inc. Cr > 0.5, [ ]  Temp. Dialysis, [ ]  Permanent dialysis Stroke: [place] ] None, [ ]  Minor, [ ]  Major Return to OR: No  Reason for return to OR: [ ]  Bleeding, [ ]  Infection, [ ]  Thrombosis, [ ]  Revision  Discharge medications: Statin use:  Yes ASA use:  Yes Plavix use:  Yes Beta blocker use: No  for medical reason not indicated Coumadin use: No  for medical reason not indicated    Patient Instructions:  Allergies as of 12/23/2019      Reactions   Atorvastatin    Leg pain   Crestor [rosuvastatin]    Leg pain      Medication List    STOP taking these medications   ferrous gluconate 324 MG tablet Commonly known as: FERGON     TAKE these medications   clopidogrel 75 MG tablet Commonly known as: PLAVIX Take 1 tablet by mouth once daily with breakfast What changed: See the new instructions.   donepezil 23 MG Tabs tablet Commonly known as: ARICEPT Take 23 mg by mouth at bedtime.   memantine 10 MG tablet Commonly known as: NAMENDA Take 1 tablet (10 mg total) by mouth 2 (two) times daily.   mirtazapine 15 MG tablet Commonly known as: REMERON Take 15 mg by mouth at bedtime.   pantoprazole 40 MG tablet Commonly known as: PROTONIX Take 1 tablet (40 mg total) by mouth daily.       Activity: activity as tolerated Diet: regular diet Wound Care: keep wound clean and dry  Signed: Avish Torry 12/23/2019 12:44 PM

## 2019-12-23 NOTE — Progress Notes (Signed)
This chaplain is present for F/U spiritual care.  The Pt. is awake talking to family when the chaplain arrived.   The chaplain accepted the family request for prayer before transport to [facility].

## 2019-12-23 NOTE — Progress Notes (Signed)
Inpatient Diabetes Program Recommendations  AACE/ADA: New Consensus Statement on Inpatient Glycemic Control (2015)  Target Ranges:  Prepandial:   less than 140 mg/dL      Peak postprandial:   less than 180 mg/dL (1-2 hours)      Critically ill patients:  140 - 180 mg/dL   Lab Results  Component Value Date   GLUCAP 129 (H) 12/23/2019   HGBA1C 6.8 (H) 12/21/2019    Review of Glycemic Control Results for Thomas Snow, Thomas Snow (MRN [id number]) as of 12/23/2019 09:25  Ref. Range 12/23/2019 00:32 12/23/2019 04:52 12/23/2019 09:17  Glucose-Capillary Latest Ref Range: 70 - 99 mg/dL 102 (H) 136 (H) 129 (H)   Current orders for Inpatient glycemic control:  Novolog 2-6 units Q4H  Inpatient Diabetes Program Recommendations:     Consider Novolog 0-6 units TID with meals as he is eating.   Will continue to follow while inpatient.  Thanks, Bronson Curb, MSN, RNC-OB Diabetes Coordinator [phone] (8a-5p)

## 2019-12-23 NOTE — Progress Notes (Signed)
Palliative Care Progress Note  Thomas Snow looks more comfortable today. Since last PM, he has had 2 doses of hydromorphone PRN, IV Tylenol q6 hours, and three doses of oral tramadol. Patient still has severe pain and yells out when you move him or touch his leg-but he denies and does not show signs of pain if he is lying still. He is lethargic but able to wake up and make eye contact-he is pleasant but confused.  I met with his wife Thomas Snow and his three children, a DIL and a SIL to discuss goals of care. I answered their questions and provided medical information about his condition and prognosis.  Recommendations:  1. DNR, Comfort Measures Only 2. Recommend a [place] for his advanced symptom management needs for pain related ischemic left foot and agitation/delirium from end-stage Vascular Dementia. 3. Prognosis: If patient continues to have poor PO intake and requires increases in pain and symptom management medications anticipate  <2 weeks. I discussed prognostic uncertainty and reassured them that the hospice team would continue to communicate with them and help plan. 4. Referral made to [facility], they have bed availability-have notified [facility] and they are in agreement with this plan and will complete discharge documentation.  Lane Hacker, DO Palliative Medicine  Time: 50 min Greater than 50%  of this time was spent counseling and coordinating care related to the above assessment and plan.

## 2020-01-02 DEATH — deceased

## 2020-01-20 ENCOUNTER — Ambulatory Visit (HOSPITAL_COMMUNITY)
Admission: RE | Admit: 2020-01-20 | Payer: PPO | Source: Ambulatory Visit | Attending: Cardiovascular Disease | Admitting: Cardiovascular Disease

## 2020-01-28 ENCOUNTER — Encounter (HOSPITAL_COMMUNITY): Payer: PPO

## 2020-02-10 ENCOUNTER — Other Ambulatory Visit: Payer: Self-pay | Admitting: Cardiovascular Disease

## 2020-02-11 ENCOUNTER — Telehealth: Payer: Self-pay | Admitting: Emergency Medicine

## 2020-02-11 NOTE — Telephone Encounter (Signed)
Appointments have been cancelled. Patient removed from [facility] and status has been changed in [place].

## 2020-02-24 ENCOUNTER — Encounter (HOSPITAL_COMMUNITY): Payer: PPO

## 2021-02-08 IMAGING — DX DG CHEST 1V PORT
1 series · 1 of 1 positions shown · non-contrast
Comparison: November 16, 2019

CLINICAL DATA: Cough.

EXAM:
PORTABLE CHEST 1 VIEW

[chest ap]
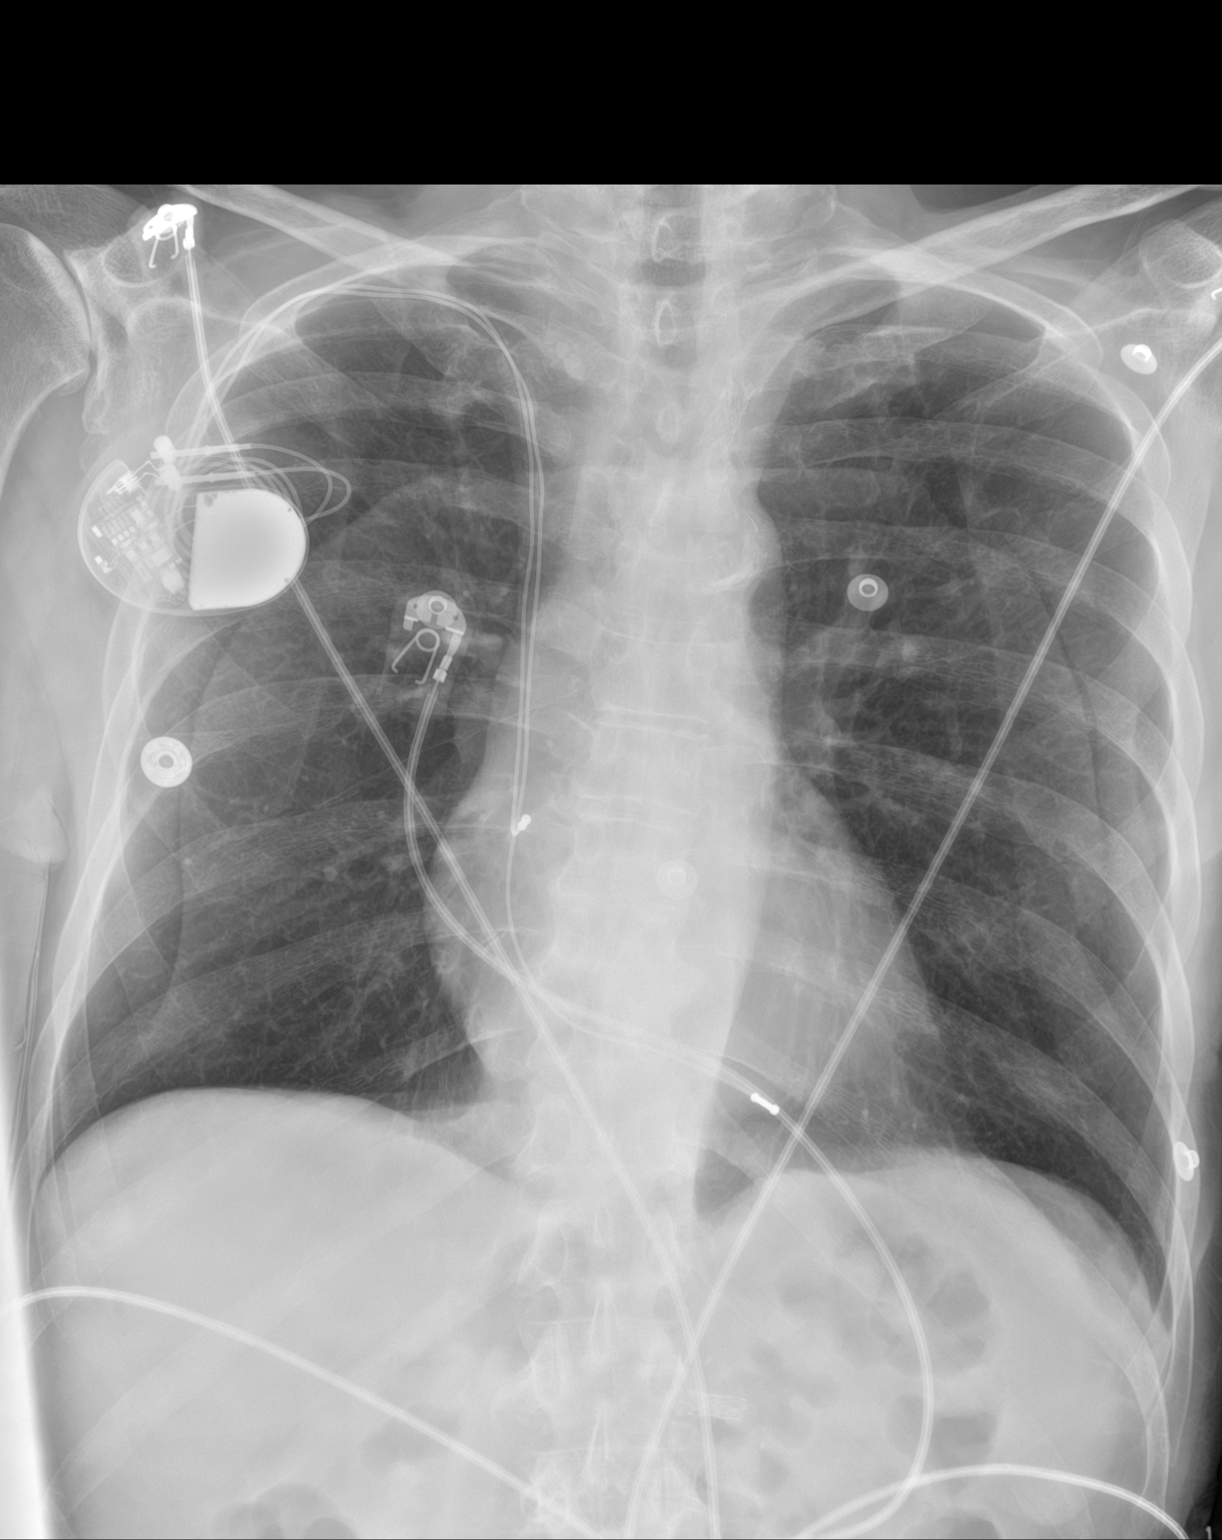

[1 of 1 positions shown; findings below may reference images not displayed]

FINDINGS: Prominent skin folds identified. No pneumothorax noted. Stable
pacemaker. The heart, hila, and mediastinum are unremarkable. No
nodules or masses. No focal infiltrates. No cause for cough
identified.
IMPRESSION: No active disease.

## 2021-02-08 IMAGING — CT CT HEAD W/O CM
4 series · 16 of 47 positions shown, 18 images · non-contrast
Comparison: Brain CT 11/16/2019.

CLINICAL DATA: Altered mental status.  Possible stroke.

EXAM:
CT HEAD WITHOUT CONTRAST
TECHNIQUE: Contiguous axial images were obtained from the base of the skull
through the vertex without intravenous contrast.

[Series 3: head without · axial · non-contrast · 0.42mm/px · z∈[+1424,+1544]mm · 7 of 33 slices shown, 9 images]
[im 5/33  brain]
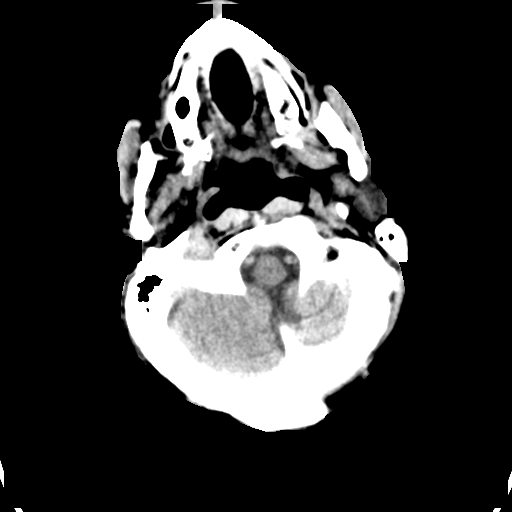
[im 5/33  bone]
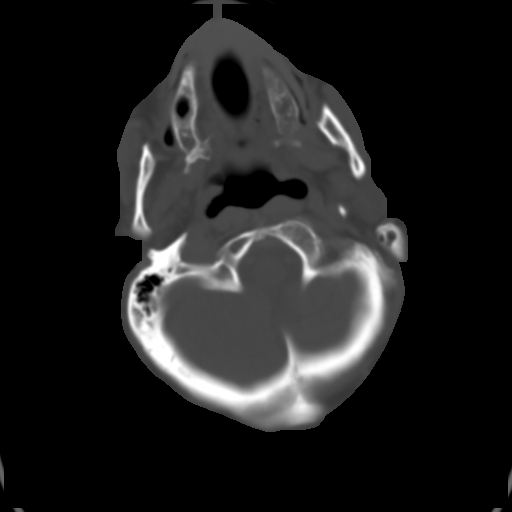
[im 9/33  brain]
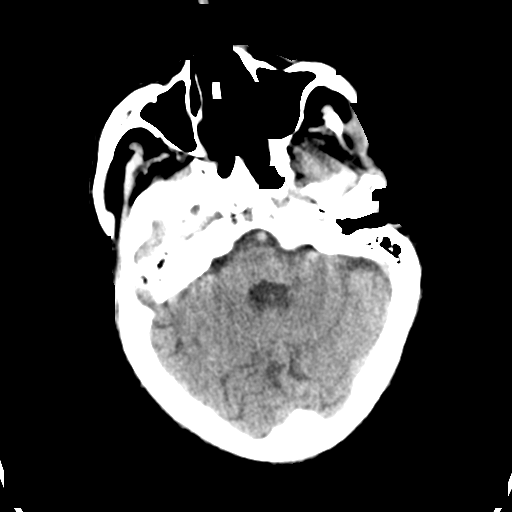
[im 13/33  brain]
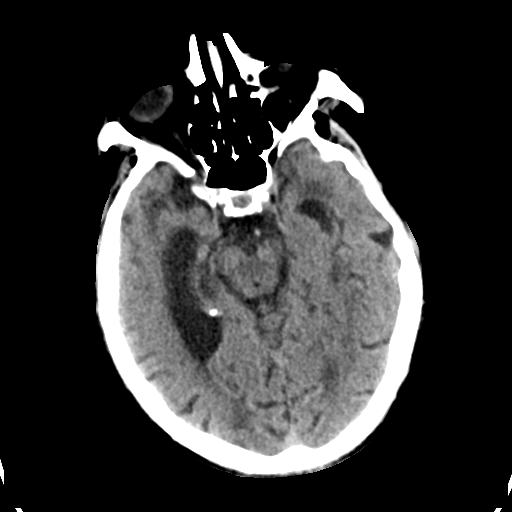
[im 17/33  brain]
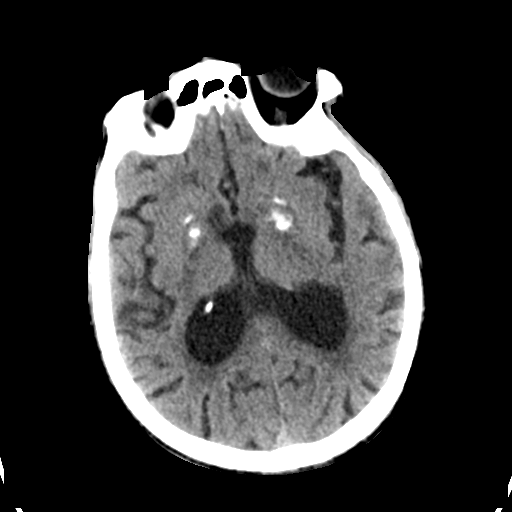
[im 21/33  brain]
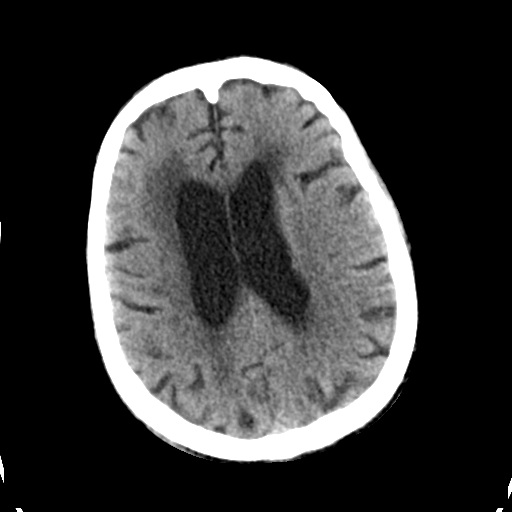
[im 21/33  bone]
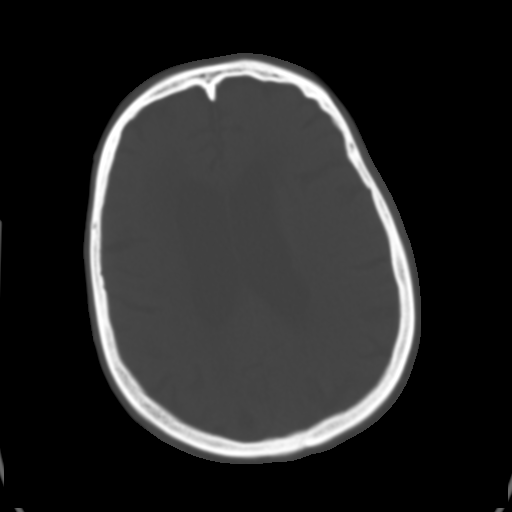
[im 25/33  brain]
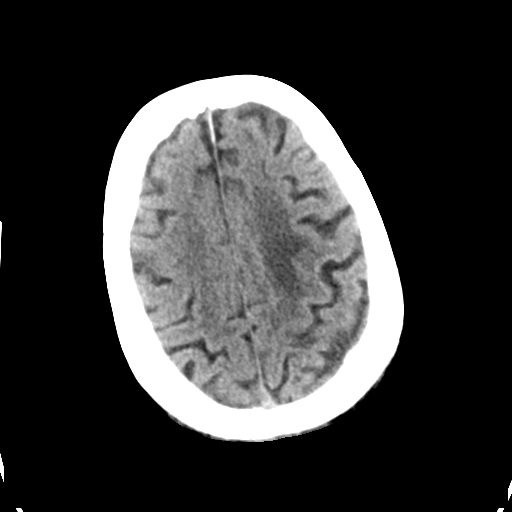
[im 29/33  brain]
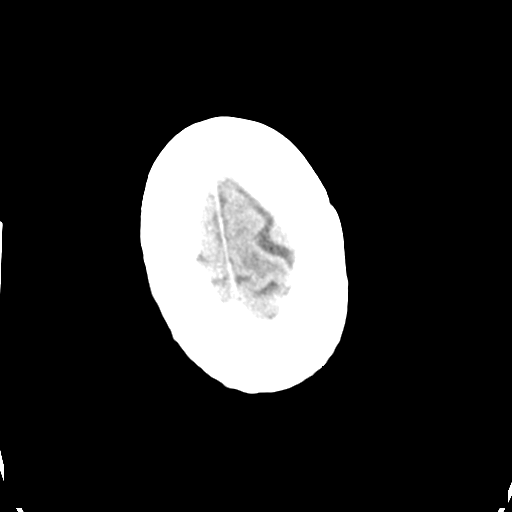

[Series 4: head bone · axial · 0.42mm/px · z∈[+1420,+1452]mm · 3 of 82 slices shown]
[im 9/82  bone]
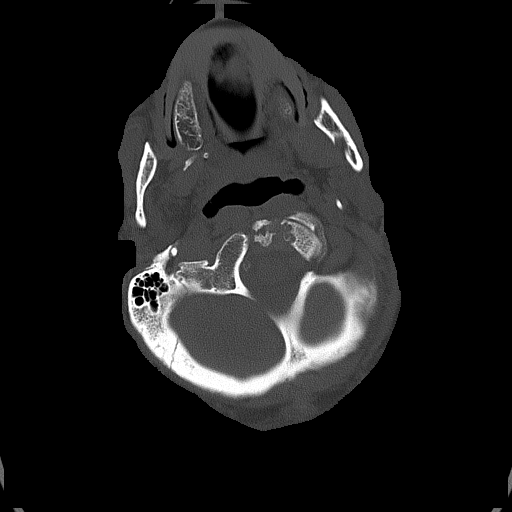
[im 17/82  bone]
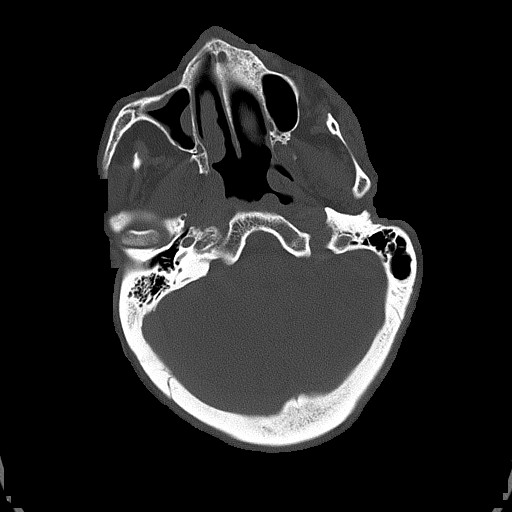
[im 25/82  bone]
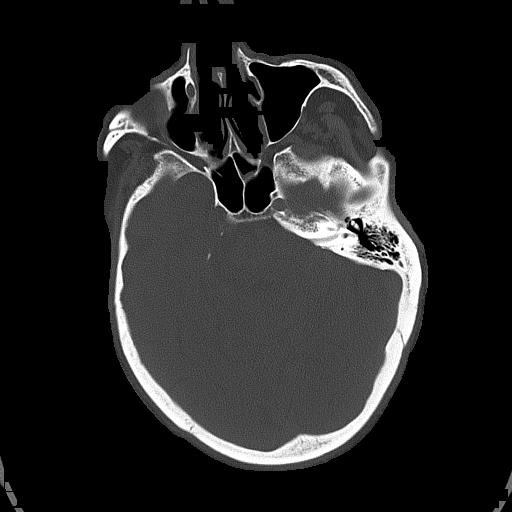

[Series 5: head without cor · coronal · non-contrast · 0.32mm/px · 3 of 68 slices shown]
[im 23/68  brain]
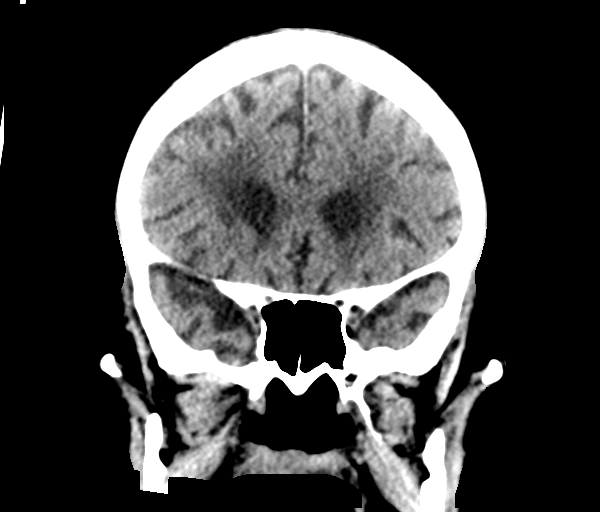
[im 30/68  brain]
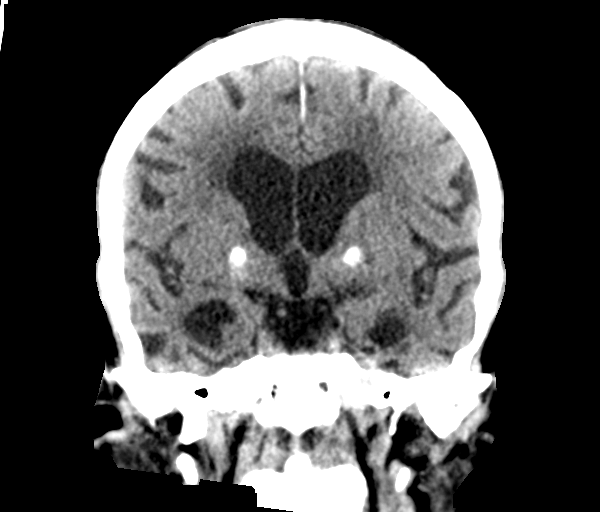
[im 38/68  brain]
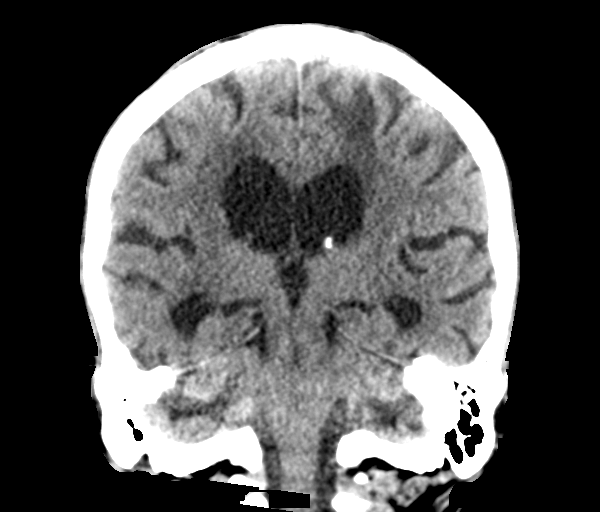

[Series 6: head without sag · sagittal · non-contrast · 0.32mm/px · 3 of 59 slices shown]
[im 20/59  brain]
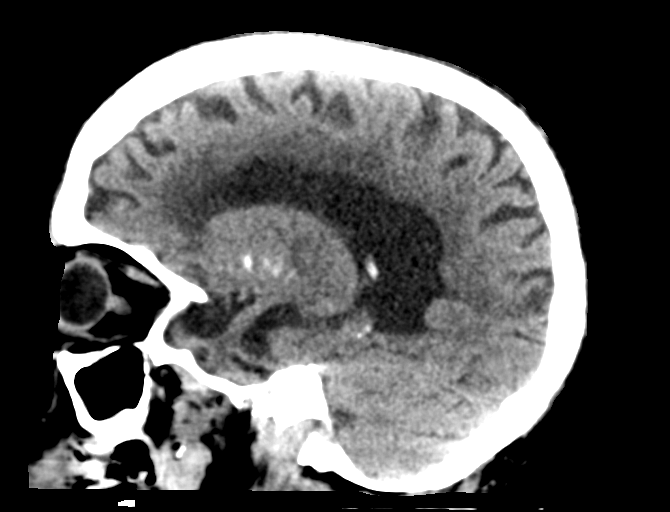
[im 30/59  brain]
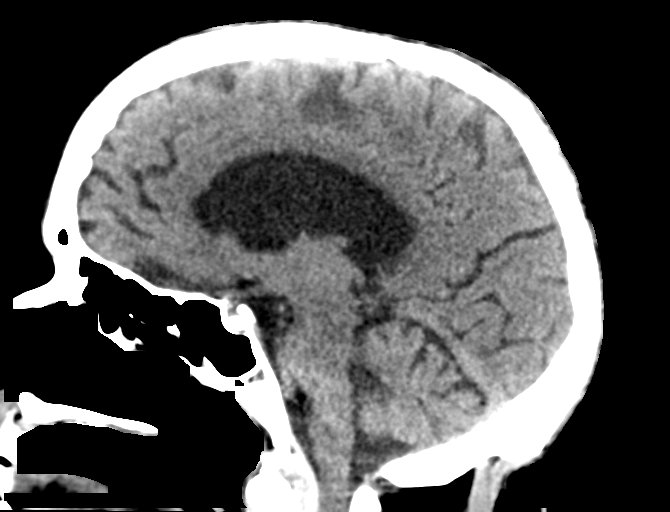
[im 39/59  brain]
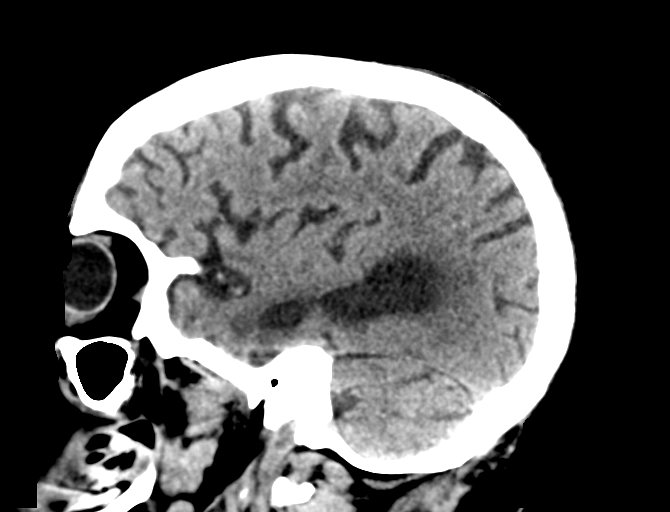

[16 of 47 positions shown; findings below may reference images not displayed]

FINDINGS: Brain: Ventricles and sulci are prominent compatible with atrophy.
Periventricular and subcortical white matter hypodensities
compatible with chronic microvascular ischemic changes. No evidence
for acute cortically based infarct, intracranial hemorrhage, mass
lesion or mass-effect. Unchanged hypodensity in the left frontal
convexity with encephalomalacia.

Vascular: Unremarkable

Skull: Intact.

Sinuses/Orbits: Mild mucosal thickening right maxillary sinus.
Remainder the paranasal sinuses are unremarkable.

Other: None.
IMPRESSION: 1. No acute intracranial process.
2. Atrophy and chronic microvascular ischemic changes.
